# Patient Record
Sex: Male | Born: 1960 | Race: White | Hispanic: No | Marital: Single | Smoking: Current every day smoker
Health system: Southern US, Community
[De-identification: ages and names within clinical notes are randomized; demographics above are authoritative.]

## PROBLEM LIST (undated history)

## (undated) DIAGNOSIS — F32A Depression, unspecified: Secondary | ICD-10-CM

## (undated) DIAGNOSIS — H9190 Unspecified hearing loss, unspecified ear: Secondary | ICD-10-CM

## (undated) DIAGNOSIS — F419 Anxiety disorder, unspecified: Secondary | ICD-10-CM

## (undated) DIAGNOSIS — F329 Major depressive disorder, single episode, unspecified: Secondary | ICD-10-CM

## (undated) DIAGNOSIS — K219 Gastro-esophageal reflux disease without esophagitis: Secondary | ICD-10-CM

## (undated) DIAGNOSIS — K649 Unspecified hemorrhoids: Secondary | ICD-10-CM

---

## 2000-06-19 ENCOUNTER — Encounter: Payer: Self-pay | Admitting: Emergency Medicine

## 2000-06-19 ENCOUNTER — Emergency Department (HOSPITAL_COMMUNITY): Admission: EM | Admit: 2000-06-19 | Discharge: 2000-06-19 | Payer: Self-pay | Admitting: Emergency Medicine

## 2000-06-20 ENCOUNTER — Encounter: Payer: Self-pay | Admitting: Emergency Medicine

## 2003-07-27 HISTORY — PX: OTHER SURGICAL HISTORY: SHX169

## 2011-07-27 HISTORY — PX: OTHER SURGICAL HISTORY: SHX169

## 2015-11-10 ENCOUNTER — Other Ambulatory Visit: Payer: Self-pay | Admitting: Surgery

## 2015-11-10 NOTE — H&P (Signed)
Tommy Page 11/10/2015 1:29 PM Location: [facility] Patient #: [id number] DOB: 1960-11-20 Single / Language: [place] / Race: White Male  History of Present Illness Ardeth Sportsman MD; 11/10/2015 2:00 PM) The patient is a 55 year old male who presents with hemorrhoids. Note for "Hemorrhoids": Patient sent by Dr. Willis Modena with [facility] gastroenterology for consultation concerning chronically prolapsed/prolapsing hemorrhoids.  Active male. History of irregular bowels with occasional constipation and diarrhea. Moisture and drainage on his underwear. Occasional blood as well. Concern by exam and colonoscopy for hemorrhoids no other major abnormalities. Surgical consultation requested to see if more definitive surgical treatment of hemorrhoids of benefit. He notes he's had hemorrhoid issues for many years. Work often makes him traveling a lot. Heart to stay compliant on a low fat high fiber diet. The greasy or richer foods Bother Him. Worsening Heartburn and Reflux. Worsening Loose Stools. He Is Transitioned His Fiber Supplement since He Saw Gastroenterology and His Bowels Are Moving Better but He Still Struggles. Especially with Leakage. Ostomy Pain Recently That Ruled Out Any Major Other Pathologies. Patient's Never Had Any Anorectal Intervention Such As Banding Surgery Treatment. Pads Often Help. Agents like Preparation H or a Prescription Anusol Has Not Really Helped Much.  No personal nor family history of GI/colon cancer, inflammatory bowel disease, irritable bowel syndrome, allergy such as Celiac Sprue, dietary/dairy problems, colitis, ulcers nor gastritis. No recent sick contacts/gastroenteritis. No travel outside the country. No changes in diet. No dysphagia to solids or liquids. No significant heartburn or reflux. No hematochezia, hematemesis, coffee ground emesis. No evidence of prior gastric/peptic ulceration.   Other Problems Fay Records, CMA;  11/10/2015 1:29 PM) Anxiety Disorder Back Pain Gastroesophageal Reflux Disease Hypercholesterolemia  Diagnostic Studies History Fay Records, CMA; 11/10/2015 1:29 PM) Colonoscopy within last year  Allergies Fay Records, CMA; 11/10/2015 1:30 PM) No Known Drug Allergies 11/10/2015  Medication History Fay Records, CMA; 11/10/2015 1:30 PM) Simvastatin (  Tablet, Oral) Active. Omeprazole (  Capsule DR, Oral) Active. Medications Reconciled  Social History Fay Records, [place]; 11/10/2015 1:29 PM) Alcohol use Moderate alcohol use. Caffeine use Carbonated beverages, Coffee. Illicit drug use Remotely quit drug use. Tobacco use Former smoker.  Family History Fay Records, [place]; 11/10/2015 1:29 PM) Alcohol Abuse Father. Arthritis Mother. Colon Cancer Father. Colon Polyps Brother, Mother. Heart Disease Father. Heart disease in male family member before age 71 Hypertension Mother. Melanoma Brother. Prostate Cancer Father.     Review of Systems Fay Records CMA; 11/10/2015 1:29 PM) General Not Present- Appetite Loss, Chills, Fatigue, Fever, Night Sweats, Weight Gain and Weight Loss. Skin Present- Change in Wart/Mole. Not Present- Dryness, Hives, Jaundice, New Lesions, Non-Healing Wounds, Rash and Ulcer. HEENT Not Present- Earache, Hearing Loss, Hoarseness, Nose Bleed, Oral Ulcers, Ringing in the Ears, Seasonal Allergies, Sinus Pain, Sore Throat, Visual Disturbances, Wears glasses/contact lenses and Yellow Eyes. Respiratory Present- Snoring. Not Present- Bloody sputum, Chronic Cough, Difficulty Breathing and Wheezing. Breast Not Present- Breast Mass, Breast Pain, Nipple Discharge and Skin Changes. Cardiovascular Not Present- Chest Pain, Difficulty Breathing Lying Down, Leg Cramps, Palpitations, Rapid Heart Rate, Shortness of Breath and Swelling of Extremities. Gastrointestinal Present- Bloody Stool and Hemorrhoids. Not Present- Abdominal Pain, Bloating, Change in Bowel  Habits, Chronic diarrhea, Constipation, Difficulty Swallowing, Excessive gas, Gets full quickly at meals, Indigestion, Nausea, Rectal Pain and Vomiting. Male Genitourinary Not Present- Blood in Urine, Change in Urinary Stream, Frequency, Impotence, Nocturia, Painful Urination, Urgency and Urine Leakage. Musculoskeletal Not Present- Back Pain, Joint Pain, Joint Stiffness, Muscle Pain, Muscle  Weakness and Swelling of Extremities. Neurological Not Present- Decreased Memory, Fainting, Headaches, Numbness, Seizures, Tingling, Tremor, Trouble walking and Weakness. Psychiatric Present- Anxiety. Not Present- Bipolar, Change in Sleep Pattern, Depression, Fearful and Frequent crying. Endocrine Not Present- Cold Intolerance, Excessive Hunger, Hair Changes, Heat Intolerance, Hot flashes and New Diabetes. Hematology Not Present- Easy Bruising, Excessive bleeding, Gland problems, HIV and Persistent Infections.  Vitals Fay Records CMA; 11/10/2015 1:31 PM) 11/10/2015 1:30 PM Weight: 174.8 lb Height: 70in Body Surface Area: 1.97 m Body Mass Index: 25.08 kg/m  Temp.: 97.67F  Pulse: 83 (Regular)  BP: 130/72 (Sitting, Left Arm, Standard)      Physical Exam Ardeth Sportsman MD; 11/10/2015 2:01 PM)  General Mental Status-Alert. General Appearance-Not in acute distress, Not Sickly. Orientation-Oriented X3. Hydration-Well hydrated. Voice-Normal.  Integumentary Global Assessment Upon inspection and palpation of skin surfaces of the - Axillae: non-tender, no inflammation or ulceration, no drainage. and Distribution of scalp and body hair is normal. General Characteristics Temperature - normal warmth is noted.  Head and Neck Head-normocephalic, atraumatic with no lesions or palpable masses. Face Global Assessment - atraumatic, no absence of expression. Neck Global Assessment - no abnormal movements, no bruit auscultated on the right, no bruit auscultated on the left, no decreased  range of motion, non-tender. Trachea-midline. Thyroid Gland Characteristics - non-tender.  Eye Eyeball - Left-Extraocular movements intact, No Nystagmus. Eyeball - Right-Extraocular movements intact, No Nystagmus. Cornea - Left-No Hazy. Cornea - Right-No Hazy. Sclera/Conjunctiva - Left-No scleral icterus, No Discharge. Sclera/Conjunctiva - Right-No scleral icterus, No Discharge. Pupil - Left-Direct reaction to light normal. Pupil - Right-Direct reaction to light normal.  ENMT Ears Pinna - Left - no drainage observed, no generalized tenderness observed. Right - no drainage observed, no generalized tenderness observed. Nose and Sinuses External Inspection of the Nose - no destructive lesion observed. Inspection of the nares - Left - quiet respiration. Right - quiet respiration. Mouth and Throat Lips - Upper Lip - no fissures observed, no pallor noted. Lower Lip - no fissures observed, no pallor noted. Nasopharynx - no discharge present. Oral Cavity/Oropharynx - Tongue - no dryness observed. Oral Mucosa - no cyanosis observed. Hypopharynx - no evidence of airway distress observed. Note: Hard of hearing  Chest and Lung Exam Inspection Movements - Normal and Symmetrical. Accessory muscles - No use of accessory muscles in breathing. Palpation Palpation of the chest reveals - Non-tender. Auscultation Breath sounds - Normal and Clear.  Cardiovascular Auscultation Rhythm - Regular. Murmurs & Other Heart Sounds - Auscultation of the heart reveals - No Murmurs and No Systolic Clicks.  Abdomen Inspection Inspection of the abdomen reveals - No Visible peristalsis and No Abnormal pulsations. Umbilicus - No Bleeding, No Urine drainage. Palpation/Percussion Palpation and Percussion of the abdomen reveal - Soft, Non Tender, No Rebound tenderness, No Rigidity (guarding) and No Cutaneous hyperesthesia. Note: Abdomen soft and flat. No diastases. No umbilical hernia.  Nontender. Nondistended.  Male Genitourinary Sexual Maturity Tanner 5 - Adult hair pattern and Adult penile size and shape. Note: Normal external genitalia. Epididymi, testes, and spermatic cords normal without any masses. No inguinal hernias.  Rectal Note: External hemorrhoids: Left lateral greater than right posterior partially prolapsed piles.  Hemorrhoidal piles: Enlarged right anterior grade 3 hemorrhoid easily prolapses. Circumferential irritation of the anal canal. No rectal masses. No condyloma.    Exam done with assistance of male Medical Assistant in the room. Perianal skin clean with good hygiene. No pruritis ani. No pilonidal disease. No fissure. No abscess/fistula. Normal sphincter  tone. Tolerates digital and anoscopic rectal exam. No rectal masses. Prostate smooth and not enlarged. No discrete masses.  Peripheral Vascular Upper Extremity Inspection - Left - No Cyanotic nailbeds, Not Ischemic. Right - No Cyanotic nailbeds, Not Ischemic.  Neurologic Neurologic evaluation reveals -normal attention span and ability to concentrate, able to name objects and repeat phrases. Appropriate fund of knowledge , normal sensation and normal coordination. Mental Status Affect - not angry, not paranoid. Cranial Nerves-Normal Bilaterally. Gait-Normal.  Neuropsychiatric Mental status exam performed with findings of-able to articulate well with normal speech/language, rate, volume and coherence, thought content normal with ability to perform basic computations and apply abstract reasoning and no evidence of hallucinations, delusions, obsessions or homicidal/suicidal ideation.  Musculoskeletal Global Assessment Spine, Ribs and Pelvis - no instability, subluxation or laxity. Right Upper Extremity - no instability, subluxation or laxity.  Lymphatic Head & Neck  General Head & Neck Lymphatics: Bilateral - Description - No Localized lymphadenopathy. Axillary  General Axillary  Region: Bilateral - Description - No Localized lymphadenopathy. Femoral & Inguinal  Generalized Femoral & Inguinal Lymphatics: Left - Description - No Localized lymphadenopathy. Right - Description - No Localized lymphadenopathy.   Results Ardeth Sportsman(Dilan Fullenwider C. Satori Krabill MD; 11/10/2015 1:59 PM) Procedures  Name Value Date Hemorrhoids Procedure Anal exam: External Hemorrhoid Bleeding Internal exam: Internal Hemorroids ( non-bleeding) prolapse Other: External hemorrhoids: Left lateral greater than right posterior partially prolapsed piles. Hemorrhoidal piles: Enlarged right anterior grade 3 hemorrhoid easily prolapses. Circumferential irritation of the anal canal. No rectal masses. No condyloma. Exam done with assistance of male Medical Assistant in the room. Perianal skin clean with good hygiene. No pruritis ani. No pilonidal disease. No fissure. No abscess/fistula. Normal sphincter tone. Tolerates digital and anoscopic rectal exam. No rectal masses. Prostate smooth and not enlarged. No discrete masses.  Performed: 11/10/2015 1:49 PM    Assessment & Plan Ardeth Sportsman(Carnie Bruemmer C. 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  I think this is too for long to treat with just injections or banding. Requires hemorrhoidal ligation and possible hemorrhoidectomy. Outpatient surgery. He is interested in proceeding.  I strongly recommend he continue on his fiber bowel regimen. He notes when he can stay compliant on that, it helps a lot. He is trying to get away from heavier greasier foods. [place] I can get his anatomy better with surgery, less likely to have any hemorrhoid problems in the future. He is motivated.  Current Plans ANOSCOPY, DIAGNOSTIC ([id number]) Pt Education  - [facility] Hemorrhoids (Reggie Welge): discussed with patient and provided information. Pt Education - Pamphlet Given - The Hemorrhoid Book: discussed with patient and provided information. You are being scheduled for surgery - Our schedulers will call you.  You should hear from our office's scheduling department within 5 working days about the location, date, and time of surgery. We try to make accommodations for patient's preferences in scheduling surgery, but sometimes the OR schedule or the surgeon's schedule prevents us from making those accommodations.  If you have not heard from our office [phone]) in 5 working days, call the office and ask for your surgeon's nurse.  If you have other questions about your diagnosis, plan, or surgery, call the office and ask for your surgeon's nurse.  The anatomy & physiology of the anorectal region was discussed. The pathophysiology of hemorrhoids and differential diagnosis was discussed.  Natural history risks without surgery was discussed. I stressed the importance of a bowel regimen to have daily soft bowel movements to minimize progression of disease. Interventions such as sclerotherapy & banding were discussed.  The patient's symptoms are not adequately controlled by medicines and other non-operative treatments. I feel the risks & problems of no surgery outweigh the operative risks; therefore, I recommended surgery to treat the hemorrhoids by ligation, pexy, and possible resection.  Risks such as bleeding, infection, urinary difficulties, need for further treatment, heart attack, death, and other risks were discussed. I noted a good likelihood this will help address the problem. Goals of post-operative recovery were discussed as well. Possibility that this will not correct all symptoms was explained. Post-operative pain, bleeding, constipation, and other problems after surgery were discussed. We will work to minimize complications. Educational handouts further  explaining the pathology, treatment options, and bowel regimen were given as well. Questions were answered. The patient expresses understanding & wishes to proceed with surgery.  Pt Education - [facility] Rectal Prep for Anorectal outpatient/office surgery: discussed with patient and provided information. Pt Education - [facility] Rectal Surgery HCI (Kael Keetch): discussed with patient and provided information.  Ardeth Sportsman, M.D., F.A.C.S. Gastrointestinal and Minimally Invasive Surgery [facility] [address], [address] [place], [place] [address] [phone] Main / Paging

## 2015-11-27 ENCOUNTER — Encounter (HOSPITAL_COMMUNITY)
Admission: RE | Admit: 2015-11-27 | Discharge: 2015-11-27 | Disposition: A | Discharge status: Home / Self Care | Payer: BLUE CROSS/BLUE SHIELD | Source: Ambulatory Visit | Attending: Surgery | Admitting: Surgery

## 2015-11-27 ENCOUNTER — Encounter (HOSPITAL_COMMUNITY): Payer: Self-pay

## 2015-11-27 DIAGNOSIS — Z01812 Encounter for preprocedural laboratory examination: Secondary | ICD-10-CM | POA: Insufficient documentation

## 2015-11-27 DIAGNOSIS — K643 Fourth degree hemorrhoids: Secondary | ICD-10-CM | POA: Diagnosis present

## 2015-11-27 HISTORY — DX: Gastro-esophageal reflux disease without esophagitis: K21.9

## 2015-11-27 HISTORY — DX: Unspecified hearing loss, unspecified ear: H91.90

## 2015-11-27 HISTORY — DX: Anxiety disorder, unspecified: F41.9

## 2015-11-27 HISTORY — DX: Major depressive disorder, single episode, unspecified: F32.9

## 2015-11-27 HISTORY — DX: Depression, unspecified: F32.A

## 2015-11-27 HISTORY — DX: Unspecified hemorrhoids: K64.9

## 2015-11-27 LAB — HEMOGLOBIN: Hemoglobin: 15.4 g/dL (ref 13.0–17.0)

## 2015-11-27 NOTE — Patient Instructions (Addendum)
Tommy ParishVincent Page  11/27/2015   Your procedure is scheduled on:   [facility]  [facility] to 3rd floor to  Short Stay Center at 1000 AM.  Call this number if you have problems the morning of surgery [phone]   Remember: ONLY 1 PERSON MAY GO WITH YOU TO SHORT STAY TO GET  READY MORNING OF YOUR SURGERY.  Do not eat food or drink liquids :After Midnight.PER DR GROSS DAY BEFORE SURGERY 12-02-15 SWITCH TO DRINKING LIQUIDS OR PUREED FOODS ONLY,  AT 100PM TAKE 2 OUNCES (4 TABLESPOONS) OF MILK OF MAGNESIA. AT 300 PM TAKE 2 OUNCES (4 TABLESPOONS MILK OF MAGNESIA).       Take these medicines the morning of surgery with A SIP OF WATER: OMEPRAZOLE             You may not have any metal on your body including hair pins and              piercings  Do not wear jewelry, make-up, lotions, powders or perfumes, deodorant             Do not wear nail polish.  Do not shave  48 hours prior to surgery.              Men may shave face and neck.   Do not bring valuables to the hospital. [name] IS NOT             RESPONSIBLE   FOR VALUABLES.  Contacts, dentures or bridgework may not be worn into surgery.  Leave suitcase in the car. After surgery it may be brought to your room.     Patients discharged the day of surgery will not be allowed to drive home.  Name and phone number of your driver:LORI [facility] CE[facility] [phone] OR DAVID Cheryle HorsfallLEVEQUE FRIEND CELL [phone]  Special Instructions: N/A              Please read over the following fact sheets you were given: _____________________________________________________________________             [facility] - Pre[facility] Before surgery, you can play an important role.  Because skin is not sterile, your skin needs to be as free of germs as possible.  You can reduce the number of germs on your skin by washing with CHG (chlorahexidine gluconate) soap before surgery.  CHG is an antiseptic cleaner  which kills germs and bonds with the skin to continue killing germs even after washing. Please DO NOT use if you have an allergy to CHG or antibacterial soaps.  If your skin becomes reddened/irritated stop using the CHG and inform your nurse when you arrive at Short Stay. Do not shave (including legs and underarms) for at least 48 hours prior to the first CHG shower.  You may shave your face/neck. Please follow these instructions carefully:  1.  Shower with CHG Soap the night before surgery and the  morning of Surgery.  2.  If you choose to wash your hair, wash your hair first as usual with your  normal  shampoo.  3.  After you shampoo, rinse your hair and body thoroughly to remove the  shampoo.                           4.  Use CHG as you  would any other liquid soap.  You can apply chg directly  to the skin and wash                       Gently with a scrungie or clean washcloth.  5.  Apply the CHG Soap to your body ONLY FROM THE NECK DOWN.   Do not use on face/ open                           Wound or open sores. Avoid contact with eyes, ears mouth and genitals (private parts).                       Wash face,  Genitals (private parts) with your normal soap.             6.  Wash thoroughly, paying special attention to the area where your surgery  will be performed.  7.  Thoroughly rinse your body with warm water from the neck down.  8.  DO NOT shower/wash with your normal soap after using and rinsing off  the CHG Soap.                9.  Pat yourself dry with a clean towel.            10.  Wear clean pajamas.            11.  Place clean sheets on your bed the night of your first shower and do not  sleep with pets. Day of Surgery : Do not apply any lotions/deodorants the morning of surgery.  Please wear clean clothes to the hospital/surgery center.  FAILURE TO FOLLOW THESE INSTRUCTIONS MAY RESULT IN THE CANCELLATION OF YOUR SURGERY PATIENT SIGNATURE_________________________________  NURSE  SIGNATURE__________________________________  ________________________________________________________________________

## 2015-11-27 NOTE — Progress Notes (Signed)
CMET, LIPID PANEL 11-20-15 [place] ON CHART

## 2015-12-03 ENCOUNTER — Ambulatory Visit (HOSPITAL_COMMUNITY): Payer: BLUE CROSS/BLUE SHIELD | Admitting: Certified Registered"

## 2015-12-03 ENCOUNTER — Encounter (HOSPITAL_COMMUNITY)
Admission: RE | Disposition: A | Discharge status: Home / Self Care | Payer: Self-pay | Source: Ambulatory Visit | Attending: Surgery

## 2015-12-03 ENCOUNTER — Encounter (HOSPITAL_COMMUNITY): Payer: Self-pay | Admitting: *Deleted

## 2015-12-03 ENCOUNTER — Ambulatory Visit (HOSPITAL_COMMUNITY)
Admission: RE | Admit: 2015-12-03 | Discharge: 2015-12-03 | Disposition: A | Discharge status: Home / Self Care | Payer: BLUE CROSS/BLUE SHIELD | Source: Ambulatory Visit | Attending: Surgery | Admitting: Surgery

## 2015-12-03 DIAGNOSIS — K643 Fourth degree hemorrhoids: Secondary | ICD-10-CM | POA: Diagnosis not present

## 2015-12-03 DIAGNOSIS — K648 Other hemorrhoids: Secondary | ICD-10-CM | POA: Insufficient documentation

## 2015-12-03 DIAGNOSIS — K642 Third degree hemorrhoids: Secondary | ICD-10-CM | POA: Insufficient documentation

## 2015-12-03 DIAGNOSIS — K219 Gastro-esophageal reflux disease without esophagitis: Secondary | ICD-10-CM | POA: Diagnosis not present

## 2015-12-03 DIAGNOSIS — E7800 Pure hypercholesterolemia, unspecified: Secondary | ICD-10-CM | POA: Insufficient documentation

## 2015-12-03 DIAGNOSIS — Z87891 Personal history of nicotine dependence: Secondary | ICD-10-CM | POA: Insufficient documentation

## 2015-12-03 DIAGNOSIS — Z79899 Other long term (current) drug therapy: Secondary | ICD-10-CM | POA: Diagnosis not present

## 2015-12-03 HISTORY — PX: TRANSANAL HEMORRHOIDAL DEARTERIALIZATION: SHX6136

## 2015-12-03 SURGERY — TRANSANAL HEMORRHOIDAL DEARTERIALIZATION
Anesthesia: General | Site: Rectum

## 2015-12-03 MED ORDER — HYDROCORTISONE 2.5 % RE CREA: 1.0000 "application " | TOPICAL_CREAM | Freq: Two times a day (BID) | RECTAL | Status: AC

## 2015-12-03 MED ORDER — KETOROLAC TROMETHAMINE 30 MG/ML IJ SOLN
INTRAMUSCULAR | Status: AC
  Filled 2015-12-03: qty 1

## 2015-12-03 MED ORDER — 0.9 % SODIUM CHLORIDE (POUR BTL) OPTIME
TOPICAL | Status: DC | PRN
  Administered 2015-12-03: 1000 mL

## 2015-12-03 MED ORDER — LIDOCAINE HCL (CARDIAC) 20 MG/ML IV SOLN
INTRAVENOUS | Status: DC | PRN
  Administered 2015-12-03: 100 mg via INTRAVENOUS

## 2015-12-03 MED ORDER — ROCURONIUM BROMIDE 100 MG/10ML IV SOLN
INTRAVENOUS | Status: DC | PRN
  Administered 2015-12-03: 50 mg via INTRAVENOUS

## 2015-12-03 MED ORDER — MIDAZOLAM HCL 2 MG/2ML IJ SOLN
INTRAMUSCULAR | Status: AC
  Filled 2015-12-03: qty 2

## 2015-12-03 MED ORDER — DEXAMETHASONE SODIUM PHOSPHATE 10 MG/ML IJ SOLN
INTRAMUSCULAR | Status: AC
  Filled 2015-12-03: qty 1

## 2015-12-03 MED ORDER — LIDOCAINE HCL (CARDIAC) 20 MG/ML IV SOLN
INTRAVENOUS | Status: AC
  Filled 2015-12-03: qty 5

## 2015-12-03 MED ORDER — BUPIVACAINE-EPINEPHRINE 0.25% -1:200000 IJ SOLN
INTRAMUSCULAR | Status: AC
  Filled 2015-12-03: qty 1

## 2015-12-03 MED ORDER — OXYCODONE HCL 5 MG/5ML PO SOLN
5.0000 mg | Freq: Once | ORAL | Status: AC | PRN
  Filled 2015-12-03: qty 5

## 2015-12-03 MED ORDER — FENTANYL CITRATE (PF) 100 MCG/2ML IJ SOLN
INTRAMUSCULAR | Status: DC | PRN
  Administered 2015-12-03 (×2): 50 ug via INTRAVENOUS
  Administered 2015-12-03: 100 ug via INTRAVENOUS

## 2015-12-03 MED ORDER — ROCURONIUM BROMIDE 50 MG/5ML IV SOLN
INTRAVENOUS | Status: AC
  Filled 2015-12-03: qty 1

## 2015-12-03 MED ORDER — PROPOFOL 10 MG/ML IV BOLUS
INTRAVENOUS | Status: DC | PRN
  Administered 2015-12-03: 200 mg via INTRAVENOUS

## 2015-12-03 MED ORDER — MIDAZOLAM HCL 5 MG/5ML IJ SOLN
INTRAMUSCULAR | Status: DC | PRN
  Administered 2015-12-03: 2 mg via INTRAVENOUS

## 2015-12-03 MED ORDER — PROPOFOL 10 MG/ML IV BOLUS
INTRAVENOUS | Status: AC
  Filled 2015-12-03: qty 20

## 2015-12-03 MED ORDER — ONDANSETRON HCL 4 MG/2ML IJ SOLN: 4.0000 mg | Freq: Once | INTRAMUSCULAR | Status: DC | PRN

## 2015-12-03 MED ORDER — DEXAMETHASONE SODIUM PHOSPHATE 10 MG/ML IJ SOLN
INTRAMUSCULAR | Status: DC | PRN
  Administered 2015-12-03: 10 mg via INTRAVENOUS

## 2015-12-03 MED ORDER — BUPIVACAINE LIPOSOME 1.3 % IJ SUSP
INTRAMUSCULAR | Status: DC | PRN
  Administered 2015-12-03: 20 mL

## 2015-12-03 MED ORDER — CHLORHEXIDINE GLUCONATE 4 % EX LIQD: 1.0000 "application " | Freq: Once | CUTANEOUS | Status: DC

## 2015-12-03 MED ORDER — DIBUCAINE 1 % RE OINT
TOPICAL_OINTMENT | RECTAL | Status: AC
  Filled 2015-12-03: qty 28

## 2015-12-03 MED ORDER — METRONIDAZOLE IN NACL 5-0.79 MG/ML-% IV SOLN
500.0000 mg | INTRAVENOUS | Status: AC
  Administered 2015-12-03: 500 mg via INTRAVENOUS

## 2015-12-03 MED ORDER — ONDANSETRON HCL 4 MG/2ML IJ SOLN
INTRAMUSCULAR | Status: DC | PRN
  Administered 2015-12-03 (×4): 2 mg via INTRAVENOUS

## 2015-12-03 MED ORDER — SUGAMMADEX SODIUM 200 MG/2ML IV SOLN
INTRAVENOUS | Status: AC
  Filled 2015-12-03: qty 2

## 2015-12-03 MED ORDER — FENTANYL CITRATE (PF) 100 MCG/2ML IJ SOLN
INTRAMUSCULAR | Status: AC
  Filled 2015-12-03: qty 2

## 2015-12-03 MED ORDER — LACTATED RINGERS IV SOLN
INTRAVENOUS | Status: DC
  Administered 2015-12-03 (×2): via INTRAVENOUS

## 2015-12-03 MED ORDER — FENTANYL CITRATE (PF) 100 MCG/2ML IJ SOLN
25.0000 ug | INTRAMUSCULAR | Status: DC | PRN
  Administered 2015-12-03 (×2): 50 ug via INTRAVENOUS

## 2015-12-03 MED ORDER — CEFAZOLIN SODIUM-DEXTROSE 2-4 GM/100ML-% IV SOLN
INTRAVENOUS | Status: AC
  Filled 2015-12-03: qty 100

## 2015-12-03 MED ORDER — CEFAZOLIN SODIUM-DEXTROSE 2-4 GM/100ML-% IV SOLN
2.0000 g | INTRAVENOUS | Status: AC
  Administered 2015-12-03: 2 g via INTRAVENOUS

## 2015-12-03 MED ORDER — SUGAMMADEX SODIUM 200 MG/2ML IV SOLN
INTRAVENOUS | Status: DC | PRN
  Administered 2015-12-03: 150 mg via INTRAVENOUS

## 2015-12-03 MED ORDER — METRONIDAZOLE IN NACL 5-0.79 MG/ML-% IV SOLN
INTRAVENOUS | Status: AC
  Filled 2015-12-03: qty 100

## 2015-12-03 MED ORDER — ONDANSETRON HCL 4 MG/2ML IJ SOLN
INTRAMUSCULAR | Status: AC
  Filled 2015-12-03: qty 4

## 2015-12-03 MED ORDER — OXYCODONE HCL 5 MG PO TABS
5.0000 mg | ORAL_TABLET | Freq: Once | ORAL | Status: AC | PRN
  Administered 2015-12-03: 5 mg via ORAL
  Filled 2015-12-03: qty 1

## 2015-12-03 MED ORDER — DIBUCAINE 1 % RE OINT
TOPICAL_OINTMENT | RECTAL | Status: DC | PRN
  Administered 2015-12-03: 1 via RECTAL

## 2015-12-03 MED ORDER — BUPIVACAINE LIPOSOME 1.3 % IJ SUSP
20.0000 mL | INTRAMUSCULAR | Status: DC
  Filled 2015-12-03: qty 20

## 2015-12-03 MED ORDER — KETOROLAC TROMETHAMINE 30 MG/ML IJ SOLN
INTRAMUSCULAR | Status: DC | PRN
  Administered 2015-12-03: 30 mg via INTRAVENOUS

## 2015-12-03 MED ORDER — OXYCODONE HCL 5 MG PO TABS: 5.0000 mg | ORAL_TABLET | ORAL | Status: DC | PRN

## 2015-12-03 SURGICAL SUPPLY — 30 items
BLADE SURG 15 STRL LF DISP TIS (BLADE) IMPLANT
BLADE SURG 15 STRL SS (BLADE) ×2
BRIEF STRETCH FOR OB PAD LRG (UNDERPADS AND DIAPERS) ×2 IMPLANT
COVER SURGICAL LIGHT HANDLE (MISCELLANEOUS) ×2 IMPLANT
DECANTER SPIKE VIAL GLASS SM (MISCELLANEOUS) ×2 IMPLANT
DRAPE LAPAROTOMY T 102X78X121 (DRAPES) ×2 IMPLANT
DRSG PAD ABDOMINAL 8X10 ST (GAUZE/BANDAGES/DRESSINGS) ×1 IMPLANT
ELECT PENCIL ROCKER SW 15FT (MISCELLANEOUS) ×1 IMPLANT
ELECT REM PT RETURN 9FT ADLT (ELECTROSURGICAL) ×2
ELECTRODE REM PT RTRN 9FT ADLT (ELECTROSURGICAL) ×1 IMPLANT
GAUZE SPONGE 4X4 12PLY STRL (GAUZE/BANDAGES/DRESSINGS) ×1 IMPLANT
GAUZE SPONGE 4X4 16PLY XRAY LF (GAUZE/BANDAGES/DRESSINGS) ×2 IMPLANT
GLOVE BIOGEL PI IND STRL 8 (GLOVE) ×1 IMPLANT
GLOVE BIOGEL PI INDICATOR 8 (GLOVE) ×1
GLOVE ECLIPSE 8.0 STRL XLNG CF (GLOVE) ×2 IMPLANT
GOWN STRL REUS W/TWL XL LVL3 (GOWN DISPOSABLE) ×5 IMPLANT
KIT BASIN OR (CUSTOM PROCEDURE TRAY) ×2 IMPLANT
KIT SLIDE ONE PROLAPS HEMORR (KITS) ×2 IMPLANT
LUBRICANT JELLY K Y 4OZ (MISCELLANEOUS) ×2 IMPLANT
NEEDLE HYPO 22GX1.5 SAFETY (NEEDLE) ×2 IMPLANT
PACK BASIC VI WITH GOWN DISP (CUSTOM PROCEDURE TRAY) ×2 IMPLANT
PAD ABD 8X10 STRL (GAUZE/BANDAGES/DRESSINGS) ×1 IMPLANT
SPONGE LAP 18X18 X RAY DECT (DISPOSABLE) ×1 IMPLANT
SUT CHROMIC 3 0 SH 27 (SUTURE) ×1 IMPLANT
SUT VIC AB 2-0 UR6 27 (SUTURE) IMPLANT
SYR 20CC LL (SYRINGE) ×2 IMPLANT
SYR BULB IRRIGATION 50ML (SYRINGE) ×1 IMPLANT
TOWEL OR 17X26 10 PK STRL BLUE (TOWEL DISPOSABLE) ×2 IMPLANT
TOWEL OR NON WOVEN STRL DISP B (DISPOSABLE) ×2 IMPLANT
YANKAUER SUCT BULB TIP 10FT TU (MISCELLANEOUS) ×2 IMPLANT

## 2015-12-03 NOTE — Transfer of Care (Signed)
Immediate Anesthesia Transfer of Care Note  Patient: Tommy Page  Procedure(s) Performed: Procedure(s): TRANSANAL HEMORRHOIDAL DEARTERIALIZATION LIGATION/PEXY  EUA HEMORRHOIDECTOMY  (N/A)  Patient Location: PACU  Anesthesia Type:General  Level of Consciousness:  sedated, patient cooperative and responds to stimulation  Airway & Oxygen Therapy:Patient Spontanous Breathing and Patient connected to face mask oxgen  Post-op Assessment:  Report given to PACU RN and Post -op Vital signs reviewed and stable  Post vital signs:  Reviewed and stable  Last Vitals:  Filed Vitals:   12/03/15 0957  BP: 116/81  Pulse: 64  Temp: 36.5 C  Resp: 18    Complications: No apparent anesthesia complications

## 2015-12-03 NOTE — Anesthesia Procedure Notes (Signed)
Procedure Name: Intubation Date/Time: 12/03/2015 1:08 PM Performed by: Army FossaPULLIAM, Marlo Arriola DANE Pre-anesthesia Checklist: Patient identified, Suction available, Emergency Drugs available, Patient being monitored and Timeout performed Patient Re-evaluated:Patient Re-evaluated prior to inductionOxygen Delivery Method: Circle system utilized Preoxygenation: Pre-oxygenation with 100% oxygen Intubation Type: IV induction Ventilation: Mask ventilation without difficulty and Oral airway inserted - appropriate to patient size Laryngoscope Size: Mac and 4 Grade View: Grade I Tube type: Oral Number of attempts: 1 Airway Equipment and Method: Patient positioned with wedge pillow and Stylet Placement Confirmation: ETT inserted through vocal cords under direct vision,  positive ETCO2,  CO2 detector and breath sounds checked- equal and bilateral Secured at: 23 cm Tube secured with: Tape Dental Injury: Teeth and Oropharynx as per pre-operative assessment

## 2015-12-03 NOTE — Op Note (Signed)
12/03/2015  2:02 PM  PATIENT:  Loletta ParishVincent Stoiber  55 y.o. male  Patient Care Team: Farris HasAaron Morrow, MD as PCP - General (Family Medicine) Karie SodaSteven Iliyana Convey, MD as Consulting Physician (General Surgery) Willis ModenaWilliam Outlaw, MD as Consulting Physician (Gastroenterology)  PRE-OPERATIVE DIAGNOSIS:  Grade 3 and 4 prolapsed hemorrhoids with drainage, pain and bleeding   POST-OPERATIVE DIAGNOSIS:  Grade 3 and 4 prolapsed hemorrhoids with drainage, pain and bleeding   PROCEDURE:  Procedure(s): TRANSANAL HEMORRHOIDAL DEARTERIALIZATION LIGATION/PEXY Transanal examination under anesthesia.  External hemorrhoidectomy 3    SURGEON:  Surgeon(s): Karie SodaSteven Love Chowning, MD  ASSISTANT: none   ANESTHESIA:   Local field block Anorectal block General  0.25% bupivacaine with epinephrine at the beginning of the case.  Liposomal bupivacaine (Experel) at the end of the case.  EBL:  Total I/O In: 1000 [I.V.:1000] Out: 75 [Blood:75]  Delay start of Pharmacological VTE agent (>24hrs) due to surgical blood loss or risk of bleeding:  no  DRAINS: none   SPECIMEN:  No Specimen and Source of Specimen:  External hemorrhoidal tags.  (Right anterior, right posterior, left lateral)  DISPOSITION OF SPECIMEN:  PATHOLOGY  COUNTS:  YES  PLAN OF CARE: Discharge to home after PACU  PATIENT DISPOSITION:  PACU - hemodynamically stable.  INDICATION: Pleasant active male struggling with worsening hemorrhoid problems.  Chronically prolapsed.  Felt not to be amenable to office intervention given the significant prolapse.  I recommended examination under anesthesia.  Hemorrhoidal ligation and pexy using THD system.  Probable external hemorrhoidectomy of remaining tissues.  The anatomy & physiology of the anorectal region was discussed.  The pathophysiology of hemorrhoids and differential diagnosis was discussed.  Natural history risks without surgery was discussed.   I stressed the importance of a bowel regimen to have daily soft  bowel movements to minimize progression of disease.  Interventions such as sclerotherapy & banding were discussed.  The patient's symptoms are not adequately controlled by medicines and other non-operative treatments.  I feel the risks & problems of no surgery outweigh the operative risks; therefore, I recommended surgery to treat the hemorrhoids by ligation, pexy, and possible resection.  Risks such as bleeding, infection, urinary difficulties, need for further treatment, heart attack, death, and other risks were discussed.   I noted a good likelihood this will help address the problem.  Goals of post-operative recovery were discussed as well.  Possibility that this will not correct all symptoms was explained.  Post-operative pain, bleeding, constipation, and other problems after surgery were discussed.  We will work to minimize complications.   Educational handouts further explaining the pathology, treatment options, and bowel regimen were given as well.  Questions were answered.  The patient expresses understanding & wishes to proceed with surgery.   OR FINDINGS: Had chronically prolapsed grade 4 hemorrhoids 3 piles.  Right anterior, right posterior, left lateral.  Internal hemorrhoids ligated and pexy.  Remaining external hemorrhoidal sections removed.  DESCRIPTION:   Informed consent was confirmed. Patient underwent general anesthesia without difficulty. Patient was placed into prone positioning.  The perianal region was prepped and draped in sterile fashion. Surgical time-out confirmed our plan.  I did digital rectal examination and then transitioned over to anoscopy to get a sense of the anatomy.  I switched over to the [facility] fiber[facility] lit Doppler anocope.   Using the Doppler on the tip of the THD anoscope, I identified the arterial hemorrhoidal vessels coming in in the classic hexagonal anatomical pattern  (right posterior/lateral/anterior, left posterior /lateral/anterior).    I  proceeded to ligate the hemorrhoidal arteries. I used a 2-0 Vicryl suture on a UR-6 needle in a figure-of-eight fashion over the signal around 6 cm proximal to the anal verge. I then ran that stitch longitudinally more distally to the white line of [place]. I then tied that stitch down to cause a hemorrhoidopexy. I did that for all 6 locations.    I redid Doppler anoscopy.  Signals had gone away.  There is no more prolapsing.  However there were still some external hemorrhoidal tissue.  I excised them in all 3 locations in a fusiform radial/longitudinal fashion.  I closed those wounds over a large Hill-Ferguson retractor with running 4-0 chromic suture, leaving a few millimeters opened distally to let it drain.  I repeated anoscopy and examination.  Hemostasis was good.  There was no stricturing.  There is no necrosis.  Patient is being extubated go to recovery room.  I had discussed postop care in detail with the patient in the preop holding area.  Instructions are written.  There is no family currently available at this time.  I will try again later.      Ardeth Sportsman, M.D., F.A.C.S. Gastrointestinal and Minimally Invasive Surgery [facility] [address] [place], [place] [address] [phone] Main / Paging

## 2015-12-03 NOTE — Progress Notes (Addendum)
Patient ambulated from 1301 to 1309 after rectal surgery. Tolerated well.  1650   Patient up to bathroom several times and unable to void. Bladder scanned for 298 ml. RN informs patient that he should be able to feel the sensation to void soon.

## 2015-12-03 NOTE — Discharge Instructions (Signed)
ANORECTAL SURGERY:  POST OPERATIVE INSTRUCTIONS  1. Take your usually prescribed home medications unless otherwise directed. 2. DIET: Follow a light bland diet the first 24 hours after arrival home, such as soup, liquids, crackers, etc.  Be sure to include lots of fluids daily.  Avoid fast food or heavy meals as your are more likely to get nauseated.  Eat a low fat the next few days after surgery.   3. PAIN CONTROL: a. Pain is best controlled by a usual combination of three different methods TOGETHER: i. Ice/Heat ii. Over the counter pain medication iii. Prescription pain medication b. Most patients will experience some swelling and discomfort in the anus/rectal area. and incisions.  Ice packs or heat (30-60 minutes up to 6 times a day) will help. Use ice for the first few days to help decrease swelling and bruising, then switch to heat such as warm towels, sitz baths, warm baths, etc to help relax tight/sore spots and speed recovery.  Some people prefer to use ice alone, heat alone, alternating between ice & heat.  Experiment to what works for you.  Swelling and bruising can take several weeks to resolve.   c. It is helpful to take an over-the-counter pain medication regularly for the first few weeks.  Choose one of the following that works best for you: i. Naproxen (Aleve, etc)  Two '220mg'$  tabs twice a day ii. Ibuprofen (Advil, etc) Three '200mg'$  tabs four times a day (every meal & bedtime) iii. Acetaminophen (Tylenol, etc) 500-'650mg'$  four times a day (every meal & bedtime) d. A  prescription for pain medication (such as oxycodone, hydrocodone, etc) should be given to you upon discharge.  Take your pain medication as prescribed.  i. If you are having problems/concerns with the prescription medicine (does not control pain, nausea, vomiting, rash, itching, etc), please call us [phone] to see if we need to switch you to a different pain medicine that will work better for you and/or control your  side effect better. ii. If you need a refill on your pain medication, please contact your pharmacy.  They will contact our office to request authorization. Prescriptions will not be filled after 5 pm or on week-ends.  Use a Sitz Bath 4-8 times a day for relief   [employer] A sitz bath is a warm water bath taken in the sitting position that covers only the hips and buttocks. It may be used for either healing or hygiene purposes. Sitz baths are also used to relieve pain, itching, or muscle spasms. The water may contain medicine. Moist heat will help you heal and relax.  HOME CARE INSTRUCTIONS  Take 3 to 4 sitz baths a day.  Fill the bathtub half full with warm water.  Sit in the water and open the drain a little.  Turn on the warm water to keep the tub half full. Keep the water running constantly.  Soak in the water for 15 to 20 minutes.  After the sitz bath, pat the affected area dry first.   4. KEEP YOUR BOWELS REGULAR a. The goal is one bowel movement a day b. Avoid getting constipated.  Between the surgery and the pain medications, it is common to experience some constipation.  Increasing fluid intake and taking a fiber supplement (such as Metamucil, Citrucel, FiberCon, MiraLax, etc) 1-2 times a day regularly will usually help prevent this problem from occurring.  A mild laxative (prune juice, Milk of Magnesia, MiraLax, etc) should be taken according to package  directions if there are no bowel movements after 48 hours. c. Watch out for diarrhea.  If you have many loose bowel movements, simplify your diet to bland foods & liquids for a few days.  Stop any stool softeners and decrease your fiber supplement.  Switching to mild anti-diarrheal medications (Kayopectate, Pepto Bismol) can help.  If this worsens or does not improve, please call us.  5. Wound Care  a. Remove your bandages the day after surgery.  Unless discharge instructions indicate otherwise, leave your bandage dry and in  place overnight.  Remove the bandage during your first bowel movement.   b. Wear an absorbent pad or soft cotton gauze in your underwear as needed to catch any drainage and help keep the area  c. Keep the area clean and dry.  Bathe / shower every day.  Keep the area clean by showering / bathing over the incision / wound.   It is okay to soak an open wound to help wash it.  Wet wipes or showers / gentle washing after bowel movements is often less traumatic than regular toilet paper. d. Dennis Bast will often notice bleeding with bowel movements.  This should slow down by the end of the first week of surgery e. Expect some drainage.  This should slow down, too, by the end of the first week of surgery.  Wear an absorbent pad or soft cotton gauze in your underwear until the drainage stops.  6. ACTIVITIES as tolerated:   a. You may resume regular (light) daily activities beginning the next day--such as daily self-care, walking, climbing stairs--gradually increasing activities as tolerated.  If you can walk 30 minutes without difficulty, it is safe to try more intense activity such as jogging, treadmill, bicycling, low-impact aerobics, swimming, etc. b. Save the most intensive and strenuous activity for last such as sit-ups, heavy lifting, contact sports, etc  Refrain from any heavy lifting or straining until you are off narcotics for pain control.   c. DO NOT PUSH THROUGH PAIN.  Let pain be your guide: If it hurts to do something, don't do it.  Pain is your body warning you to avoid that activity for another week until the pain goes down. d. You may drive when you are no longer taking prescription pain medication, you can comfortably sit for long periods of time, and you can safely maneuver your car and apply brakes. e. Dennis Bast may have sexual intercourse when it is comfortable.  7. FOLLOW UP in our office a. Please call [facility] at [phone] to set up an appointment to see your surgeon in the office for a follow-up  appointment approximately 2 weeks after your surgery. b. Make sure that you call for this appointment the day you arrive home to insure a convenient appointment time. 10. IF YOU HAVE DISABILITY OR FAMILY LEAVE FORMS, BRING THEM TO THE OFFICE FOR PROCESSING.  DO NOT GIVE THEM TO YOUR DOCTOR.        WHEN TO CALL us [phone]: 1. Poor pain control 2. Reactions / problems with new medications (rash/itching, nausea, etc)  3. Fever over 101.5 F (38.5 C) 4. Inability to urinate 5. Nausea and/or vomiting 6. Worsening swelling or bruising 7. Continued bleeding from incision. 8. Increased pain, redness, or drainage from the incision  The clinic staff is available to answer your questions during regular business hours (8:30am-5pm).  Please dont hesitate to call and ask to speak to one of our nurses for clinical concerns.   A  surgeon from [facility] is always on call at the hospitals   If you have a medical emergency, go to the nearest emergency room or call 911.    [facility] [address], [address], [place], [place]  [id number] ? MAIN: [phone] ? TOLL FREE: [phone] ? FAX [phone] [email or web address]     HEMORRHOIDS  The rectum is the last foot of your colon, and it naturally stretches to hold stool.  Hemorrhoidal piles are natural clusters of blood vessels that help the rectum and anal canal stretch to hold stool and allow bowel movements to eliminate feces.   Hemorrhoids are abnormally swollen blood vessels in the rectum.  Too much pressure in the rectum causes hemorrhoids by forcing blood to stretch and bulge the walls of the veins, sometimes even rupturing them.  Hemorrhoids can become like varicose veins you might see on a person's legs.  Most people will develop a flare of hemorrhoids in their lifetime.  When bulging hemorrhoidal veins are irritated, they can swell, burn, itch, cause pain, and bleed.  Most flares  will calm down gradually own within a few weeks.  However, once hemorrhoids are created, they are difficult to get rid of completely and tend to flare more easily than the first flare.   Fortunately, good habits and simple medical treatment usually control hemorrhoids well, and surgery is needed only in severe cases. Types of Hemorrhoids:  Internal hemorrhoids usually don't initially hurt or itch; they are deep inside the rectum and usually have no sensation. If they begin to push out (prolapse), pain and burning can occur.  However, internal hemorrhoids can bleed.  Anal bleeding should not be ignored since bleeding could come from a dangerous source like colorectal cancer, so persistent rectal bleeding should be investigated by a doctor, sometimes with a colonoscopy.  External hemorrhoids cause most of the symptoms - pain, burning, and itching. Nonirritated hemorrhoids can look like small skin tags coming out of the anus.   Thrombosed hemorrhoids can form when a hemorrhoid blood vessel bursts and causes the hemorrhoid to suddenly swell.  A purple blood clot can form in it and become an excruciatingly painful lump at the anus. Because of these unpleasant symptoms, immediate incision and drainage by a surgeon at an office visit can provide much relief of the pain.    PREVENTION Avoiding the most frequent causes listed below will prevent most cases of hemorrhoids: Constipation Hard stools Diarrhea  Constant sitting  Straining with bowel movements Sitting on the toilet for a long time  Severe coughing  episodes Pregnancy / Childbirth  Heavy Lifting  Sometimes avoiding the above triggers is difficult:  How can you avoid sitting all day if you have a seated job? Also, we try to avoid coughing and diarrhea, but sometimes its beyond your control.  Still, there are some practical hints to help: Keep the anal and genital area clean.  Moistened tissues such as flushable wet wipes are less irritating than  toilet paper.  Using irrigating showers or bottle irrigation washing gently cleans this sensitive area.   Avoid dry toilet paper when cleaning after bowel movements.  Marland Kitchen. Keep the anal and genital area dry.  Lightly pat the rectal area dry.  Avoid rubbing.  Talcum or baby powders can help GET YOUR STOOLS SOFT.   This is the most important way to prevent irritated hemorrhoids.  Hard stools are like sandpaper to the anorectal canal and will cause more problems.  The goal: ONE SOFT BOWEL MOVEMENT A DAY!  BMs from every other day to 3 times a day is a tolerable range Treat coughing, diarrhea and constipation early since irritated hemorrhoids may soon follow.  If your main job activity is seated, always stand or walk during your breaks. Make it a point to stand and walk at least 5 minutes every hour and try to shift frequently in your chair to avoid direct rectal pressure.  Always exhale as you strain or lift. Don't hold your breath.  Do not delay or try to prevent a bowel movement when the urge is present. Exercise regularly (walking or jogging 60 minutes a day) to stimulate the bowels to move. No reading or other activity while on the toilet. If bowel movements take longer than 5 minutes, you are too constipated. AVOID CONSTIPATION Drink plenty of liquids (1 1/2 to 2 quarts of water and other fluids a day unless fluid restricted for another medical condition). Liquids that contain caffeine (coffee a, tea, soft drinks) can be dehydrating and should be avoided until constipation is controlled. Consider minimizing milk, as dairy products may be constipating. Eat plenty of fiber (30g a day ideal, more if needed).  Fiber is the undigested part of plant food that passes into the colon, acting as natures broom to encourage bowel motility and movement.  Fiber can absorb and hold large amounts of water. This results in a larger, bulkier stool, which is soft and easier to pass.  Eating foods high in fiber - 12  servings - such as  Vegetables: Root (potatoes, carrots, turnips), Leafy green (lettuce, salad greens, celery, spinach), High residue (cabbage, broccoli, etc.) Fruit: Fresh, Dried (prunes, apricots, cherries), Stewed (applesauce)  Whole grain breads, pasta, whole wheat Bran cereals, muffins, etc. Consider adding supplemental bulking fiber which retains large volumes of water: Psyllium ground seeds (native plant from [place])--available as Metamucil, Konsyl, Effersyllium, Per Diem Fiber, or the less expensive generic forms.  Citrucel  (methylcellulose wood fiber) . FiberCon (Polycarbophil) Polyethylene Glycol - and artificial fiber commonly called Miralax or Glycolax.  It is helpful for people with gassy or bloated feelings with regular fiber Flax Seed - a less gassy natural fiber  Laxatives can be useful for a short period if constipation is severe Osmotics (Milk of Magnesia, Fleets Phospho-Soda, Magnesium Citrate)  Stimulants (Senokot,   Castor Oil,  Dulcolax, Ex-Lax)    Laxatives are not a good long-term solution as it can stress the bowels and cause too much mineral loss and dehydration.   Avoid taking laxatives for more than 7 days in a row.  AVOID DIARRHEA Switch to liquids and simpler foods for a few days to avoid stressing your intestines further. Avoid dairy products (especially milk & ice cream) for a short time.  The intestines often can lose the ability to digest lactose when stressed. Avoid foods that cause gassiness or bloating.  Typical foods include beans and other legumes, cabbage, broccoli, and dairy foods.  Every person has some sensitivity to other foods, so listen to your body and avoid those foods that trigger problems for you. Adding fiber (Citrucel, Metamucil, FiberCon, Flax seed, Miralax) gradually can help thicken stools by absorbing excess fluid and retrain the intestines to act more normally.  Slowly increase the dose over a few weeks.  Too much fiber too soon  can backfire and cause cramping & bloating. Probiotics (such as active yogurt, Align, etc) may help repopulate the intestines and colon with normal bacteria and calm  down a sensitive digestive tract.  Most studies show it to be of mild help, though, and such products can be costly. Medicines: Bismuth subsalicylate (ex. Kayopectate, Pepto Bismol) every 30 minutes for up to 6 doses can help control diarrhea.  Avoid if pregnant. Loperamide (Immodium) can slow down diarrhea.  Start with two tablets (4mg  total) first and then try one tablet every 6 hours.  Avoid if you are having fevers or severe pain.  If you are not better or start feeling worse, stop all medicines and call your doctor for advice Call your doctor if you are getting worse or not better.  Sometimes further testing (cultures, endoscopy, X-ray studies, bloodwork, etc) may be needed to help diagnose and treat the cause of the diarrhea.  TROUBLESHOOTING IRREGULAR BOWELS 1) Avoid extremes of bowel movements (no bad constipation/diarrhea) 2) Miralax 17gm mixed in 8oz. water or juice-daily. May use BID as needed.  3) Gas-x,Phazyme, etc. as needed for gas & bloating.  4) Soft,bland diet. No spicy,greasy,fried foods.  5) Prilosec over-the-counter as needed  6) May hold gluten/wheat products from diet to see if symptoms improve.  7)  May try probiotics (Align, Activa, etc) to help calm the bowels down 7) If symptoms become worse call back immediately.   TREATMENT OF HEMORRHOID FLARE If these preventive measures fail, you must take action right away! Hemorrhoids are one condition that can be mild in the morning and become intolerable by nightfall. Most hemorrhoidal flares take several weeks to calm down.  These suggestions can help: Warm soaks.  This helps more than any topical medication.  Use up to 8 times a day.  Usually sitz baths or sitting in a warm bathtub helps.  Sitting on moist warm towels are helpful.  Switching to ice packs/cool  compresses can be helpful  Use a Sitz Bath 4-8 times a day for relief A sitz bath is a warm water bath taken in the sitting position that covers only the hips and buttocks. It may be used for either healing or hygiene purposes. Sitz baths are also used to relieve pain, itching, or muscle spasms. The water may contain medicine. Moist heat will help you heal and relax.  HOME CARE INSTRUCTIONS  Take 3 to 4 sitz baths a day.  Fill the bathtub half full with warm water.  Sit in the water and open the drain a little.  Turn on the warm water to keep the tub half full. Keep the water running constantly.  Soak in the water for 15 to 20 minutes.  After the sitz bath, pat the affected area dry first. SEEK MEDICAL CARE IF:  You get worse instead of better. Stop the sitz baths if you get worse.  Normalize your bowels.  Extremes of diarrhea or constipation will make hemorrhoids worse.  One soft bowel movement a day is the goal.  Fiber can help get your bowels regular Wet wipes instead of toilet paper Pain control with a NSAID such as ibuprofen (Advil) or naproxen (Aleve) or acetaminophen (Tylenol) around the clock.  Narcotics are constipating and should be minimized if possible Topical creams contain steroids (bydrocortisone) or local anesthetic (xylocaine) can help make pain and itching more tolerable.   EVALUATION If hemorrhoids are still causing problems, you could benefit by an evaluation by a surgeon.  The surgeon will obtain a history and examine you.  If hemorrhoids are diagnosed, some therapies can be offered in the office, usually with an anoscope into the less sensitive area of  the rectum: -injection of hemorrhoids (sclerotherapy) can scar the blood vessels of the swollen/enlarged hemorrhoids to help shrink them down to a more normal size -rubber banding of the enlarged hemorrhoids to help shrink them down to a more normal size -drainage of the blood clot causing a thrombosed hemorrhoid,  to  relieve the severe pain   While 90% of the time such problems from hemorrhoids can be managed without preceding to surgery, sometimes the hemorrhoids require a operation to control the problem (uncontrolled bleeding, prolapse, pain, etc.).   This involves being placed under general anesthesia where the surgeon can confirm the diagnosis and remove, suture, or staple the hemorrhoid(s).  Your surgeon can help you treat the problem appropriately.    GETTING TO GOOD BOWEL HEALTH. Irregular bowel habits such as constipation and diarrhea can lead to many problems over time.  Having one soft bowel movement a day is the most important way to prevent further problems.  The anorectal canal is designed to handle stretching and feces to safely manage our ability to get rid of solid waste (feces, poop, stool) out of our body.  BUT, hard constipated stools can act like ripping concrete bricks and diarrhea can be a burning fire to this very sensitive area of our body, causing inflamed hemorrhoids, anal fissures, increasing risk is perirectal abscesses, abdominal pain/bloating, an making irritable bowel worse.      The goal: ONE SOFT BOWEL MOVEMENT A DAY!  To have soft, regular bowel movements:   Drink plenty of fluids, consider 4-6 tall glasses of water a day.    Take plenty of fiber.  Fiber is the undigested part of plant food that passes into the colon, acting s natures broom to encourage bowel motility and movement.  Fiber can absorb and hold large amounts of water. This results in a larger, bulkier stool, which is soft and easier to pass. Work gradually over several weeks up to 6 servings a day of fiber (25g a day even more if needed) in the form of: o Vegetables -- Root (potatoes, carrots, turnips), leafy green (lettuce, salad greens, celery, spinach), or cooked high residue (cabbage, broccoli, etc) o Fruit -- Fresh (unpeeled skin & pulp), Dried (prunes, apricots, cherries, etc ),  or stewed ( applesauce)   o Whole grain breads, pasta, etc (whole wheat)  o Bran cereals   Bulking Agents -- This type of water-retaining fiber generally is easily obtained each day by one of the following:  o Psyllium bran -- The psyllium plant is remarkable because its ground seeds can retain so much water. This product is available as Metamucil, Konsyl, Effersyllium, Per Diem Fiber, or the less expensive generic preparation in drug and health food stores. Although labeled a laxative, it really is not a laxative.  o Methylcellulose -- This is another fiber derived from wood which also retains water. It is available as Citrucel. o Polyethylene Glycol - and artificial fiber commonly called Miralax or Glycolax.  It is helpful for people with gassy or bloated feelings with regular fiber o Flax Seed - a less gassy fiber than psyllium  No reading or other relaxing activity while on the toilet. If bowel movements take longer than 5 minutes, you are too constipated  AVOID CONSTIPATION.  High fiber and water intake usually takes care of this.  Sometimes a laxative is needed to stimulate more frequent bowel movements, but   Laxatives are not a good long-term solution as it can wear the colon out.  They can  help jump-start bowels if constipated, but should be relied on constantly without discussing with your doctor o Osmotics (Milk of Magnesia, Fleets phosphosoda, Magnesium citrate, MiraLax, GoLytely) are safer than  o Stimulants (Senokot, Castor Oil, Dulcolax, Ex Lax)    o Avoid taking laxatives for more than 7 days in a row.   IF SEVERELY CONSTIPATED, try a Bowel Retraining Program: o Do not use laxatives.  o Eat a diet high in roughage, such as bran cereals and leafy vegetables.  o Drink six (6) ounces of prune or apricot juice each morning.  o Eat two (2) large servings of stewed fruit each day.  o Take one (1) heaping tablespoon of a psyllium-based bulking agent twice a day. Use sugar-free sweetener when possible to  avoid excessive calories.  o Eat a normal breakfast.  o Set aside 15 minutes after breakfast to sit on the toilet, but do not strain to have a bowel movement.  o If you do not have a bowel movement by the third day, use an enema and repeat the above steps.   Controlling diarrhea o Switch to liquids and simpler foods for a few days to avoid stressing your intestines further. o Avoid dairy products (especially milk & ice cream) for a short time.  The intestines often can lose the ability to digest lactose when stressed. o Avoid foods that cause gassiness or bloating.  Typical foods include beans and other legumes, cabbage, broccoli, and dairy foods.  Every person has some sensitivity to other foods, so listen to our body and avoid those foods that trigger problems for you. o Adding fiber (Citrucel, Metamucil, psyllium, Miralax) gradually can help thicken stools by absorbing excess fluid and retrain the intestines to act more normally.  Slowly increase the dose over a few weeks.  Too much fiber too soon can backfire and cause cramping & bloating. o Probiotics (such as active yogurt, Align, etc) may help repopulate the intestines and colon with normal bacteria and calm down a sensitive digestive tract.  Most studies show it to be of mild help, though, and such products can be costly. o Medicines: - Bismuth subsalicylate (ex. Kayopectate, Pepto Bismol) every 30 minutes for up to 6 doses can help control diarrhea.  Avoid if pregnant. - Loperamide (Immodium) can slow down diarrhea.  Start with two tablets (  total) first and then try one tablet every 6 hours.  Avoid if you are having fevers or severe pain.  If you are not better or start feeling worse, stop all medicines and call your doctor for advice o Call your doctor if you are getting worse or not better.  Sometimes further testing (cultures, endoscopy, X-ray studies, bloodwork, etc) may be needed to help diagnose and treat the cause of the  diarrhea.  TROUBLESHOOTING IRREGULAR BOWELS 1) Avoid extremes of bowel movements (no bad constipation/diarrhea) 2) Miralax 17gm mixed in 8oz. water or juice-daily. May use BID as needed.  3) Gas-x,Phazyme, etc. as needed for gas & bloating.  4) Soft,bland diet. No spicy,greasy,fried foods.  5) Prilosec over-the-counter as needed  6) May hold gluten/wheat products from diet to see if symptoms improve.  7)  May try probiotics (Align, Activa, etc) to help calm the bowels down 7) If symptoms become worse call back immediately.  Managing Pain  Pain after surgery or related to activity is often due to strain/injury to muscle, tendon, nerves and/or incisions.  This pain is usually short-term and will improve in a few months.   Many  people find it helpful to do the following things TOGETHER to help speed the process of healing and to get back to regular activity more quickly:  1. Avoid heavy physical activity at first a. No lifting greater than 20 pounds at first, then increase to lifting as tolerated over the next few weeks b. Do not push through the pain.  Listen to your body and avoid positions and maneuvers than reproduce the pain.  Wait a few days before trying something more intense c. Walking is okay as tolerated, but go slowly and stop when getting sore.  If you can walk 30 minutes without stopping or pain, you can try more intense activity (running, jogging, aerobics, cycling, swimming, treadmill, sex, sports, weightlifting, etc ) d. Remember: If it hurts to do it, then dont do it!  2. Take Anti-inflammatory medication a. Choose ONE of the following over-the-counter medications: i.            Acetaminophen 500mg  tabs (Tylenol) 1-2 pills with every meal and just before bedtime (avoid if you have liver problems) ii.            Naproxen 220mg  tabs (ex. Aleve) 1-2 pills twice a day (avoid if you have kidney, stomach, IBD, or bleeding problems) iii. Ibuprofen 200mg  tabs (ex. Advil, Motrin)  3-4 pills with every meal and just before bedtime (avoid if you have kidney, stomach, IBD, or bleeding problems) b. Take with food/snack around the clock for 1-2 weeks i. This helps the muscle and nerve tissues become less irritable and calm down faster  3. Use a Heating pad or Ice/Cold Pack a. 4-6 times a day b. May use warm bath/hottub  or showers  4. Try Gentle Massage and/or Stretching  a. at the area of pain many times a day b. stop if you feel pain - do not overdo it  Try these steps together to help you body heal faster and avoid making things get worse.  Doing just one of these things may not be enough.    If you are not getting better after two weeks or are noticing you are getting worse, contact our office for further advice; we may need to re-evaluate you & see what other things we can do to help.   General Anesthesia, Adult, Care After Refer to this sheet in the next few weeks. These instructions provide you with information on caring for yourself after your procedure. Your health care provider may also give you more specific instructions. Your treatment has been planned according to current medical practices, but problems sometimes occur. Call your health care provider if you have any problems or questions after your procedure. WHAT TO EXPECT AFTER THE PROCEDURE After the procedure, it is typical to experience:  Sleepiness.  Nausea and vomiting. HOME CARE INSTRUCTIONS  For the first 24 hours after general anesthesia:  Have a responsible person with you.  Do not drive a car. If you are alone, do not take public transportation.  Do not drink alcohol.  Do not take medicine that has not been prescribed by your health care provider.  Do not sign important papers or make important decisions.  You may resume a normal diet and activities as directed by your health care provider.  Change bandages (dressings) as directed.  If you have questions or problems that seem  related to general anesthesia, call the hospital and ask for the anesthetist or anesthesiologist on call. SEEK MEDICAL CARE IF:  You have nausea and vomiting that continue  the day after anesthesia.  You develop a rash. SEEK IMMEDIATE MEDICAL CARE IF:   You have difficulty breathing.  You have chest pain.  You have any allergic problems.   This information is not intended to replace advice given to you by your health care provider. Make sure you discuss any questions you have with your health care provider.   Document Released: 10/18/2000 Document Revised: 08/02/2014 Document Reviewed: 11/10/2011 Elsevier Interactive Patient Education [employer]

## 2015-12-03 NOTE — Anesthesia Postprocedure Evaluation (Signed)
Anesthesia Post Note  Patient: Tommy Page  Procedure(s) Performed: Procedure(s) (LRB): TRANSANAL HEMORRHOIDAL DEARTERIALIZATION LIGATION/PEXY  EUA HEMORRHOIDECTOMY  (N/A)  Patient location during evaluation: PACU Anesthesia Type: General Level of consciousness: awake and alert Pain management: pain level controlled Vital Signs Assessment: post-procedure vital signs reviewed and stable Respiratory status: spontaneous breathing, nonlabored ventilation, respiratory function stable and patient connected to nasal cannula oxygen Cardiovascular status: blood pressure returned to baseline and stable Postop Assessment: no signs of nausea or vomiting Anesthetic complications: no    Last Vitals:  Filed Vitals:   12/03/15 0957 12/03/15 1413  BP: 116/81 140/93  Pulse: 64 84  Temp: 36.5 C 36.3 C  Resp: 18 16    Last Pain:  Filed Vitals:   12/03/15 1422  PainSc: 0-No pain                 Reino KentJudd, Dominic Mahaney J

## 2015-12-03 NOTE — Interval H&P Note (Signed)
History and Physical Interval Note:  12/03/2015 12:24 PM  Tommy Page  has presented today for surgery, with the diagnosis of Grade 3 and 4 prolapsed hemorrhoids with drainage, pain and bleeding   The various methods of treatment have been discussed with the patient and family. After consideration of risks, benefits and other options for treatment, the patient has consented to  Procedure(s): TRANSANAL HEMORRHOIDAL DEARTERIALIZATION LIGATION/PEXY  EUA POSSIBLE HEMORRHOIDECTOMY  (N/A) as a surgical intervention .  The patient's history has been reviewed, patient examined, no change in status, stable for surgery.  I have reviewed the patient's chart and labs.  Questions were answered to the patient's satisfaction.     [name] C.

## 2015-12-03 NOTE — H&P (View-Only) (Signed)
Tommy Page 11/10/2015 1:29 PM Location: [facility] Patient #: [id number] DOB: 10/27/1960 Single / Language: English / Race: White Male  History of Present Illness (Aryn Kops C. Wing Gfeller MD; 11/10/2015 2:00 PM) The patient is a 54 year old male who presents with hemorrhoids. Note for "Hemorrhoids": Patient sent by Dr. William outlaw with [facility] for consultation concerning chronically prolapsed/prolapsing hemorrhoids.  Active male. History of irregular bowels with occasional constipation and diarrhea. Moisture and drainage on his underwear. Occasional blood as well. Concern by exam and colonoscopy for hemorrhoids no other major abnormalities. Surgical consultation requested to see if more definitive surgical treatment of hemorrhoids of benefit. He notes he's had hemorrhoid issues for many years. Work often makes him traveling a lot. Heart to stay compliant on a low fat high fiber diet. The greasy or richer foods Bother Him. Worsening Heartburn and Reflux. Worsening Loose Stools. He Is Transitioned His Fiber Supplement since He Saw Gastroenterology and His Bowels Are Moving Better but He Still Struggles. Especially with Leakage. Ostomy Pain Recently That Ruled Out Any Major Other Pathologies. Patient's Never Had Any Anorectal Intervention Such As Banding Surgery Treatment. Pads Often Help. Agents like Preparation H or a Prescription Anusol Has Not Really Helped Much.  No personal nor family history of GI/colon cancer, inflammatory bowel disease, irritable bowel syndrome, allergy such as Celiac Sprue, dietary/dairy problems, colitis, ulcers nor gastritis. No recent sick contacts/gastroenteritis. No travel outside the country. No changes in diet. No dysphagia to solids or liquids. No significant heartburn or reflux. No hematochezia, hematemesis, coffee ground emesis. No evidence of prior gastric/peptic ulceration.   Other Problems (Ashley Beck, CMA;  11/10/2015 1:29 PM) Anxiety Disorder Back Pain Gastroesophageal Reflux Disease Hypercholesterolemia  Diagnostic Studies History (Ashley Beck, CMA; 11/10/2015 1:29 PM) Colonoscopy within last year  Allergies (Ashley Beck, CMA; 11/10/2015 1:30 PM) No Known Drug Allergies 11/10/2015  Medication History (Ashley Beck, CMA; 11/10/2015 1:30 PM) Simvastatin (5MG Tablet, Oral) Active. Omeprazole (10MG Capsule DR, Oral) Active. Medications Reconciled  Social History (Ashley Beck, CMA; 11/10/2015 1:29 PM) Alcohol use Moderate alcohol use. Caffeine use Carbonated beverages, Coffee. Illicit drug use Remotely quit drug use. Tobacco use Former smoker.  Family History (Ashley Beck, CMA; 11/10/2015 1:29 PM) Alcohol Abuse Father. Arthritis Mother. Colon Cancer Father. Colon Polyps Brother, Mother. Heart Disease Father. Heart disease in male family member before age 55 Hypertension Mother. Melanoma Brother. Prostate Cancer Father.     Review of Systems (Ashley Beck CMA; 11/10/2015 1:29 PM) General Not Present- Appetite Loss, Chills, Fatigue, Fever, Night Sweats, Weight Gain and Weight Loss. Skin Present- Change in Wart/Mole. Not Present- Dryness, Hives, Jaundice, New Lesions, Non-Healing Wounds, Rash and Ulcer. HEENT Not Present- Earache, Hearing Loss, Hoarseness, Nose Bleed, Oral Ulcers, Ringing in the Ears, Seasonal Allergies, Sinus Pain, Sore Throat, Visual Disturbances, Wears glasses/contact lenses and Yellow Eyes. Respiratory Present- Snoring. Not Present- Bloody sputum, Chronic Cough, Difficulty Breathing and Wheezing. Breast Not Present- Breast Mass, Breast Pain, Nipple Discharge and Skin Changes. Cardiovascular Not Present- Chest Pain, Difficulty Breathing Lying Down, Leg Cramps, Palpitations, Rapid Heart Rate, Shortness of Breath and Swelling of Extremities. Gastrointestinal Present- Bloody Stool and Hemorrhoids. Not Present- Abdominal Pain, Bloating, Change in Bowel  Habits, Chronic diarrhea, Constipation, Difficulty Swallowing, Excessive gas, Gets full quickly at meals, Indigestion, Nausea, Rectal Pain and Vomiting. Male Genitourinary Not Present- Blood in Urine, Change in Urinary Stream, Frequency, Impotence, Nocturia, Painful Urination, Urgency and Urine Leakage. Musculoskeletal Not Present- Back Pain, Joint Pain, Joint Stiffness, Muscle Pain, Muscle   Weakness and Swelling of Extremities. Neurological Not Present- Decreased Memory, Fainting, Headaches, Numbness, Seizures, Tingling, Tremor, Trouble walking and Weakness. Psychiatric Present- Anxiety. Not Present- Bipolar, Change in Sleep Pattern, Depression, Fearful and Frequent crying. Endocrine Not Present- Cold Intolerance, Excessive Hunger, Hair Changes, Heat Intolerance, Hot flashes and New Diabetes. Hematology Not Present- Easy Bruising, Excessive bleeding, Gland problems, HIV and Persistent Infections.  Vitals (Ashley Beck CMA; 11/10/2015 1:31 PM) 11/10/2015 1:30 PM Weight: 174.8 lb Height: 70in Body Surface Area: 1.97 m Body Mass Index: 25.08 kg/m  Temp.: 97.2F  Pulse: 83 (Regular)  BP: 130/72 (Sitting, Left Arm, Standard)      Physical Exam (Kaniah Rizzolo C. Faith Patricelli MD; 11/10/2015 2:01 PM)  General Mental Status-Alert. General Appearance-Not in acute distress, Not Sickly. Orientation-Oriented X3. Hydration-Well hydrated. Voice-Normal.  Integumentary Global Assessment Upon inspection and palpation of skin surfaces of the - Axillae: non-tender, no inflammation or ulceration, no drainage. and Distribution of scalp and body hair is normal. General Characteristics Temperature - normal warmth is noted.  Head and Neck Head-normocephalic, atraumatic with no lesions or palpable masses. Face Global Assessment - atraumatic, no absence of expression. Neck Global Assessment - no abnormal movements, no bruit auscultated on the right, no bruit auscultated on the left, no decreased  range of motion, non-tender. Trachea-midline. Thyroid Gland Characteristics - non-tender.  Eye Eyeball - Left-Extraocular movements intact, No Nystagmus. Eyeball - Right-Extraocular movements intact, No Nystagmus. Cornea - Left-No Hazy. Cornea - Right-No Hazy. Sclera/Conjunctiva - Left-No scleral icterus, No Discharge. Sclera/Conjunctiva - Right-No scleral icterus, No Discharge. Pupil - Left-Direct reaction to light normal. Pupil - Right-Direct reaction to light normal.  ENMT Ears Pinna - Left - no drainage observed, no generalized tenderness observed. Right - no drainage observed, no generalized tenderness observed. Nose and Sinuses External Inspection of the Nose - no destructive lesion observed. Inspection of the nares - Left - quiet respiration. Right - quiet respiration. Mouth and Throat Lips - Upper Lip - no fissures observed, no pallor noted. Lower Lip - no fissures observed, no pallor noted. Nasopharynx - no discharge present. Oral Cavity/Oropharynx - Tongue - no dryness observed. Oral Mucosa - no cyanosis observed. Hypopharynx - no evidence of airway distress observed. Note: Hard of hearing  Chest and Lung Exam Inspection Movements - Normal and Symmetrical. Accessory muscles - No use of accessory muscles in breathing. Palpation Palpation of the chest reveals - Non-tender. Auscultation Breath sounds - Normal and Clear.  Cardiovascular Auscultation Rhythm - Regular. Murmurs & Other Heart Sounds - Auscultation of the heart reveals - No Murmurs and No Systolic Clicks.  Abdomen Inspection Inspection of the abdomen reveals - No Visible peristalsis and No Abnormal pulsations. Umbilicus - No Bleeding, No Urine drainage. Palpation/Percussion Palpation and Percussion of the abdomen reveal - Soft, Non Tender, No Rebound tenderness, No Rigidity (guarding) and No Cutaneous hyperesthesia. Note: Abdomen soft and flat. No diastases. No umbilical hernia.  Nontender. Nondistended.  Male Genitourinary Sexual Maturity Tanner 5 - Adult hair pattern and Adult penile size and shape. Note: Normal external genitalia. Epididymi, testes, and spermatic cords normal without any masses. No inguinal hernias.  Rectal Note: External hemorrhoids: Left lateral greater than right posterior partially prolapsed piles.  Hemorrhoidal piles: Enlarged right anterior grade 3 hemorrhoid easily prolapses. Circumferential irritation of the anal canal. No rectal masses. No condyloma.    Exam done with assistance of male Medical Assistant in the room. Perianal skin clean with good hygiene. No pruritis ani. No pilonidal disease. No fissure. No abscess/fistula. Normal sphincter   tone. Tolerates digital and anoscopic rectal exam. No rectal masses. Prostate smooth and not enlarged. No discrete masses.  Peripheral Vascular Upper Extremity Inspection - Left - No Cyanotic nailbeds, Not Ischemic. Right - No Cyanotic nailbeds, Not Ischemic.  Neurologic Neurologic evaluation reveals -normal attention span and ability to concentrate, able to name objects and repeat phrases. Appropriate fund of knowledge , normal sensation and normal coordination. Mental Status Affect - not angry, not paranoid. Cranial Nerves-Normal Bilaterally. Gait-Normal.  Neuropsychiatric Mental status exam performed with findings of-able to articulate well with normal speech/language, rate, volume and coherence, thought content normal with ability to perform basic computations and apply abstract reasoning and no evidence of hallucinations, delusions, obsessions or homicidal/suicidal ideation.  Musculoskeletal Global Assessment Spine, Ribs and Pelvis - no instability, subluxation or laxity. Right Upper Extremity - no instability, subluxation or laxity.  Lymphatic Head & Neck  General Head & Neck Lymphatics: Bilateral - Description - No Localized lymphadenopathy. Axillary  General Axillary  Region: Bilateral - Description - No Localized lymphadenopathy. Femoral & Inguinal  Generalized Femoral & Inguinal Lymphatics: Left - Description - No Localized lymphadenopathy. Right - Description - No Localized lymphadenopathy.   Results (Arlyn Bumpus C. Lisha Vitale MD; 11/10/2015 1:59 PM) Procedures  Name Value Date Hemorrhoids Procedure Anal exam: External Hemorrhoid Bleeding Internal exam: Internal Hemorroids ( non-bleeding) prolapse Other: External hemorrhoids: Left lateral greater than right posterior partially prolapsed piles. Hemorrhoidal piles: Enlarged right anterior grade 3 hemorrhoid easily prolapses. Circumferential irritation of the anal canal. No rectal masses. No condyloma. Exam done with assistance of male Medical Assistant in the room. Perianal skin clean with good hygiene. No pruritis ani. No pilonidal disease. No fissure. No abscess/fistula. Normal sphincter tone. Tolerates digital and anoscopic rectal exam. No rectal masses. Prostate smooth and not enlarged. No discrete masses.  Performed: 11/10/2015 1:49 PM    Assessment & Plan (Jonni Oelkers C. 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  I think this is too for long to treat with just injections or banding. Requires hemorrhoidal ligation and possible hemorrhoidectomy. Outpatient surgery. He is interested in proceeding.  I strongly recommend he continue on his fiber bowel regimen. He notes when he can stay compliant on that, it helps a lot. He is trying to get away from heavier greasier foods. [place] I can get his anatomy better with surgery, less likely to have any hemorrhoid problems in the future. He is motivated.  Current Plans ANOSCOPY, DIAGNOSTIC (46600) Pt Education  - [facility] Hemorrhoids (Teale Goodgame): discussed with patient and provided information. Pt Education - Pamphlet Given - The Hemorrhoid Book: discussed with patient and provided information. You are being scheduled for surgery - Our schedulers will call you.  You should hear from our office's scheduling department within 5 working days about the location, date, and time of surgery. We try to make accommodations for patient's preferences in scheduling surgery, but sometimes the OR schedule or the surgeon's schedule prevents us from making those accommodations.  If you have not heard from our office ([phone]) in 5 working days, call the office and ask for your surgeon's nurse.  If you have other questions about your diagnosis, plan, or surgery, call the office and ask for your surgeon's nurse.  The anatomy & physiology of the anorectal region was discussed. The pathophysiology of hemorrhoids and differential diagnosis was discussed.   Natural history risks without surgery was discussed. I stressed the importance of a bowel regimen to have daily soft bowel movements to minimize progression of disease. Interventions such as sclerotherapy & banding were discussed.  The patient's symptoms are not adequately controlled by medicines and other non-operative treatments. I feel the risks & problems of no surgery outweigh the operative risks; therefore, I recommended surgery to treat the hemorrhoids by ligation, pexy, and possible resection.  Risks such as bleeding, infection, urinary difficulties, need for further treatment, heart attack, death, and other risks were discussed. I noted a good likelihood this will help address the problem. Goals of post-operative recovery were discussed as well. Possibility that this will not correct all symptoms was explained. Post-operative pain, bleeding, constipation, and other problems after surgery were discussed. We will work to minimize complications. Educational handouts further  explaining the pathology, treatment options, and bowel regimen were given as well. Questions were answered. The patient expresses understanding & wishes to proceed with surgery.  Pt Education - [facility] Rectal Prep for Anorectal outpatient/office surgery: discussed with patient and provided information. Pt Education - [facility] Rectal Surgery HCI (Kelvon Giannini): discussed with patient and provided information.  Fredi Hurtado C. Eri Mcevers, M.D., F.A.C.S. Gastrointestinal and Minimally Invasive Surgery [facility] [address], [address] [place], [place] [address] [phone] Main / Paging   

## 2015-12-03 NOTE — Anesthesia Preprocedure Evaluation (Signed)
Anesthesia Evaluation  Patient identified by MRN, date of birth, ID band Patient awake    Reviewed: Allergy & Precautions, H&P , NPO status , Patient's Chart, lab work & pertinent test results  History of Anesthesia Complications Negative for: history of anesthetic complications  Airway Mallampati: II  TM Distance: >3 FB Neck ROM: full    Dental no notable dental hx.    Pulmonary former smoker,    Pulmonary exam normal breath sounds clear to auscultation       Cardiovascular negative cardio ROS Normal cardiovascular exam Rhythm:regular Rate:Normal     Neuro/Psych Anxiety Depression negative neurological ROS     GI/Hepatic Neg liver ROS, GERD  Medicated,  Endo/Other  negative endocrine ROS  Renal/GU negative Renal ROS     Musculoskeletal   Abdominal   Peds  Hematology negative hematology ROS (+)   Anesthesia Other Findings   Reproductive/Obstetrics negative OB ROS                             Anesthesia Physical Anesthesia Plan  ASA: II  Anesthesia Plan: General   Post-op Pain Management:    Induction: Intravenous  Airway Management Planned: Oral ETT  Additional Equipment:   Intra-op Plan:   Post-operative Plan: Extubation in OR  Informed Consent: I have reviewed the patients History and Physical, chart, labs and discussed the procedure including the risks, benefits and alternatives for the proposed anesthesia with the patient or authorized representative who has indicated his/her understanding and acceptance.   Dental Advisory Given  Plan Discussed with: Anesthesiologist, CRNA and Surgeon  Anesthesia Plan Comments:         Anesthesia Quick Evaluation

## 2015-12-04 ENCOUNTER — Encounter (HOSPITAL_COMMUNITY): Payer: Self-pay | Admitting: Surgery

## 2015-12-05 ENCOUNTER — Encounter (HOSPITAL_COMMUNITY): Payer: Self-pay

## 2015-12-05 ENCOUNTER — Emergency Department (HOSPITAL_COMMUNITY)
Admission: EM | Admit: 2015-12-05 | Discharge: 2015-12-05 | Disposition: A | Discharge status: Home / Self Care | Payer: BLUE CROSS/BLUE SHIELD | Attending: Emergency Medicine | Admitting: Emergency Medicine

## 2015-12-05 DIAGNOSIS — K219 Gastro-esophageal reflux disease without esophagitis: Secondary | ICD-10-CM | POA: Diagnosis not present

## 2015-12-05 DIAGNOSIS — R339 Retention of urine, unspecified: Secondary | ICD-10-CM | POA: Insufficient documentation

## 2015-12-05 DIAGNOSIS — R109 Unspecified abdominal pain: Secondary | ICD-10-CM | POA: Insufficient documentation

## 2015-12-05 DIAGNOSIS — Z79899 Other long term (current) drug therapy: Secondary | ICD-10-CM | POA: Diagnosis not present

## 2015-12-05 DIAGNOSIS — R338 Other retention of urine: Secondary | ICD-10-CM

## 2015-12-05 DIAGNOSIS — Z8659 Personal history of other mental and behavioral disorders: Secondary | ICD-10-CM | POA: Insufficient documentation

## 2015-12-05 DIAGNOSIS — Z87891 Personal history of nicotine dependence: Secondary | ICD-10-CM | POA: Diagnosis not present

## 2015-12-05 DIAGNOSIS — H9193 Unspecified hearing loss, bilateral: Secondary | ICD-10-CM | POA: Diagnosis not present

## 2015-12-05 DIAGNOSIS — R39198 Other difficulties with micturition: Secondary | ICD-10-CM | POA: Diagnosis not present

## 2015-12-05 LAB — URINALYSIS, ROUTINE W REFLEX MICROSCOPIC
BILIRUBIN URINE: NEGATIVE
Glucose, UA: NEGATIVE mg/dL
Hgb urine dipstick: NEGATIVE
KETONES UR: NEGATIVE mg/dL
LEUKOCYTES UA: NEGATIVE
NITRITE: NEGATIVE
PH: 7.5 (ref 5.0–8.0)
Protein, ur: NEGATIVE mg/dL
SPECIFIC GRAVITY, URINE: 1.009 (ref 1.005–1.030)

## 2015-12-05 MED ORDER — LIDOCAINE HCL 2 % EX GEL
1.0000 "application " | Freq: Once | CUTANEOUS | Status: AC
  Administered 2015-12-05: 1 via URETHRAL
  Filled 2015-12-05: qty 11

## 2015-12-05 NOTE — ED Notes (Signed)
Pt difficult to scan due to he doesn't want to lay down because it hurts because of his hemmorroid surgery

## 2015-12-05 NOTE — ED Notes (Signed)
Pt had hemorroid surgery yesterday and hasn't urinated since 5 am yesterday

## 2015-12-05 NOTE — Discharge Instructions (Signed)
Acute Urinary Retention, Male °Acute urinary retention is the temporary inability to urinate. °This is a common problem in older men. As men age their prostates become larger and block the flow of urine from the bladder. This is usually a problem that has come on gradually.  °HOME CARE INSTRUCTIONS °If you are sent home with a Foley catheter and a drainage system, you will need to discuss the best course of action with your health care provider. While the catheter is in, maintain a good intake of fluids. Keep the drainage bag emptied and lower than your catheter. This is so that contaminated urine will not flow back into your bladder, which could lead to a urinary tract infection. °There are two main types of drainage bags. One is a large bag that usually is used at night. It has a good capacity that will allow you to sleep through the night without having to empty it. The second type is called a leg bag. It has a smaller capacity, so it needs to be emptied more frequently. However, the main advantage is that it can be attached by a leg strap and can go underneath your clothing, allowing you the freedom to move about or leave your home. °Only take over-the-counter or prescription medicines for pain, discomfort, or fever as directed by your health care provider.  °SEEK MEDICAL CARE IF: °· You develop a low-grade fever. °· You experience spasms or leakage of urine with the spasms. °SEEK IMMEDIATE MEDICAL CARE IF:  °· You develop chills or fever. °· Your catheter stops draining urine. °· Your catheter falls out. °· You start to develop increased bleeding that does not respond to rest and increased fluid intake. °MAKE SURE YOU: °· Understand these instructions. °· Will watch your condition. °· Will get help right away if you are not doing well or get worse. °  °This information is not intended to replace advice given to you by your health care provider. Make sure you discuss any questions you have with your health care  provider. °  °Document Released: 10/18/2000 Document Revised: 11/26/2014 Document Reviewed: 12/21/2012 °Elsevier Interactive Patient Education ©2016 [employer] ° °

## 2015-12-05 NOTE — ED Provider Notes (Signed)
CSN: [id number]     Arrival date & time 12/05/15  0029 History  By signing my name below, I, Tommy Page, attest that this documentation has been prepared under the direction and in the presence of Dione Boozeavid Leronda Lewers, MD. Electronically Signed: Bethel BornBritney Page, ED Scribe. 12/05/2015. 1:54 AM   Chief Complaint  Patient presents with  . Urinary Retention    The history is provided by the patient. No language interpreter was used.   Tommy Page is a 55 y.o. male who presents to the Emergency Department complaining of urinary retention with onset 20 hours ago. Pt states that he had hemorrhoid surgery 2 days ago and was attempting to stay hydrated while taking oxycodone when he drank 3 gallons of water. Since then he has been unable to urinate despite having the urge.  Associated symptoms include lower abdominal pressure.   Past Medical History  Diagnosis Date  . Anxiety   . Depression   . GERD (gastroesophageal reflux disease)   . HOH (hard of hearing)     BOTH EARS  . Hemorrhoids    Past Surgical History  Procedure Laterality Date  . Basal cell carcinoma removed  2013    LEFT TEMPLE  . Nasal septum repair  2005  . Lasix eye surgery  2005  . Transanal hemorrhoidal dearterialization N/A 12/03/2015    Procedure: TRANSANAL HEMORRHOIDAL DEARTERIALIZATION LIGATION/PEXY  EUA HEMORRHOIDECTOMY ;  Surgeon: Karie SodaSteven Gross, MD;  Location: [facility];  Service: General;  Laterality: N/A;   History reviewed. No pertinent family history. Social History  Substance Use Topics  . Smoking status: Former Smoker    Quit date: 06/26/2015  . Smokeless tobacco: Never Used  . Alcohol Use: Yes     Comment: OCCASIONAL    Review of Systems  Gastrointestinal: Positive for abdominal pain.  Genitourinary: Positive for difficulty urinating.  All other systems reviewed and are negative.  Allergies  Zoloft  Home Medications   Prior to Admission medications   Medication Sig Start Date End Date Taking?  Authorizing Provider  hydrocortisone (ANUSOL-HC) 2.5 % rectal cream Place 1 application rectally 2 (two) times daily. Apply around anus for irritated & painful hemorrhoids 12/03/15  Yes Karie SodaSteven Gross, MD  naproxen sodium (ALEVE) 220 MG tablet Take 440 mg by mouth every 8 (eight) hours as needed (For pain.).   Yes Historical Provider, MD  nicotine (NICODERM CQ - DOSED IN MG/24 HOURS) 21 mg/24hr patch Place 21 mg onto the skin daily.   Yes Historical Provider, MD  nicotine polacrilex (NICORETTE) 2 MG gum Take 2 mg by mouth as needed for smoking cessation.   Yes Historical Provider, MD  omeprazole (PRILOSEC OTC) 20 MG tablet Take 20 mg by mouth daily.   Yes Historical Provider, MD  oxyCODONE (OXY IR/ROXICODONE) 5 MG immediate release tablet Take 1-2 tablets (5-10 mg total) by mouth every 4 (four) hours as needed for moderate pain, severe pain or breakthrough pain. 12/03/15  Yes Karie SodaSteven Gross, MD  simvastatin (ZOCOR) 20 MG tablet Take 20 mg by mouth at bedtime.    Yes Historical Provider, MD  tamsulosin (FLOMAX) 0.4 MG CAPS capsule Take 0.4 mg by mouth daily.   Yes Historical Provider, MD  Wheat Dextrin (BENEFIBER) POWD Take 10 mLs by mouth 2 (two) times daily.   Yes Historical Provider, MD   BP 169/116 mmHg  Pulse 79  Temp(Src) 97.7 F (36.5 C) (Oral)  Resp 20  SpO2 97% Physical Exam  Constitutional: He is oriented to person, place, and time.  He appears well-developed and well-nourished.  HENT:  Head: Normocephalic and atraumatic.  Eyes: EOM are normal. Pupils are equal, round, and reactive to light.  Neck: Normal range of motion. Neck supple. No JVD present.  Cardiovascular: Normal rate, regular rhythm, normal heart sounds and intact distal pulses.   No murmur heard. Pulmonary/Chest: Effort normal and breath sounds normal. He has no wheezes. He has no rales. He exhibits no tenderness.  Abdominal: Soft. Bowel sounds are normal. He exhibits no distension and no mass. There is no tenderness.   Bladder distended almost to the umbilicus   Musculoskeletal: Normal range of motion. He exhibits no edema.  Lymphadenopathy:    He has no cervical adenopathy.  Neurological: He is alert and oriented to person, place, and time. No cranial nerve deficit. He exhibits normal muscle tone. Coordination normal.  Skin: Skin is warm and dry. No rash noted.  Psychiatric: He has a normal mood and affect. His behavior is normal. Judgment and thought content normal.  Nursing note and vitals reviewed.   ED Course  Procedures (including critical care time) DIAGNOSTIC STUDIES: Oxygen Saturation is 97% on RA,  normal by my interpretation.    COORDINATION OF CARE: 1:29 AM Discussed treatment plan which includes UA and foley catheter placement with pt at bedside and pt agreed to plan.  Labs Review Results for orders placed or performed during the hospital encounter of 12/05/15  Urinalysis, Routine w reflex microscopic (not at [facility])  Result Value Ref Range   Color, Urine YELLOW YELLOW   APPearance CLEAR CLEAR   Specific Gravity, Urine 1.009 1.005 - 1.030   pH 7.5 5.0 - 8.0   Glucose, UA NEGATIVE NEGATIVE mg/dL   Hgb urine dipstick NEGATIVE NEGATIVE   Bilirubin Urine NEGATIVE NEGATIVE   Ketones, ur NEGATIVE NEGATIVE mg/dL   Protein, ur NEGATIVE NEGATIVE mg/dL   Nitrite NEGATIVE NEGATIVE   Leukocytes, UA NEGATIVE NEGATIVE   I have personally reviewed and evaluated these lab results as part of my medical decision-making.   MDM   Final diagnoses:  Acute urinary retention    Acute urinary retention. Old records are reviewed confirming recent hemorrhoidectomy surgery. Patient mitts to drinking large 5 AM of fluids and I believe his bladder just got over distended. Foley catheter is placed, and has drained 2300 mL. Urinalysis shows no evidence of infection. Catheter is removed and he is discharged with instructions to try to urinate frequently to keep the bladder from getting overly distended.  Return if urinary retention recurs.    Dione Booze, MD 12/05/15 [phone]

## 2015-12-06 LAB — URINE CULTURE: Culture: NO GROWTH

## 2015-12-10 ENCOUNTER — Encounter (HOSPITAL_COMMUNITY): Payer: Self-pay | Admitting: Emergency Medicine

## 2015-12-10 ENCOUNTER — Emergency Department (HOSPITAL_COMMUNITY)
Admission: EM | Admit: 2015-12-10 | Discharge: 2015-12-10 | Disposition: A | Discharge status: Home / Self Care | Payer: BLUE CROSS/BLUE SHIELD | Attending: Emergency Medicine | Admitting: Emergency Medicine

## 2015-12-10 ENCOUNTER — Emergency Department (HOSPITAL_COMMUNITY): Payer: BLUE CROSS/BLUE SHIELD

## 2015-12-10 DIAGNOSIS — Y999 Unspecified external cause status: Secondary | ICD-10-CM | POA: Insufficient documentation

## 2015-12-10 DIAGNOSIS — F329 Major depressive disorder, single episode, unspecified: Secondary | ICD-10-CM | POA: Insufficient documentation

## 2015-12-10 DIAGNOSIS — S300XXA Contusion of lower back and pelvis, initial encounter: Secondary | ICD-10-CM | POA: Diagnosis not present

## 2015-12-10 DIAGNOSIS — R55 Syncope and collapse: Secondary | ICD-10-CM | POA: Diagnosis present

## 2015-12-10 DIAGNOSIS — Y929 Unspecified place or not applicable: Secondary | ICD-10-CM | POA: Diagnosis not present

## 2015-12-10 DIAGNOSIS — Z791 Long term (current) use of non-steroidal anti-inflammatories (NSAID): Secondary | ICD-10-CM | POA: Insufficient documentation

## 2015-12-10 DIAGNOSIS — W19XXXA Unspecified fall, initial encounter: Secondary | ICD-10-CM | POA: Insufficient documentation

## 2015-12-10 DIAGNOSIS — Y939 Activity, unspecified: Secondary | ICD-10-CM | POA: Diagnosis not present

## 2015-12-10 DIAGNOSIS — S0990XA Unspecified injury of head, initial encounter: Secondary | ICD-10-CM | POA: Insufficient documentation

## 2015-12-10 DIAGNOSIS — Z79891 Long term (current) use of opiate analgesic: Secondary | ICD-10-CM | POA: Diagnosis not present

## 2015-12-10 DIAGNOSIS — Z79899 Other long term (current) drug therapy: Secondary | ICD-10-CM | POA: Diagnosis not present

## 2015-12-10 DIAGNOSIS — D649 Anemia, unspecified: Secondary | ICD-10-CM | POA: Diagnosis not present

## 2015-12-10 DIAGNOSIS — S0083XA Contusion of other part of head, initial encounter: Secondary | ICD-10-CM | POA: Diagnosis not present

## 2015-12-10 DIAGNOSIS — S199XXA Unspecified injury of neck, initial encounter: Secondary | ICD-10-CM | POA: Diagnosis not present

## 2015-12-10 LAB — BASIC METABOLIC PANEL
Anion gap: 10 (ref 5–15)
BUN: 12 mg/dL (ref 6–20)
CALCIUM: 8.5 mg/dL — AB (ref 8.9–10.3)
CHLORIDE: 95 mmol/L — AB (ref 101–111)
CO2: 23 mmol/L (ref 22–32)
Creatinine, Ser: 0.85 mg/dL (ref 0.61–1.24)
GFR calc non Af Amer: >60 mL/min (ref >60)
Glucose, Bld: 119 mg/dL — ABNORMAL HIGH (ref 65–99)
Potassium: 3.8 mmol/L (ref 3.5–5.1)
Sodium: 128 mmol/L — ABNORMAL LOW (ref 135–145)

## 2015-12-10 LAB — URINALYSIS, ROUTINE W REFLEX MICROSCOPIC
Bilirubin Urine: NEGATIVE
GLUCOSE, UA: NEGATIVE mg/dL
HGB URINE DIPSTICK: NEGATIVE
KETONES UR: 15 mg/dL — AB
LEUKOCYTES UA: NEGATIVE
Nitrite: NEGATIVE
PROTEIN: NEGATIVE mg/dL
Specific Gravity, Urine: 1.009 (ref 1.005–1.030)
pH: 6 (ref 5.0–8.0)

## 2015-12-10 LAB — CBC
HCT: 33.1 % — ABNORMAL LOW (ref 39.0–52.0)
Hemoglobin: 11.9 g/dL — ABNORMAL LOW (ref 13.0–17.0)
MCH: 29.2 pg (ref 26.0–34.0)
MCHC: 36 g/dL (ref 30.0–36.0)
MCV: 81.3 fL (ref 78.0–100.0)
PLATELETS: 205 10*3/uL (ref 150–400)
RBC: 4.07 MIL/uL — AB (ref 4.22–5.81)
RDW: 12.5 % (ref 11.5–15.5)
WBC: 9.8 10*3/uL (ref 4.0–10.5)

## 2015-12-10 LAB — TYPE AND SCREEN
ABO/RH(D): O POS
Antibody Screen: NEGATIVE

## 2015-12-10 LAB — ABO/RH: ABO/RH(D): O POS

## 2015-12-10 LAB — CBG MONITORING, ED: GLUCOSE-CAPILLARY: 117 mg/dL — AB (ref 65–99)

## 2015-12-10 MED ORDER — SODIUM CHLORIDE 0.9 % IV BOLUS (SEPSIS)
2000.0000 mL | Freq: Once | INTRAVENOUS | Status: AC
  Administered 2015-12-10: 2000 mL via INTRAVENOUS

## 2015-12-10 MED ORDER — SODIUM CHLORIDE 0.9 % IV BOLUS (SEPSIS): 2000.0000 mL | Freq: Once | INTRAVENOUS | Status: DC

## 2015-12-10 NOTE — ED Notes (Signed)
Pt states that he had hemrrhoid surgery 1 week ago and has since had 2 syncopal episodes in the past 24 hours. States he has had rectal bleeding since. Hematomas on back of head and L eyebrow area. Alert and oriented at this time. C/O neck pain at this time.

## 2015-12-10 NOTE — ED Provider Notes (Signed)
CSN: [id number]     Arrival date & time 12/10/15  1816 History   First MD Initiated Contact with Patient 12/10/15 1955     Chief Complaint  Patient presents with  . Loss of Consciousness     (Consider location/radiation/quality/duration/timing/severity/associated sxs/prior Treatment) HPI   Tommy Page is a 55 y.o. male who presents for evaluation of weakness, dizziness, and several falls. This is associated with passing bloody, diarrhea, and recent hemorrhoid surgery. He is using stool softeners frequently. He denies fever, cough, paresthesia or focal weakness. He injured his lower back, and his left face and fall. He complains of neck pain. He does not take anticoagulants. He was started on Flomax, 2 days ago after an episode of urinary retention requiring catheterization. He states that he is urinating well at this time. There are no other known modifying factors.   Past Medical History  Diagnosis Date  . Anxiety   . Depression   . GERD (gastroesophageal reflux disease)   . HOH (hard of hearing)     BOTH EARS  . Hemorrhoids    Past Surgical History  Procedure Laterality Date  . Basal cell carcinoma removed  2013    LEFT TEMPLE  . Nasal septum repair  2005  . Lasix eye surgery  2005  . Transanal hemorrhoidal dearterialization N/A 12/03/2015    Procedure: TRANSANAL HEMORRHOIDAL DEARTERIALIZATION LIGATION/PEXY  EUA HEMORRHOIDECTOMY ;  Surgeon: Karie Soda, MD;  Location: [facility];  Service: General;  Laterality: N/A;   History reviewed. No pertinent family history. Social History  Substance Use Topics  . Smoking status: Former Smoker    Quit date: 06/26/2015  . Smokeless tobacco: Never Used  . Alcohol Use: Yes     Comment: OCCASIONAL    Review of Systems  All other systems reviewed and are negative.     Allergies  Zoloft  Home Medications   Prior to Admission medications   Medication Sig Start Date End Date Taking? Authorizing Provider  docusate sodium (COLACE)  100 MG capsule Take 100 mg by mouth daily as needed for mild constipation.   Yes Historical Provider, MD  hydrocortisone (ANUSOL-HC) 2.5 % rectal cream Place 1 application rectally 2 (two) times daily. Apply around anus for irritated & painful hemorrhoids 12/03/15  Yes Karie Soda, MD  naproxen sodium (ALEVE) 220 MG tablet Take 440 mg by mouth every 8 (eight) hours as needed (For pain.).   Yes Historical Provider, MD  nicotine (NICODERM CQ - DOSED IN MG/24 HOURS) 21 mg/24hr patch Place 21 mg onto the skin daily.   Yes Historical Provider, MD  nicotine polacrilex (NICORETTE) 2 MG gum Take 2 mg by mouth as needed for smoking cessation.   Yes Historical Provider, MD  omeprazole (PRILOSEC OTC) 20 MG tablet Take 20 mg by mouth daily.   Yes Historical Provider, MD  oxyCODONE (OXY IR/ROXICODONE) 5 MG immediate release tablet Take 1-2 tablets (5-10 mg total) by mouth every 4 (four) hours as needed for moderate pain, severe pain or breakthrough pain. 12/03/15  Yes Karie Soda, MD  simvastatin (ZOCOR) 20 MG tablet Take 20 mg by mouth at bedtime.    Yes Historical Provider, MD  Wheat Dextrin (BENEFIBER) POWD Take 10 mLs by mouth 2 (two) times daily.   Yes Historical Provider, MD   BP 122/76 mmHg  Pulse 81  Temp(Src) 97.9 F (36.6 C) (Oral)  Resp 16  SpO2 100% Physical Exam  Constitutional: He is oriented to person, place, and time. He appears well-developed and  well-nourished.  HENT:  Head: Normocephalic.  Right Ear: External ear normal.  Left Ear: External ear normal.  Small contusion with abrasions of left lateral eyebrow region. No associated crepitation.  Eyes: Conjunctivae and EOM are normal. Pupils are equal, round, and reactive to light.  Neck: Normal range of motion and phonation normal. Neck supple.  Cardiovascular: Normal rate, regular rhythm and normal heart sounds.   Pulmonary/Chest: Effort normal and breath sounds normal. No respiratory distress. He exhibits no bony tenderness.   Abdominal: Soft. There is no tenderness.  Genitourinary:  Anus. Examination- sutured wound present left buttock, no significant anal deformity, and no bleeding per anus. Digital examination deferred  Musculoskeletal: Normal range of motion.  Contusion mid lower back with small abrasion.  Neurological: He is alert and oriented to person, place, and time. No cranial nerve deficit or sensory deficit. He exhibits normal muscle tone. Coordination normal.  Skin: Skin is warm, dry and intact.  Psychiatric: He has a normal mood and affect. His behavior is normal. Judgment and thought content normal.  Nursing note and vitals reviewed.   ED Course  Procedures (including critical care time)  Medications  sodium chloride 0.9 % bolus 2,000 mL (2,000 mLs Intravenous New Bag/Given 12/10/15 2020)    Patient Vitals for the past 24 hrs:  BP Temp Temp src Pulse Resp SpO2  12/10/15 2249 122/76 mmHg - - 81 16 100 %  12/10/15 2048 119/84 mmHg - - 74 17 100 %  12/10/15 2018 107/73 mmHg - - 78 - 100 %  12/10/15 1827 115/82 mmHg 97.9 F (36.6 C) Oral 84 14 98 %    21:10- findings discussed with general surgery, Dr. Michaell Cowing, who recommends outpatient follow-up in the office.  9:29 PM Reevaluation with update and discussion. After initial assessment and treatment, an updated evaluation reveals he is feeling better at this time. Findings discussed with patient and mother, all questions answered. [place]   11:00 p.m.- orthostatic blood pressure and pulse are improved. Patient is comfortable. Findings discussed with patient, all questions answered  Labs Review Labs Reviewed  BASIC METABOLIC PANEL - Abnormal; Notable for the following:    Sodium 128 (*)    Chloride 95 (*)    Glucose, Bld 119 (*)    Calcium 8.5 (*)    All other components within normal limits  CBC - Abnormal; Notable for the following:    RBC 4.07 (*)    Hemoglobin 11.9 (*)    HCT 33.1 (*)    All other components within normal  limits  URINALYSIS, ROUTINE W REFLEX MICROSCOPIC (NOT AT [facility]) - Abnormal; Notable for the following:    Ketones, ur 15 (*)    All other components within normal limits  CBG MONITORING, ED - Abnormal; Notable for the following:    Glucose-Capillary 117 (*)    All other components within normal limits  TYPE AND SCREEN  ABO/RH    Imaging Review Ct Head Wo Contrast  12/10/2015  CLINICAL DATA:  Two syncopal episodes in the past 24 hours, hematoma is at back of head and LEFT supraorbital, alert and oriented, neck pain on LEFT radiating to LEFT shoulder ; hemorrhoid surgery 1 week ago with rectal bleeding since EXAM: CT HEAD WITHOUT CONTRAST CT CERVICAL SPINE WITHOUT CONTRAST TECHNIQUE: Multidetector CT imaging of the head and cervical spine was performed following the standard protocol without intravenous contrast. Multiplanar CT image reconstructions of the cervical spine were also generated. COMPARISON:  None FINDINGS: CT HEAD FINDINGS Normal  ventricular morphology. No midline shift or mass effect. Normal appearance of brain parenchyma. No intracranial hemorrhage, mass lesion, or evidence acute infarction. No extra-axial fluid collections. Dependent mucus within sphenoid sinus. Tiny mucosal retention cyst RIGHT maxillary sinus. Remaining paranasal sinuses and mastoid air cells clear. Osseous structures appear intact. CT CERVICAL SPINE FINDINGS Prevertebral soft tissues normal thickness. Osseous mineralization grossly normal. Vertebral body and disc space heights maintained. No acute fracture, subluxation, or bone destruction. Visualized skullbase intact. Lung apices clear. IMPRESSION: No acute intracranial abnormalities. No acute cervical spine abnormalities. Electronically Signed   By: Ulyses SouthwardMark  Boles M.D.   On: 12/10/2015 20:50   Ct Cervical Spine Wo Contrast  12/10/2015  CLINICAL DATA:  Two syncopal episodes in the past 24 hours, hematoma is at back of head and LEFT supraorbital, alert and oriented, neck  pain on LEFT radiating to LEFT shoulder ; hemorrhoid surgery 1 week ago with rectal bleeding since EXAM: CT HEAD WITHOUT CONTRAST CT CERVICAL SPINE WITHOUT CONTRAST TECHNIQUE: Multidetector CT imaging of the head and cervical spine was performed following the standard protocol without intravenous contrast. Multiplanar CT image reconstructions of the cervical spine were also generated. COMPARISON:  None FINDINGS: CT HEAD FINDINGS Normal ventricular morphology. No midline shift or mass effect. Normal appearance of brain parenchyma. No intracranial hemorrhage, mass lesion, or evidence acute infarction. No extra-axial fluid collections. Dependent mucus within sphenoid sinus. Tiny mucosal retention cyst RIGHT maxillary sinus. Remaining paranasal sinuses and mastoid air cells clear. Osseous structures appear intact. CT CERVICAL SPINE FINDINGS Prevertebral soft tissues normal thickness. Osseous mineralization grossly normal. Vertebral body and disc space heights maintained. No acute fracture, subluxation, or bone destruction. Visualized skullbase intact. Lung apices clear. IMPRESSION: No acute intracranial abnormalities. No acute cervical spine abnormalities. Electronically Signed   By: Ulyses SouthwardMark  Boles M.D.   On: 12/10/2015 20:50   I have personally reviewed and evaluated these images and lab results as part of my medical decision-making.   EKG Interpretation   Date/Time:  Wednesday Dec 10 2015 18:27:03 EDT Ventricular Rate:  85 PR Interval:  118 QRS Duration: 77 QT Interval:  359 QTC Calculation: 427 R Axis:   66 Text Interpretation:  Sinus rhythm Borderline short PR interval No old  tracing to compare Confirmed by [facility] InstituteWENTZ  MD, Brantlee Penn [phone]([id number]) on 12/10/2015  8:57:06 PM      MDM   Final diagnoses:  Near syncope  Anemia, unspecified anemia type  Head injury, initial encounter  Neck injury, initial encounter    Near-syncope with falls, multiple, with anemia related to recent surgery, hemorrhoids. No  serious intracranial injury. Mild anemia related to recent surgery, complicated by use of Flomax for urinary retention; together contributed to his near syncope. He is improved with IV fluids stable for discharge.   Nursing Notes Reviewed/ Care Coordinated Applicable Imaging Reviewed Interpretation of Laboratory Data incorporated into ED treatment  The patient appears reasonably screened and/or stabilized for discharge and I doubt any other medical condition or other [facility] requiring further screening, evaluation, or treatment in the ED at this time prior to discharge.  Plan: Home Medications- stop Flomax; Home Treatments- rest, fluids; return here if the recommended treatment, does not improve the symptoms; Recommended follow up- general surgery as scheduled, and needed.     Mancel BaleElliott Coltrane Tugwell, MD 12/10/15 [phone]

## 2015-12-10 NOTE — ED Notes (Signed)
Bed: WA20 Expected date:  Expected time:  Means of arrival:  Comments: EMS/FALL-orthostatic

## 2015-12-10 NOTE — ED Notes (Signed)
Patient transported to CT 

## 2015-12-10 NOTE — ED Notes (Signed)
Patient was alert, oriented and stable upon discharge. RN went over AVS and patient had no further questions.  

## 2015-12-10 NOTE — Discharge Instructions (Signed)
Continue to drink plenty of water. Stop taking the Flomax. Return here if needed, for problems.    Anemia, Nonspecific Anemia is a condition in which the concentration of red blood cells or hemoglobin in the blood is below normal. Hemoglobin is a substance in red blood cells that carries oxygen to the tissues of the body. Anemia results in not enough oxygen reaching these tissues.  CAUSES  Common causes of anemia include:  1. Excessive bleeding. Bleeding may be internal or external. This includes excessive bleeding from periods (in women) or from the intestine.  2. Poor nutrition.  3. Chronic kidney, thyroid, and liver disease. 4. Bone marrow disorders that decrease red blood cell production. 5. Cancer and treatments for cancer. 6. HIV, AIDS, and their treatments. 7. Spleen problems that increase red blood cell destruction. 8. Blood disorders. 9. Excess destruction of red blood cells due to infection, medicines, and autoimmune disorders. SIGNS AND SYMPTOMS   Minor weakness.   Dizziness.   Headache.  Palpitations.   Shortness of breath, especially with exercise.   Paleness.  Cold sensitivity.  Indigestion.  Nausea.  Difficulty sleeping.  Difficulty concentrating. Symptoms may occur suddenly or they may develop slowly.  DIAGNOSIS  Additional blood tests are often needed. These help your health care provider determine the best treatment. Your health care provider will check your stool for blood and look for other causes of blood loss.  TREATMENT  Treatment varies depending on the cause of the anemia. Treatment can include:   Supplements of iron, vitamin B12, or folic acid.   Hormone medicines.   A blood transfusion. This may be needed if blood loss is severe.   Hospitalization. This may be needed if there is significant continual blood loss.   Dietary changes.  Spleen removal. HOME CARE INSTRUCTIONS Keep all follow-up appointments. It often takes  many weeks to correct anemia, and having your health care provider check on your condition and your response to treatment is very important. SEEK IMMEDIATE MEDICAL CARE IF:   You develop extreme weakness, shortness of breath, or chest pain.   You become dizzy or have trouble concentrating.  You develop heavy vaginal bleeding.   You develop a rash.   You have bloody or black, tarry stools.   You faint.   You vomit up blood.   You vomit repeatedly.   You have abdominal pain.  You have a fever or persistent symptoms for more than 2-3 days.   You have a fever and your symptoms suddenly get worse.   You are dehydrated.  MAKE SURE YOU:  Understand these instructions.  Will watch your condition.  Will get help right away if you are not doing well or get worse.   This information is not intended to replace advice given to you by your health care provider. Make sure you discuss any questions you have with your health care provider.   Document Released: 08/19/2004 Document Revised: 03/14/2013 Document Reviewed: 01/05/2013 Elsevier Interactive Patient Education 2016 [employer]  ANORECTAL SURGERY:  POST OPERATIVE INSTRUCTIONS  1. Take your usually prescribed home medications unless otherwise directed. 2. DIET: Follow a light bland diet the first 24 hours after arrival home, such as soup, liquids, crackers, etc.  Be sure to include lots of fluids daily.  Avoid fast food or heavy meals as your are more likely to get nauseated.  Eat a low fat the next few days after surgery.   3. PAIN CONTROL: a. Pain is best controlled by a  usual combination of three different methods TOGETHER: i. Ice/Heat ii. Over the counter pain medication iii. Prescription pain medication b. Most patients will experience some swelling and discomfort in the anus/rectal area. and incisions.  Ice packs or heat (30-60 minutes up to 6 times a day) will help. Use ice for the first few days to help  decrease swelling and bruising, then switch to heat such as warm towels, sitz baths, warm baths, etc to help relax tight/sore spots and speed recovery.  Some people prefer to use ice alone, heat alone, alternating between ice & heat.  Experiment to what works for you.  Swelling and bruising can take several weeks to resolve.   c. It is helpful to take an over-the-counter pain medication regularly for the first few weeks.  Choose one of the following that works best for you: i. Naproxen (Aleve, etc)  Two 220mg  tabs twice a day ii. Ibuprofen (Advil, etc) Three 200mg  tabs four times a day (every meal & bedtime) iii. Acetaminophen (Tylenol, etc) 500-650mg  four times a day (every meal & bedtime) d. A  prescription for pain medication (such as oxycodone, hydrocodone, etc) should be given to you upon discharge.  Take your pain medication as prescribed.  i. If you are having problems/concerns with the prescription medicine (does not control pain, nausea, vomiting, rash, itching, etc), please call us [phone] to see if we need to switch you to a different pain medicine that will work better for you and/or control your side effect better. ii. If you need a refill on your pain medication, please contact your pharmacy.  They will contact our office to request authorization. Prescriptions will not be filled after 5 pm or on week-ends.  Use a Sitz Bath 4-8 times a day for relief   [employer] A sitz bath is a warm water bath taken in the sitting position that covers only the hips and buttocks. It may be used for either healing or hygiene purposes. Sitz baths are also used to relieve pain, itching, or muscle spasms. The water may contain medicine. Moist heat will help you heal and relax.  HOME CARE INSTRUCTIONS  Take 3 to 4 sitz baths a day. 10. Fill the bathtub half full with warm water. 11. Sit in the water and open the drain a little. 12. Turn on the warm water to keep the tub half full. Keep the water  running constantly. 13. Soak in the water for 15 to 20 minutes. 14. After the sitz bath, pat the affected area dry first.   4. KEEP YOUR BOWELS REGULAR a. The goal is one bowel movement a day b. Avoid getting constipated.  Between the surgery and the pain medications, it is common to experience some constipation.  Increasing fluid intake and taking a fiber supplement (such as Metamucil, Citrucel, FiberCon, MiraLax, etc) 1-2 times a day regularly will usually help prevent this problem from occurring.  A mild laxative (prune juice, Milk of Magnesia, MiraLax, etc) should be taken according to package directions if there are no bowel movements after 48 hours. c. Watch out for diarrhea.  If you have many loose bowel movements, simplify your diet to bland foods & liquids for a few days.  Stop any stool softeners and decrease your fiber supplement.  Switching to mild anti-diarrheal medications (Kayopectate, Pepto Bismol) can help.  If this worsens or does not improve, please call us.  5. Wound Care  a. Remove your bandages the day after surgery.  Unless discharge  instructions indicate otherwise, leave your bandage dry and in place overnight.  Remove the bandage during your first bowel movement.   b. Wear an absorbent pad or soft cotton gauze in your underwear as needed to catch any drainage and help keep the area  c. Keep the area clean and dry.  Bathe / shower every day.  Keep the area clean by showering / bathing over the incision / wound.   It is okay to soak an open wound to help wash it.  Wet wipes or showers / gentle washing after bowel movements is often less traumatic than regular toilet paper. d. Bonita Quin will often notice bleeding with bowel movements.  This should slow down by the end of the first week of surgery e. Expect some drainage.  This should slow down, too, by the end of the first week of surgery.  Wear an absorbent pad or soft cotton gauze in your underwear until the drainage  stops.  6. ACTIVITIES as tolerated:   a. You may resume regular (light) daily activities beginning the next day--such as daily self-care, walking, climbing stairs--gradually increasing activities as tolerated.  If you can walk 30 minutes without difficulty, it is safe to try more intense activity such as jogging, treadmill, bicycling, low-impact aerobics, swimming, etc. b. Save the most intensive and strenuous activity for last such as sit-ups, heavy lifting, contact sports, etc  Refrain from any heavy lifting or straining until you are off narcotics for pain control.   c. DO NOT PUSH THROUGH PAIN.  Let pain be your guide: If it hurts to do something, don't do it.  Pain is your body warning you to avoid that activity for another week until the pain goes down. d. You may drive when you are no longer taking prescription pain medication, you can comfortably sit for long periods of time, and you can safely maneuver your car and apply brakes. e. Bonita Quin may have sexual intercourse when it is comfortable.  7. FOLLOW UP in our office a. Please call [facility] at [phone] to set up an appointment to see your surgeon in the office for a follow-up appointment approximately 2 weeks after your surgery. b. Make sure that you call for this appointment the day you arrive home to insure a convenient appointment time. 10. IF YOU HAVE DISABILITY OR FAMILY LEAVE FORMS, BRING THEM TO THE OFFICE FOR PROCESSING.  DO NOT GIVE THEM TO YOUR DOCTOR.        WHEN TO CALL us [phone]: 1. Poor pain control 2. Reactions / problems with new medications (rash/itching, nausea, etc)  3. Fever over 101.5 F (38.5 C) 4. Inability to urinate 5. Nausea and/or vomiting 6. Worsening swelling or bruising 7. Continued bleeding from incision. 8. Increased pain, redness, or drainage from the incision  The clinic staff is available to answer your questions during regular business hours (8:30am-5pm).  Please dont hesitate to call  and ask to speak to one of our nurses for clinical concerns.   A surgeon from [facility] is always on call at the hospitals   If you have a medical emergency, go to the nearest emergency room or call 911.    [facility] [address], [address], [place], [place]  [address] ? MAIN: [phone] ? TOLL FREE: [phone] ? FAX [phone] [email or web address]    HEMORRHOIDS  The rectum is the last foot of your colon, and it naturally stretches to hold stool.  Hemorrhoidal piles  are natural clusters of blood vessels that help the rectum and anal canal stretch to hold stool and allow bowel movements to eliminate feces.   Hemorrhoids are abnormally swollen blood vessels in the rectum.  Too much pressure in the rectum causes hemorrhoids by forcing blood to stretch and bulge the walls of the veins, sometimes even rupturing them.  Hemorrhoids can become like varicose veins you might see on a person's legs.  Most people will develop a flare of hemorrhoids in their lifetime.  When bulging hemorrhoidal veins are irritated, they can swell, burn, itch, cause pain, and bleed.  Most flares will calm down gradually own within a few weeks.  However, once hemorrhoids are created, they are difficult to get rid of completely and tend to flare more easily than the first flare.   Fortunately, good habits and simple medical treatment usually control hemorrhoids well, and surgery is needed only in severe cases. Types of Hemorrhoids:  Internal hemorrhoids usually don't initially hurt or itch; they are deep inside the rectum and usually have no sensation. If they begin to push out (prolapse), pain and burning can occur.  However, internal hemorrhoids can bleed.  Anal bleeding should not be ignored since bleeding could come from a dangerous source like colorectal cancer, so persistent rectal bleeding should be investigated by a doctor, sometimes with a colonoscopy.   External hemorrhoids cause most of the symptoms - pain, burning, and itching. Nonirritated hemorrhoids can look like small skin tags coming out of the anus.   Thrombosed hemorrhoids can form when a hemorrhoid blood vessel bursts and causes the hemorrhoid to suddenly swell.  A purple blood clot can form in it and become an excruciatingly painful lump at the anus. Because of these unpleasant symptoms, immediate incision and drainage by a surgeon at an office visit can provide much relief of the pain.    PREVENTION Avoiding the most frequent causes listed below will prevent most cases of hemorrhoids: Constipation Hard stools Diarrhea  Constant sitting  Straining with bowel movements Sitting on the toilet for a long time  Severe coughing  episodes Pregnancy / Childbirth  Heavy Lifting  Sometimes avoiding the above triggers is difficult:  How can you avoid sitting all day if you have a seated job? Also, we try to avoid coughing and diarrhea, but sometimes its beyond your control.  Still, there are some practical hints to help: Keep the anal and genital area clean.  Moistened tissues such as flushable wet wipes are less irritating than toilet paper.  Using irrigating showers or bottle irrigation washing gently cleans this sensitive area.   Avoid dry toilet paper when cleaning after bowel movements.  Marland Kitchen. Keep the anal and genital area dry.  Lightly pat the rectal area dry.  Avoid rubbing.  Talcum or baby powders can help GET YOUR STOOLS SOFT.   This is the most important way to prevent irritated hemorrhoids.  Hard stools are like sandpaper to the anorectal canal and will cause more problems.  The goal: ONE SOFT BOWEL MOVEMENT A DAY!  BMs from every other day to 3 times a day is a tolerable range Treat coughing, diarrhea and constipation early since irritated hemorrhoids may soon follow.  If your main job activity is seated, always stand or walk during your breaks. Make it a point to stand and walk at  least 5 minutes every hour and try to shift frequently in your chair to avoid direct rectal pressure.  Always exhale as you strain or  lift. Don't hold your breath.  Do not delay or try to prevent a bowel movement when the urge is present. Exercise regularly (walking or jogging 60 minutes a day) to stimulate the bowels to move. No reading or other activity while on the toilet. If bowel movements take longer than 5 minutes, you are too constipated. AVOID CONSTIPATION Drink plenty of liquids (1 1/2 to 2 quarts of water and other fluids a day unless fluid restricted for another medical condition). Liquids that contain caffeine (coffee a, tea, soft drinks) can be dehydrating and should be avoided until constipation is controlled. Consider minimizing milk, as dairy products may be constipating. Eat plenty of fiber (30g a day ideal, more if needed).  Fiber is the undigested part of plant food that passes into the colon, acting as natures broom to encourage bowel motility and movement.  Fiber can absorb and hold large amounts of water. This results in a larger, bulkier stool, which is soft and easier to pass.  Eating foods high in fiber - 12 servings - such as  Vegetables: Root (potatoes, carrots, turnips), Leafy green (lettuce, salad greens, celery, spinach), High residue (cabbage, broccoli, etc.) Fruit: Fresh, Dried (prunes, apricots, cherries), Stewed (applesauce)  Whole grain breads, pasta, whole wheat Bran cereals, muffins, etc. Consider adding supplemental bulking fiber which retains large volumes of water: Psyllium ground seeds (native plant from [place])--available as Metamucil, Konsyl, Effersyllium, Per Diem Fiber, or the less expensive generic forms.  Citrucel  (methylcellulose wood fiber) . FiberCon (Polycarbophil) Polyethylene Glycol - and artificial fiber commonly called Miralax or Glycolax.  It is helpful for people with gassy or bloated feelings with regular fiber Flax Seed - a less  gassy natural fiber  Laxatives can be useful for a short period if constipation is severe Osmotics (Milk of Magnesia, Fleets Phospho-Soda, Magnesium Citrate)  Stimulants (Senokot,   Castor Oil,  Dulcolax, Ex-Lax)    Laxatives are not a good long-term solution as it can stress the bowels and cause too much mineral loss and dehydration.   Avoid taking laxatives for more than 7 days in a row.  AVOID DIARRHEA Switch to liquids and simpler foods for a few days to avoid stressing your intestines further. Avoid dairy products (especially milk & ice cream) for a short time.  The intestines often can lose the ability to digest lactose when stressed. Avoid foods that cause gassiness or bloating.  Typical foods include beans and other legumes, cabbage, broccoli, and dairy foods.  Every person has some sensitivity to other foods, so listen to your body and avoid those foods that trigger problems for you. Adding fiber (Citrucel, Metamucil, FiberCon, Flax seed, Miralax) gradually can help thicken stools by absorbing excess fluid and retrain the intestines to act more normally.  Slowly increase the dose over a few weeks.  Too much fiber too soon can backfire and cause cramping & bloating. Probiotics (such as active yogurt, Align, etc) may help repopulate the intestines and colon with normal bacteria and calm down a sensitive digestive tract.  Most studies show it to be of mild help, though, and such products can be costly. Medicines: Bismuth subsalicylate (ex. Kayopectate, Pepto Bismol) every 30 minutes for up to 6 doses can help control diarrhea.  Avoid if pregnant. Loperamide (Immodium) can slow down diarrhea.  Start with two tablets (4mg  total) first and then try one tablet every 6 hours.  Avoid if you are having fevers or severe pain.  If you are not better or start  feeling worse, stop all medicines and call your doctor for advice Call your doctor if you are getting worse or not better.  Sometimes further  testing (cultures, endoscopy, X-ray studies, bloodwork, etc) may be needed to help diagnose and treat the cause of the diarrhea.  TROUBLESHOOTING IRREGULAR BOWELS 1) Avoid extremes of bowel movements (no bad constipation/diarrhea) 2) Miralax 17gm mixed in 8oz. water or juice-daily. May use BID as needed.  3) Gas-x,Phazyme, etc. as needed for gas & bloating.  4) Soft,bland diet. No spicy,greasy,fried foods.  5) Prilosec over-the-counter as needed  6) May hold gluten/wheat products from diet to see if symptoms improve.  7)  May try probiotics (Align, Activa, etc) to help calm the bowels down 7) If symptoms become worse call back immediately.   TREATMENT OF HEMORRHOID FLARE If these preventive measures fail, you must take action right away! Hemorrhoids are one condition that can be mild in the morning and become intolerable by nightfall. Most hemorrhoidal flares take several weeks to calm down.  These suggestions can help: Warm soaks.  This helps more than any topical medication.  Use up to 8 times a day.  Usually sitz baths or sitting in a warm bathtub helps.  Sitting on moist warm towels are helpful.  Switching to ice packs/cool compresses can be helpful  Use a Sitz Bath 4-8 times a day for relief A sitz bath is a warm water bath taken in the sitting position that covers only the hips and buttocks. It may be used for either healing or hygiene purposes. Sitz baths are also used to relieve pain, itching, or muscle spasms. The water may contain medicine. Moist heat will help you heal and relax.  HOME CARE INSTRUCTIONS  Take 3 to 4 sitz baths a day. 15. Fill the bathtub half full with warm water. 16. Sit in the water and open the drain a little. 17. Turn on the warm water to keep the tub half full. Keep the water running constantly. 18. Soak in the water for 15 to 20 minutes. 19. After the sitz bath, pat the affected area dry first. SEEK MEDICAL CARE IF:  You get worse instead of better. Stop  the sitz baths if you get worse.  Normalize your bowels.  Extremes of diarrhea or constipation will make hemorrhoids worse.  One soft bowel movement a day is the goal.  Fiber can help get your bowels regular Wet wipes instead of toilet paper Pain control with a NSAID such as ibuprofen (Advil) or naproxen (Aleve) or acetaminophen (Tylenol) around the clock.  Narcotics are constipating and should be minimized if possible Topical creams contain steroids (bydrocortisone) or local anesthetic (xylocaine) can help make pain and itching more tolerable.   EVALUATION If hemorrhoids are still causing problems, you could benefit by an evaluation by a surgeon.  The surgeon will obtain a history and examine you.  If hemorrhoids are diagnosed, some therapies can be offered in the office, usually with an anoscope into the less sensitive area of the rectum: -injection of hemorrhoids (sclerotherapy) can scar the blood vessels of the swollen/enlarged hemorrhoids to help shrink them down to a more normal size -rubber banding of the enlarged hemorrhoids to help shrink them down to a more normal size -drainage of the blood clot causing a thrombosed hemorrhoid,  to relieve the severe pain   While 90% of the time such problems from hemorrhoids can be managed without preceding to surgery, sometimes the hemorrhoids require a operation to control the problem (  uncontrolled bleeding, prolapse, pain, etc.).   This involves being placed under general anesthesia where the surgeon can confirm the diagnosis and remove, suture, or staple the hemorrhoid(s).  Your surgeon can help you treat the problem appropriately.    GETTING TO GOOD BOWEL HEALTH. Irregular bowel habits such as constipation and diarrhea can lead to many problems over time.  Having one soft bowel movement a day is the most important way to prevent further problems.  The anorectal canal is designed to handle stretching and feces to safely manage our ability to get rid of  solid waste (feces, poop, stool) out of our body.  BUT, hard constipated stools can act like ripping concrete bricks and diarrhea can be a burning fire to this very sensitive area of our body, causing inflamed hemorrhoids, anal fissures, increasing risk is perirectal abscesses, abdominal pain/bloating, an making irritable bowel worse.      The goal: ONE SOFT BOWEL MOVEMENT A DAY!  To have soft, regular bowel movements:   Drink plenty of fluids, consider 4-6 tall glasses of water a day.    Take plenty of fiber.  Fiber is the undigested part of plant food that passes into the colon, acting s natures broom to encourage bowel motility and movement.  Fiber can absorb and hold large amounts of water. This results in a larger, bulkier stool, which is soft and easier to pass. Work gradually over several weeks up to 6 servings a day of fiber (25g a day even more if needed) in the form of: o Vegetables -- Root (potatoes, carrots, turnips), leafy green (lettuce, salad greens, celery, spinach), or cooked high residue (cabbage, broccoli, etc) o Fruit -- Fresh (unpeeled skin & pulp), Dried (prunes, apricots, cherries, etc ),  or stewed ( applesauce)  o Whole grain breads, pasta, etc (whole wheat)  o Bran cereals   Bulking Agents -- This type of water-retaining fiber generally is easily obtained each day by one of the following:  o Psyllium bran -- The psyllium plant is remarkable because its ground seeds can retain so much water. This product is available as Metamucil, Konsyl, Effersyllium, Per Diem Fiber, or the less expensive generic preparation in drug and health food stores. Although labeled a laxative, it really is not a laxative.  o Methylcellulose -- This is another fiber derived from wood which also retains water. It is available as Citrucel. o Polyethylene Glycol - and artificial fiber commonly called Miralax or Glycolax.  It is helpful for people with gassy or bloated feelings with regular  fiber o Flax Seed - a less gassy fiber than psyllium  No reading or other relaxing activity while on the toilet. If bowel movements take longer than 5 minutes, you are too constipated  AVOID CONSTIPATION.  High fiber and water intake usually takes care of this.  Sometimes a laxative is needed to stimulate more frequent bowel movements, but   Laxatives are not a good long-term solution as it can wear the colon out.  They can help jump-start bowels if constipated, but should be relied on constantly without discussing with your doctor o Osmotics (Milk of Magnesia, Fleets phosphosoda, Magnesium citrate, MiraLax, GoLytely) are safer than  o Stimulants (Senokot, Castor Oil, Dulcolax, Ex Lax)    o Avoid taking laxatives for more than 7 days in a row.   IF SEVERELY CONSTIPATED, try a Bowel Retraining Program: o Do not use laxatives.  o Eat a diet high in roughage, such as bran cereals and leafy vegetables.  o Drink six (6) ounces of prune or apricot juice each morning.  o Eat two (2) large servings of stewed fruit each day.  o Take one (1) heaping tablespoon of a psyllium-based bulking agent twice a day. Use sugar-free sweetener when possible to avoid excessive calories.  o Eat a normal breakfast.  o Set aside 15 minutes after breakfast to sit on the toilet, but do not strain to have a bowel movement.  o If you do not have a bowel movement by the third day, use an enema and repeat the above steps.   Controlling diarrhea o Switch to liquids and simpler foods for a few days to avoid stressing your intestines further. o Avoid dairy products (especially milk & ice cream) for a short time.  The intestines often can lose the ability to digest lactose when stressed. o Avoid foods that cause gassiness or bloating.  Typical foods include beans and other legumes, cabbage, broccoli, and dairy foods.  Every person has some sensitivity to other foods, so listen to our body and avoid those foods that trigger  problems for you. o Adding fiber (Citrucel, Metamucil, psyllium, Miralax) gradually can help thicken stools by absorbing excess fluid and retrain the intestines to act more normally.  Slowly increase the dose over a few weeks.  Too much fiber too soon can backfire and cause cramping & bloating. o Probiotics (such as active yogurt, Align, etc) may help repopulate the intestines and colon with normal bacteria and calm down a sensitive digestive tract.  Most studies show it to be of mild help, though, and such products can be costly. o Medicines: - Bismuth subsalicylate (ex. Kayopectate, Pepto Bismol) every 30 minutes for up to 6 doses can help control diarrhea.  Avoid if pregnant. - Loperamide (Immodium) can slow down diarrhea.  Start with two tablets (  total) first and then try one tablet every 6 hours.  Avoid if you are having fevers or severe pain.  If you are not better or start feeling worse, stop all medicines and call your doctor for advice o Call your doctor if you are getting worse or not better.  Sometimes further testing (cultures, endoscopy, X-ray studies, bloodwork, etc) may be needed to help diagnose and treat the cause of the diarrhea.  TROUBLESHOOTING IRREGULAR BOWELS 1) Avoid extremes of bowel movements (no bad constipation/diarrhea) 2) Miralax 17gm mixed in 8oz. water or juice-daily. May use BID as needed.  3) Gas-x,Phazyme, etc. as needed for gas & bloating.  4) Soft,bland diet. No spicy,greasy,fried foods.  5) Prilosec over-the-counter as needed  6) May hold gluten/wheat products from diet to see if symptoms improve.  7)  May try probiotics (Align, Activa, etc) to help calm the bowels down 7) If symptoms become worse call back immediately.

## 2015-12-16 ENCOUNTER — Inpatient Hospital Stay (HOSPITAL_COMMUNITY)
Admission: EM | Admit: 2015-12-16 | Discharge: 2015-12-21 | DRG: 920 | Disposition: A | Discharge status: Home / Self Care | Payer: BLUE CROSS/BLUE SHIELD | Attending: Surgery | Admitting: Surgery

## 2015-12-16 DIAGNOSIS — H9193 Unspecified hearing loss, bilateral: Secondary | ICD-10-CM | POA: Diagnosis present

## 2015-12-16 DIAGNOSIS — K625 Hemorrhage of anus and rectum: Secondary | ICD-10-CM | POA: Diagnosis not present

## 2015-12-16 DIAGNOSIS — F329 Major depressive disorder, single episode, unspecified: Secondary | ICD-10-CM | POA: Diagnosis present

## 2015-12-16 DIAGNOSIS — K219 Gastro-esophageal reflux disease without esophagitis: Secondary | ICD-10-CM | POA: Diagnosis present

## 2015-12-16 DIAGNOSIS — H9190 Unspecified hearing loss, unspecified ear: Secondary | ICD-10-CM

## 2015-12-16 DIAGNOSIS — I959 Hypotension, unspecified: Secondary | ICD-10-CM | POA: Diagnosis present

## 2015-12-16 DIAGNOSIS — D62 Acute posthemorrhagic anemia: Secondary | ICD-10-CM | POA: Diagnosis present

## 2015-12-16 DIAGNOSIS — K91840 Postprocedural hemorrhage and hematoma of a digestive system organ or structure following a digestive system procedure: Secondary | ICD-10-CM | POA: Diagnosis not present

## 2015-12-16 DIAGNOSIS — Y838 Other surgical procedures as the cause of abnormal reaction of the patient, or of later complication, without mention of misadventure at the time of the procedure: Secondary | ICD-10-CM | POA: Diagnosis present

## 2015-12-16 DIAGNOSIS — K643 Fourth degree hemorrhoids: Secondary | ICD-10-CM | POA: Diagnosis present

## 2015-12-16 DIAGNOSIS — D649 Anemia, unspecified: Secondary | ICD-10-CM

## 2015-12-16 DIAGNOSIS — Z9889 Other specified postprocedural states: Secondary | ICD-10-CM

## 2015-12-16 DIAGNOSIS — R55 Syncope and collapse: Secondary | ICD-10-CM

## 2015-12-16 DIAGNOSIS — R338 Other retention of urine: Secondary | ICD-10-CM

## 2015-12-16 DIAGNOSIS — F419 Anxiety disorder, unspecified: Secondary | ICD-10-CM | POA: Diagnosis present

## 2015-12-16 DIAGNOSIS — Z8719 Personal history of other diseases of the digestive system: Secondary | ICD-10-CM

## 2015-12-16 DIAGNOSIS — Z87891 Personal history of nicotine dependence: Secondary | ICD-10-CM

## 2015-12-16 LAB — COMPREHENSIVE METABOLIC PANEL
ALK PHOS: 42 U/L (ref 38–126)
ALT: 18 U/L (ref 17–63)
AST: 20 U/L (ref 15–41)
Albumin: 3.4 g/dL — ABNORMAL LOW (ref 3.5–5.0)
Anion gap: 9 (ref 5–15)
BUN: 11 mg/dL (ref 6–20)
CALCIUM: 8.2 mg/dL — AB (ref 8.9–10.3)
CO2: 22 mmol/L (ref 22–32)
CREATININE: 0.89 mg/dL (ref 0.61–1.24)
Chloride: 104 mmol/L (ref 101–111)
GFR calc non Af Amer: >60 mL/min (ref >60)
GLUCOSE: 115 mg/dL — AB (ref 65–99)
Potassium: 3.5 mmol/L (ref 3.5–5.1)
SODIUM: 135 mmol/L (ref 135–145)
Total Bilirubin: 0.4 mg/dL (ref 0.3–1.2)
Total Protein: 6 g/dL — ABNORMAL LOW (ref 6.5–8.1)

## 2015-12-16 LAB — I-STAT CHEM 8, ED
BUN: 9 mg/dL (ref 6–20)
CHLORIDE: 101 mmol/L (ref 101–111)
Calcium, Ion: 1.03 mmol/L — ABNORMAL LOW (ref 1.12–1.23)
Creatinine, Ser: 1 mg/dL (ref 0.61–1.24)
Glucose, Bld: 107 mg/dL — ABNORMAL HIGH (ref 65–99)
HEMATOCRIT: 25 % — AB (ref 39.0–52.0)
Hemoglobin: 8.5 g/dL — ABNORMAL LOW (ref 13.0–17.0)
POTASSIUM: 3.4 mmol/L — AB (ref 3.5–5.1)
SODIUM: 138 mmol/L (ref 135–145)
TCO2: 21 mmol/L (ref 0–100)

## 2015-12-16 LAB — CBC
HCT: 26.7 % — ABNORMAL LOW (ref 39.0–52.0)
Hemoglobin: 9.3 g/dL — ABNORMAL LOW (ref 13.0–17.0)
MCH: 29.2 pg (ref 26.0–34.0)
MCHC: 34.8 g/dL (ref 30.0–36.0)
MCV: 83.7 fL (ref 78.0–100.0)
PLATELETS: 243 10*3/uL (ref 150–400)
RBC: 3.19 MIL/uL — AB (ref 4.22–5.81)
RDW: 12.8 % (ref 11.5–15.5)
WBC: 16 10*3/uL — ABNORMAL HIGH (ref 4.0–10.5)

## 2015-12-16 MED ORDER — DIPHENHYDRAMINE HCL 50 MG/ML IJ SOLN: 25.0000 mg | Freq: Four times a day (QID) | INTRAMUSCULAR | Status: DC | PRN

## 2015-12-16 MED ORDER — DIPHENHYDRAMINE HCL 25 MG PO CAPS: 25.0000 mg | ORAL_CAPSULE | Freq: Four times a day (QID) | ORAL | Status: DC | PRN

## 2015-12-16 MED ORDER — SODIUM CHLORIDE 0.9 % IV SOLN: Freq: Once | INTRAVENOUS | Status: DC

## 2015-12-16 MED ORDER — SODIUM CHLORIDE 0.9 % IV SOLN
INTRAVENOUS | Status: DC
  Administered 2015-12-16 – 2015-12-18 (×2): via INTRAVENOUS

## 2015-12-16 MED ORDER — ONDANSETRON 4 MG PO TBDP
4.0000 mg | ORAL_TABLET | Freq: Four times a day (QID) | ORAL | Status: DC | PRN
  Filled 2015-12-16: qty 1

## 2015-12-16 MED ORDER — NICOTINE 21 MG/24HR TD PT24
21.0000 mg | MEDICATED_PATCH | Freq: Every day | TRANSDERMAL | Status: DC
  Administered 2015-12-17 – 2015-12-21 (×5): 21 mg via TRANSDERMAL
  Filled 2015-12-16 (×9): qty 1

## 2015-12-16 MED ORDER — ONDANSETRON HCL 4 MG/2ML IJ SOLN: 4.0000 mg | Freq: Four times a day (QID) | INTRAMUSCULAR | Status: DC | PRN

## 2015-12-16 MED ORDER — SIMVASTATIN 20 MG PO TABS
20.0000 mg | ORAL_TABLET | Freq: Every day | ORAL | Status: DC
  Administered 2015-12-16 – 2015-12-20 (×5): 20 mg via ORAL
  Filled 2015-12-16 (×3): qty 1
  Filled 2015-12-16: qty 2
  Filled 2015-12-16 (×3): qty 1

## 2015-12-16 MED ORDER — SODIUM CHLORIDE 0.9 % IV BOLUS (SEPSIS)
1000.0000 mL | Freq: Once | INTRAVENOUS | Status: AC
  Administered 2015-12-16: 1000 mL via INTRAVENOUS

## 2015-12-16 MED ORDER — DOCUSATE SODIUM 100 MG PO CAPS
100.0000 mg | ORAL_CAPSULE | Freq: Two times a day (BID) | ORAL | Status: DC
Start: 2015-12-16 — End: 2015-12-17
  Administered 2015-12-16: 100 mg via ORAL
  Filled 2015-12-16 (×3): qty 1

## 2015-12-16 MED ORDER — MORPHINE SULFATE (PF) 2 MG/ML IV SOLN
2.0000 mg | INTRAVENOUS | Status: DC | PRN
  Administered 2015-12-17 – 2015-12-20 (×16): 2 mg via INTRAVENOUS
  Filled 2015-12-16 (×16): qty 1

## 2015-12-16 MED ORDER — METHOCARBAMOL 500 MG PO TABS: 500.0000 mg | ORAL_TABLET | Freq: Four times a day (QID) | ORAL | Status: DC | PRN

## 2015-12-16 MED ORDER — PANTOPRAZOLE SODIUM 40 MG PO TBEC
40.0000 mg | DELAYED_RELEASE_TABLET | Freq: Every day | ORAL | Status: DC
  Administered 2015-12-18 – 2015-12-21 (×4): 40 mg via ORAL
  Filled 2015-12-16 (×5): qty 1

## 2015-12-16 NOTE — ED Notes (Signed)
When patient arrived, he had a large amount of bright red blood from rectum. There were numerous blood clots.

## 2015-12-16 NOTE — ED Notes (Signed)
While attempting to start IV fluids, pt became nausea, pale, weak, and feeling like he was going to have a syncopal episode. Hypotension of 86/65, placed in trendelenburg position, placed wet wash clothes over forehead and neck. Within 3-4 minutes, pt feels a little better.

## 2015-12-16 NOTE — ED Notes (Signed)
Writer clicked off POC occult blood by mistake

## 2015-12-16 NOTE — ED Notes (Signed)
Per EMS, patient has been hypotensive-states 250 cc NS fluid bolus

## 2015-12-16 NOTE — H&P (Signed)
Tommy Page is an 55 y.o. male.   Chief Complaint: Bleeding after hemorrhoidectomy HPI: This is a patient of Dr. Johney Maine who underwent THD/ external hemorrhoidectomy x 3 on 12/03/15.  He has had three ED visits for bleeding and syncopal episodes, but was never admitted.  His family called tonight because he is passing BRBPR and blood clots per rectum and "passed out" at home.  He was evaluated by the ED and they have determined that his vitals are stable, but since he continues to have bleeding and syncopal episodes, we will admit him for Dr. Johney Maine.  Past Medical History  Diagnosis Date  . Anxiety   . Depression   . GERD (gastroesophageal reflux disease)   . HOH (hard of hearing)     BOTH EARS  . Hemorrhoids     Past Surgical History  Procedure Laterality Date  . Basal cell carcinoma removed  2013    LEFT TEMPLE  . Nasal septum repair  2005  . Lasix eye surgery  2005  . Transanal hemorrhoidal dearterialization N/A 12/03/2015    Procedure: TRANSANAL HEMORRHOIDAL DEARTERIALIZATION LIGATION/PEXY  EUA HEMORRHOIDECTOMY ;  Surgeon: Michael Boston, MD;  Location: [facility];  Service: General;  Laterality: N/A;    No family history on file. Social History:  reports that he quit smoking about 5 months ago. He has never used smokeless tobacco. He reports that he drinks alcohol. He reports that he does not use illicit drugs.  Allergies:  Allergies  Allergen Reactions  . Zoloft [Sertraline Hcl] Nausea And Vomiting    Agitation    Prior to Admission medications   Medication Sig Start Date End Date Taking? Authorizing Provider  docusate sodium (COLACE) 100 MG capsule Take 100 mg by mouth daily as needed for mild constipation.    Historical Provider, MD  hydrocortisone (ANUSOL-HC) 2.5 % rectal cream Place 1 application rectally 2 (two) times daily. Apply around anus for irritated & painful hemorrhoids 12/03/15   Michael Boston, MD  naproxen sodium (ALEVE) 220 MG tablet Take 440 mg by mouth every 8  (eight) hours as needed (For pain.).    Historical Provider, MD  nicotine (NICODERM CQ - DOSED IN MG/24 HOURS) 21 mg/24hr patch Place 21 mg onto the skin daily.    Historical Provider, MD  nicotine polacrilex (NICORETTE) 2 MG gum Take 2 mg by mouth as needed for smoking cessation.    Historical Provider, MD  omeprazole (PRILOSEC OTC) 20 MG tablet Take 20 mg by mouth daily.    Historical Provider, MD  oxyCODONE (OXY IR/ROXICODONE) 5 MG immediate release tablet Take 1-2 tablets (5-10 mg total) by mouth every 4 (four) hours as needed for moderate pain, severe pain or breakthrough pain. 12/03/15   Michael Boston, MD  simvastatin (ZOCOR) 20 MG tablet Take 20 mg by mouth at bedtime.     Historical Provider, MD  Wheat Dextrin (BENEFIBER) POWD Take 10 mLs by mouth 2 (two) times daily.    Historical Provider, MD     Results for orders placed or performed during the hospital encounter of 12/16/15 (from the past 48 hour(s))  Comprehensive metabolic panel     Status: Abnormal   Collection Time: 12/16/15  9:02 PM  Result Value Ref Range   Sodium 135 135 - 145 mmol/L   Potassium 3.5 3.5 - 5.1 mmol/L   Chloride 104 101 - 111 mmol/L   CO2 22 22 - 32 mmol/L   Glucose, Bld 115 (H) 65 - 99 mg/dL   BUN  11 6 - 20 mg/dL   Creatinine, Ser 0.89 0.61 - 1.24 mg/dL   Calcium 8.2 (L) 8.9 - 10.3 mg/dL   Total Protein 6.0 (L) 6.5 - 8.1 g/dL   Albumin 3.4 (L) 3.5 - 5.0 g/dL   AST 20 15 - 41 U/L   ALT 18 17 - 63 U/L   Alkaline Phosphatase 42 38 - 126 U/L   Total Bilirubin 0.4 0.3 - 1.2 mg/dL   GFR calc non Af Amer >60 >60 mL/min   GFR calc Af Amer >60 >60 mL/min    Comment: (NOTE) The eGFR has been calculated using the CKD EPI equation. This calculation has not been validated in all clinical situations. eGFR's persistently <60 mL/min signify possible Chronic Kidney Disease.    Anion gap 9 5 - 15  CBC     Status: Abnormal   Collection Time: 12/16/15  9:02 PM  Result Value Ref Range   WBC 16.0 (H) 4.0 - 10.5 K/uL    RBC 3.19 (L) 4.22 - 5.81 MIL/uL   Hemoglobin 9.3 (L) 13.0 - 17.0 g/dL   HCT 26.7 (L) 39.0 - 52.0 %   MCV 83.7 78.0 - 100.0 fL   MCH 29.2 26.0 - 34.0 pg   MCHC 34.8 30.0 - 36.0 g/dL   RDW 12.8 11.5 - 15.5 %   Platelets 243 150 - 400 K/uL  Type and screen [place]     Status: None (Preliminary result)   Collection Time: 12/16/15  9:02 PM  Result Value Ref Range   ABO/RH(D) O POS    Antibody Screen PENDING    Sample Expiration 12/19/2015   I-Stat Chem 8, ED     Status: Abnormal   Collection Time: 12/16/15  9:10 PM  Result Value Ref Range   Sodium 138 135 - 145 mmol/L   Potassium 3.4 (L) 3.5 - 5.1 mmol/L   Chloride 101 101 - 111 mmol/L   BUN 9 6 - 20 mg/dL   Creatinine, Ser 1.00 0.61 - 1.24 mg/dL   Glucose, Bld 107 (H) 65 - 99 mg/dL   Calcium, Ion 1.03 (L) 1.12 - 1.23 mmol/L   TCO2 21 0 - 100 mmol/L   Hemoglobin 8.5 (L) 13.0 - 17.0 g/dL   HCT 25.0 (L) 39.0 - 52.0 %   No results found.  ROS Constitutional: Positive for fatigue. Negative for fever and chills.  Respiratory: Negative for shortness of breath.  Cardiovascular: Negative for chest pain, palpitations and leg swelling.  Gastrointestinal: Positive for blood in stool and anal bleeding. Negative for nausea, vomiting, abdominal pain, diarrhea, constipation and abdominal distention.  Genitourinary: Negative for dysuria and difficulty urinating.  Musculoskeletal: Negative for back pain, neck pain and neck stiffness.  Skin: Negative for rash and wound.  Neurological: Positive for dizziness, syncope and light-headedness. Negative for weakness and numbness.  All other systems reviewed and are negative.  Blood pressure 106/75, pulse 75, temperature 98 F (36.7 C), temperature source Oral, resp. rate 18, SpO2 100 %. Physical Exam  Per EDP note  Assessment/Plan Bleeding per rectum after THD/ external hemorrhoidectomy on 12/03/15.   Syncopal episodes/ orthostasis Acute blood loss anemia   Admit to Dr.  Johney Maine NPO for possible EUA tomorrow Type and screen Recheck labs in AM - may need transfusion Will keep at bedrest for now PRN pain meds  Khylan Sawyer K., MD 12/16/2015, 9:38 PM

## 2015-12-16 NOTE — ED Notes (Signed)
Notified Dr. Ranae PalmsYelverton of patients near syncopal episode.

## 2015-12-16 NOTE — ED Provider Notes (Signed)
CSN: [id number]     Arrival date & time 12/16/15  2031 History   First MD Initiated Contact with Patient 12/16/15 2106     Chief Complaint  Patient presents with  . GI Bleeding     (Consider location/radiation/quality/duration/timing/severity/associated sxs/prior Treatment) HPI  Patient presents with multiple episodes of passing bright blood with large clots per rectum. This is worsened since 5 PM. States he's had 5-6 episodes of same. Associated with lightheadedness especially with sitting or standing. Had one syncopal episode lasting roughly 1 minute. No trauma per family. Patient had hemorrhoidectomy done by Dr. Michaell CowingGross on 5/10. He's been seen in the emergency department for persistent bleeding, syncope and urinary retention. Denies current abdominal pain.   Past Medical History  Diagnosis Date  . Anxiety   . Depression   . GERD (gastroesophageal reflux disease)   . HOH (hard of hearing)     BOTH EARS  . Hemorrhoids    Past Surgical History  Procedure Laterality Date  . Basal cell carcinoma removed  2013    LEFT TEMPLE  . Nasal septum repair  2005  . Lasix eye surgery  2005  . Transanal hemorrhoidal dearterialization N/A 12/03/2015    Procedure: TRANSANAL HEMORRHOIDAL DEARTERIALIZATION LIGATION/PEXY  EUA HEMORRHOIDECTOMY ;  Surgeon: Karie SodaSteven Gross, MD;  Location: [facility];  Service: General;  Laterality: N/A;   No family history on file. Social History  Substance Use Topics  . Smoking status: Former Smoker    Quit date: 06/26/2015  . Smokeless tobacco: Never Used  . Alcohol Use: Yes     Comment: OCCASIONAL    Review of Systems  Constitutional: Positive for fatigue. Negative for fever and chills.  Respiratory: Negative for shortness of breath.   Cardiovascular: Negative for chest pain, palpitations and leg swelling.  Gastrointestinal: Positive for blood in stool and anal bleeding. Negative for nausea, vomiting, abdominal pain, diarrhea, constipation and abdominal distention.   Genitourinary: Negative for dysuria and difficulty urinating.  Musculoskeletal: Negative for back pain, neck pain and neck stiffness.  Skin: Negative for rash and wound.  Neurological: Positive for dizziness, syncope and light-headedness. Negative for weakness and numbness.  All other systems reviewed and are negative.     Allergies  Zoloft  Home Medications   Prior to Admission medications   Medication Sig Start Date End Date Taking? Authorizing Provider  docusate sodium (COLACE) 100 MG capsule Take 100 mg by mouth daily as needed for mild constipation.    Historical Provider, MD  hydrocortisone (ANUSOL-HC) 2.5 % rectal cream Place 1 application rectally 2 (two) times daily. Apply around anus for irritated & painful hemorrhoids 12/03/15   Karie SodaSteven Gross, MD  naproxen sodium (ALEVE) 220 MG tablet Take 440 mg by mouth every 8 (eight) hours as needed (For pain.).    Historical Provider, MD  nicotine (NICODERM CQ - DOSED IN MG/24 HOURS) 21 mg/24hr patch Place 21 mg onto the skin daily.    Historical Provider, MD  nicotine polacrilex (NICORETTE) 2 MG gum Take 2 mg by mouth as needed for smoking cessation.    Historical Provider, MD  omeprazole (PRILOSEC OTC) 20 MG tablet Take 20 mg by mouth daily.    Historical Provider, MD  oxyCODONE (OXY IR/ROXICODONE) 5 MG immediate release tablet Take 1-2 tablets (5-10 mg total) by mouth every 4 (four) hours as needed for moderate pain, severe pain or breakthrough pain. 12/03/15   Karie SodaSteven Gross, MD  simvastatin (ZOCOR) 20 MG tablet Take 20 mg by mouth at bedtime.  Historical Provider, MD  Wheat Dextrin (BENEFIBER) POWD Take 10 mLs by mouth 2 (two) times daily.    Historical Provider, MD   BP 105/74 mmHg  Pulse 66  Temp(Src) 98 F (36.7 C) (Oral)  Resp 13  SpO2 100% Physical Exam  Constitutional: He is oriented to person, place, and time. He appears well-developed and well-nourished. No distress.  HENT:  Head: Normocephalic and atraumatic.   Mouth/Throat: Oropharynx is clear and moist. No oropharyngeal exudate.  Pale mucosa  Eyes: EOM are normal. Pupils are equal, round, and reactive to light.  Neck: Normal range of motion. Neck supple.  Cardiovascular: Normal rate and regular rhythm.  Exam reveals no gallop and no friction rub.   No murmur heard. Pulmonary/Chest: Effort normal and breath sounds normal. No respiratory distress. He has no wheezes. He has no rales. He exhibits no tenderness.  Abdominal: Soft. Bowel sounds are normal. He exhibits no distension and no mass. There is no tenderness. There is no rebound and no guarding.  Genitourinary:  Patient actively passing bright red blood with several large clots. He has tenderness on rectal exam.  Musculoskeletal: Normal range of motion. He exhibits no edema or tenderness.  Neurological: He is alert and oriented to person, place, and time.  Moves all extremities without deficit. Sensation is intact.  Skin: Skin is warm and dry. No rash noted. No erythema.  Psychiatric: He has a normal mood and affect. His behavior is normal.  Nursing note and vitals reviewed.   ED Course  Procedures (including critical care time) Labs Review Labs Reviewed  COMPREHENSIVE METABOLIC PANEL - Abnormal; Notable for the following:    Glucose, Bld 115 (*)    Calcium 8.2 (*)    Total Protein 6.0 (*)    Albumin 3.4 (*)    All other components within normal limits  CBC - Abnormal; Notable for the following:    WBC 16.0 (*)    RBC 3.19 (*)    Hemoglobin 9.3 (*)    HCT 26.7 (*)    All other components within normal limits  I-STAT CHEM 8, ED - Abnormal; Notable for the following:    Potassium 3.4 (*)    Glucose, Bld 107 (*)    Calcium, Ion 1.03 (*)    Hemoglobin 8.5 (*)    HCT 25.0 (*)    All other components within normal limits  POC OCCULT BLOOD, ED  TYPE AND SCREEN    Imaging Review No results found. I have personally reviewed and evaluated these images and lab results as part of my  medical decision-making.   EKG Interpretation None      MDM   Final diagnoses:  Rectal bleeding  Syncope and collapse   Discussed with Dr. Corliss Skains. He will admit and likely take to OR tomorrow.  Type and screen performed. We'll give IV fluids.  Patient is to have brief near-syncopal episode while in the emergency department. Heart rate dropped into the 60s and systolic blood pressure into the 80s. Consistent with vasovagal syndrome. Heart rate and blood pressure improved.   Loren Racer, MD 12/16/15 2214

## 2015-12-16 NOTE — ED Notes (Signed)
Patient arrives by EMS with complaints of bright red rectal bleeding.  Patient stated to EMS he had a syncopal episode today.  Patient had a hemorrhoidectomy 2 weeks ago.

## 2015-12-16 NOTE — ED Notes (Signed)
Bed: [id number] Expected date:  Expected time:  Means of arrival:  Comments: EMS 55 yo male/GI bleed

## 2015-12-17 ENCOUNTER — Encounter (HOSPITAL_COMMUNITY): Payer: Self-pay | Admitting: Certified Registered"

## 2015-12-17 ENCOUNTER — Observation Stay (HOSPITAL_COMMUNITY): Payer: BLUE CROSS/BLUE SHIELD | Admitting: Certified Registered"

## 2015-12-17 ENCOUNTER — Encounter (HOSPITAL_COMMUNITY)
Admission: EM | Disposition: A | Discharge status: Home / Self Care | Payer: Self-pay | Source: Home / Self Care | Attending: Surgery

## 2015-12-17 DIAGNOSIS — Z9889 Other specified postprocedural states: Secondary | ICD-10-CM

## 2015-12-17 DIAGNOSIS — K91840 Postprocedural hemorrhage and hematoma of a digestive system organ or structure following a digestive system procedure: Secondary | ICD-10-CM | POA: Diagnosis present

## 2015-12-17 DIAGNOSIS — I959 Hypotension, unspecified: Secondary | ICD-10-CM | POA: Diagnosis present

## 2015-12-17 DIAGNOSIS — K625 Hemorrhage of anus and rectum: Secondary | ICD-10-CM | POA: Diagnosis present

## 2015-12-17 DIAGNOSIS — F329 Major depressive disorder, single episode, unspecified: Secondary | ICD-10-CM | POA: Diagnosis present

## 2015-12-17 DIAGNOSIS — Y838 Other surgical procedures as the cause of abnormal reaction of the patient, or of later complication, without mention of misadventure at the time of the procedure: Secondary | ICD-10-CM | POA: Diagnosis present

## 2015-12-17 DIAGNOSIS — F419 Anxiety disorder, unspecified: Secondary | ICD-10-CM | POA: Diagnosis present

## 2015-12-17 DIAGNOSIS — H9193 Unspecified hearing loss, bilateral: Secondary | ICD-10-CM | POA: Diagnosis present

## 2015-12-17 DIAGNOSIS — Z8719 Personal history of other diseases of the digestive system: Secondary | ICD-10-CM

## 2015-12-17 DIAGNOSIS — D649 Anemia, unspecified: Secondary | ICD-10-CM

## 2015-12-17 DIAGNOSIS — D62 Acute posthemorrhagic anemia: Secondary | ICD-10-CM | POA: Diagnosis present

## 2015-12-17 DIAGNOSIS — Z87891 Personal history of nicotine dependence: Secondary | ICD-10-CM | POA: Diagnosis not present

## 2015-12-17 DIAGNOSIS — K219 Gastro-esophageal reflux disease without esophagitis: Secondary | ICD-10-CM | POA: Diagnosis present

## 2015-12-17 LAB — PROTIME-INR
INR: 1.48 (ref 0.00–1.49)
Prothrombin Time: 18 seconds — ABNORMAL HIGH (ref 11.6–15.2)

## 2015-12-17 LAB — BASIC METABOLIC PANEL
Anion gap: 4 — ABNORMAL LOW (ref 5–15)
BUN: 9 mg/dL (ref 6–20)
CO2: 23 mmol/L (ref 22–32)
CREATININE: 0.83 mg/dL (ref 0.61–1.24)
Calcium: 7.7 mg/dL — ABNORMAL LOW (ref 8.9–10.3)
Chloride: 111 mmol/L (ref 101–111)
GFR calc Af Amer: >60 mL/min (ref >60)
GLUCOSE: 100 mg/dL — AB (ref 65–99)
Potassium: 3.7 mmol/L (ref 3.5–5.1)
SODIUM: 138 mmol/L (ref 135–145)

## 2015-12-17 LAB — CBC
HCT: 18.5 % — ABNORMAL LOW (ref 39.0–52.0)
Hemoglobin: 6.5 g/dL — CL (ref 13.0–17.0)
MCH: 29.1 pg (ref 26.0–34.0)
MCHC: 35.1 g/dL (ref 30.0–36.0)
MCV: 83 fL (ref 78.0–100.0)
PLATELETS: 226 10*3/uL (ref 150–400)
RBC: 2.23 MIL/uL — ABNORMAL LOW (ref 4.22–5.81)
RDW: 13.1 % (ref 11.5–15.5)
WBC: 9.3 10*3/uL (ref 4.0–10.5)

## 2015-12-17 LAB — MRSA PCR SCREENING: MRSA by PCR: NEGATIVE

## 2015-12-17 LAB — PREPARE RBC (CROSSMATCH)

## 2015-12-17 LAB — APTT: APTT: 30 s (ref 24–37)

## 2015-12-17 SURGERY — EXAM UNDER ANESTHESIA
Anesthesia: General | Site: Rectum

## 2015-12-17 MED ORDER — SUGAMMADEX SODIUM 200 MG/2ML IV SOLN
INTRAVENOUS | Status: DC | PRN
  Administered 2015-12-17: 200 mg via INTRAVENOUS

## 2015-12-17 MED ORDER — ALUM & MAG HYDROXIDE-SIMETH 200-200-20 MG/5ML PO SUSP: 30.0000 mL | Freq: Four times a day (QID) | ORAL | Status: DC | PRN

## 2015-12-17 MED ORDER — LACTATED RINGERS IV BOLUS (SEPSIS)
1000.0000 mL | Freq: Three times a day (TID) | INTRAVENOUS | Status: AC | PRN
  Administered 2015-12-19: 1000 mL via INTRAVENOUS
  Filled 2015-12-17: qty 1000

## 2015-12-17 MED ORDER — CEFAZOLIN SODIUM-DEXTROSE 2-4 GM/100ML-% IV SOLN
2.0000 g | INTRAVENOUS | Status: AC
  Administered 2015-12-17: 2 g via INTRAVENOUS
  Filled 2015-12-17: qty 100

## 2015-12-17 MED ORDER — ONDANSETRON HCL 4 MG/2ML IJ SOLN: 4.0000 mg | Freq: Once | INTRAMUSCULAR | Status: DC | PRN

## 2015-12-17 MED ORDER — FENTANYL CITRATE (PF) 100 MCG/2ML IJ SOLN
INTRAMUSCULAR | Status: AC
Start: 2015-12-17 — End: 2015-12-17
  Filled 2015-12-17: qty 2

## 2015-12-17 MED ORDER — FENTANYL CITRATE (PF) 100 MCG/2ML IJ SOLN
INTRAMUSCULAR | Status: DC | PRN
  Administered 2015-12-17: 100 ug via INTRAVENOUS

## 2015-12-17 MED ORDER — LIP MEDEX EX OINT
1.0000 | TOPICAL_OINTMENT | Freq: Two times a day (BID) | CUTANEOUS | Status: DC
Start: 2015-12-17 — End: 2015-12-21
  Administered 2015-12-17 – 2015-12-20 (×8): 1 via TOPICAL
  Filled 2015-12-17 (×2): qty 7

## 2015-12-17 MED ORDER — SODIUM CHLORIDE 0.9% FLUSH
3.0000 mL | Freq: Two times a day (BID) | INTRAVENOUS | Status: DC
  Administered 2015-12-17 – 2015-12-20 (×6): 3 mL via INTRAVENOUS

## 2015-12-17 MED ORDER — MIDAZOLAM HCL 2 MG/2ML IJ SOLN
INTRAMUSCULAR | Status: AC
  Filled 2015-12-17: qty 2

## 2015-12-17 MED ORDER — PROPOFOL 10 MG/ML IV BOLUS
INTRAVENOUS | Status: AC
Start: 2015-12-17 — End: 2015-12-17
  Filled 2015-12-17: qty 20

## 2015-12-17 MED ORDER — NICOTINE POLACRILEX 2 MG MT GUM
2.0000 mg | CHEWING_GUM | OROMUCOSAL | Status: DC | PRN
  Filled 2015-12-17: qty 1

## 2015-12-17 MED ORDER — ROCURONIUM BROMIDE 100 MG/10ML IV SOLN
INTRAVENOUS | Status: DC | PRN
  Administered 2015-12-17 (×2): 20 mg via INTRAVENOUS

## 2015-12-17 MED ORDER — SUGAMMADEX SODIUM 200 MG/2ML IV SOLN
INTRAVENOUS | Status: AC
  Filled 2015-12-17: qty 2

## 2015-12-17 MED ORDER — SODIUM CHLORIDE 0.9 % IV BOLUS (SEPSIS)
1000.0000 mL | Freq: Once | INTRAVENOUS | Status: AC
  Administered 2015-12-17: 1000 mL via INTRAVENOUS

## 2015-12-17 MED ORDER — HYDROMORPHONE HCL 2 MG/ML IJ SOLN
INTRAMUSCULAR | Status: AC
  Filled 2015-12-17: qty 1

## 2015-12-17 MED ORDER — SUCCINYLCHOLINE CHLORIDE 20 MG/ML IJ SOLN
INTRAMUSCULAR | Status: DC | PRN
  Administered 2015-12-17: 100 mg via INTRAVENOUS

## 2015-12-17 MED ORDER — SODIUM CHLORIDE 0.9 % IJ SOLN
INTRAMUSCULAR | Status: AC
  Filled 2015-12-17: qty 10

## 2015-12-17 MED ORDER — ZINC OXIDE 40 % EX OINT
TOPICAL_OINTMENT | CUTANEOUS | Status: DC | PRN
  Filled 2015-12-17: qty 114

## 2015-12-17 MED ORDER — FERROUS SULFATE 325 (65 FE) MG PO TABS
325.0000 mg | ORAL_TABLET | Freq: Two times a day (BID) | ORAL | Status: DC
  Administered 2015-12-17 – 2015-12-21 (×8): 325 mg via ORAL
  Filled 2015-12-17 (×14): qty 1

## 2015-12-17 MED ORDER — SODIUM CHLORIDE 0.9 % IV SOLN: 250.0000 mL | INTRAVENOUS | Status: DC | PRN

## 2015-12-17 MED ORDER — PHENYLEPHRINE 40 MCG/ML (10ML) SYRINGE FOR IV PUSH (FOR BLOOD PRESSURE SUPPORT)
PREFILLED_SYRINGE | INTRAVENOUS | Status: AC
  Filled 2015-12-17: qty 10

## 2015-12-17 MED ORDER — LACTATED RINGERS IV BOLUS (SEPSIS)
1000.0000 mL | Freq: Once | INTRAVENOUS | Status: AC
  Administered 2015-12-17 (×2): 1000 mL via INTRAVENOUS

## 2015-12-17 MED ORDER — BUPIVACAINE-EPINEPHRINE (PF) 0.25% -1:200000 IJ SOLN
INTRAMUSCULAR | Status: DC | PRN
  Administered 2015-12-17: 30 mL

## 2015-12-17 MED ORDER — MAGIC MOUTHWASH
15.0000 mL | Freq: Four times a day (QID) | ORAL | Status: DC | PRN
  Filled 2015-12-17: qty 15

## 2015-12-17 MED ORDER — ACETAMINOPHEN 500 MG PO TABS
1000.0000 mg | ORAL_TABLET | Freq: Three times a day (TID) | ORAL | Status: DC
  Administered 2015-12-17 – 2015-12-20 (×8): 1000 mg via ORAL
  Filled 2015-12-17 (×10): qty 2

## 2015-12-17 MED ORDER — PHENOL 1.4 % MT LIQD
2.0000 | OROMUCOSAL | Status: DC | PRN
  Filled 2015-12-17: qty 177

## 2015-12-17 MED ORDER — BENEFIBER PO POWD: 10.0000 mL | Freq: Two times a day (BID) | ORAL | Status: DC

## 2015-12-17 MED ORDER — HYDROCORTISONE 2.5 % RE CREA
1.0000 "application " | TOPICAL_CREAM | Freq: Two times a day (BID) | RECTAL | Status: DC
  Administered 2015-12-18 (×2): 1 via RECTAL
  Filled 2015-12-17 (×2): qty 28.35

## 2015-12-17 MED ORDER — PSYLLIUM 95 % PO PACK
1.0000 | PACK | Freq: Two times a day (BID) | ORAL | Status: DC
  Administered 2015-12-17 – 2015-12-20 (×6): 1 via ORAL
  Filled 2015-12-17 (×9): qty 1

## 2015-12-17 MED ORDER — MIDAZOLAM HCL 5 MG/5ML IJ SOLN
INTRAMUSCULAR | Status: DC | PRN
  Administered 2015-12-17: 2 mg via INTRAVENOUS

## 2015-12-17 MED ORDER — VITAMIN C 500 MG PO TABS
500.0000 mg | ORAL_TABLET | Freq: Two times a day (BID) | ORAL | Status: DC
Start: 2015-12-17 — End: 2015-12-21
  Administered 2015-12-17 – 2015-12-21 (×9): 500 mg via ORAL
  Filled 2015-12-17 (×10): qty 1

## 2015-12-17 MED ORDER — ETOMIDATE 2 MG/ML IV SOLN
INTRAVENOUS | Status: AC
Start: 2015-12-17 — End: 2015-12-17
  Filled 2015-12-17: qty 10

## 2015-12-17 MED ORDER — OXYCODONE HCL 5 MG PO TABS
5.0000 mg | ORAL_TABLET | ORAL | Status: DC | PRN
  Filled 2015-12-17: qty 2

## 2015-12-17 MED ORDER — LACTATED RINGERS IV SOLN
INTRAVENOUS | Status: DC | PRN
  Administered 2015-12-17 (×2): via INTRAVENOUS

## 2015-12-17 MED ORDER — SODIUM CHLORIDE 0.9 % IV SOLN
INTRAVENOUS | Status: DC | PRN
  Administered 2015-12-17 (×2): via INTRAVENOUS

## 2015-12-17 MED ORDER — CEFAZOLIN SODIUM-DEXTROSE 2-4 GM/100ML-% IV SOLN
INTRAVENOUS | Status: AC
  Filled 2015-12-17: qty 100

## 2015-12-17 MED ORDER — SODIUM CHLORIDE 0.9% FLUSH: 3.0000 mL | INTRAVENOUS | Status: DC | PRN

## 2015-12-17 MED ORDER — LORAZEPAM 2 MG/ML IJ SOLN
0.5000 mg | Freq: Three times a day (TID) | INTRAMUSCULAR | Status: DC | PRN
Start: 2015-12-17 — End: 2015-12-21

## 2015-12-17 MED ORDER — LIDOCAINE HCL (CARDIAC) 20 MG/ML IV SOLN
INTRAVENOUS | Status: DC | PRN
  Administered 2015-12-17: 50 mg via INTRAVENOUS

## 2015-12-17 MED ORDER — SODIUM CHLORIDE 0.9% FLUSH: 3.0000 mL | Freq: Two times a day (BID) | INTRAVENOUS | Status: DC

## 2015-12-17 MED ORDER — FENTANYL CITRATE (PF) 100 MCG/2ML IJ SOLN
25.0000 ug | INTRAMUSCULAR | Status: DC | PRN
Start: 2015-12-17 — End: 2015-12-17

## 2015-12-17 MED ORDER — LIDOCAINE HCL (CARDIAC) 20 MG/ML IV SOLN
INTRAVENOUS | Status: AC
  Filled 2015-12-17: qty 5

## 2015-12-17 MED ORDER — MENTHOL 3 MG MT LOZG
1.0000 | LOZENGE | OROMUCOSAL | Status: DC | PRN
Start: 2015-12-17 — End: 2015-12-21

## 2015-12-17 MED ORDER — BUPIVACAINE-EPINEPHRINE (PF) 0.25% -1:200000 IJ SOLN
INTRAMUSCULAR | Status: AC
  Filled 2015-12-17: qty 30

## 2015-12-17 MED ORDER — GUAIFENESIN-DM 100-10 MG/5ML PO SYRP: 15.0000 mL | ORAL_SOLUTION | ORAL | Status: DC | PRN

## 2015-12-17 MED ORDER — PHENYLEPHRINE HCL 10 MG/ML IJ SOLN
INTRAMUSCULAR | Status: DC | PRN
  Administered 2015-12-17: 80 ug via INTRAVENOUS

## 2015-12-17 MED ORDER — ETOMIDATE 2 MG/ML IV SOLN
INTRAVENOUS | Status: DC | PRN
  Administered 2015-12-17: 16 mg via INTRAVENOUS

## 2015-12-17 MED ORDER — OXYCODONE HCL 5 MG PO TABS: 5.0000 mg | ORAL_TABLET | Freq: Once | ORAL | Status: DC | PRN

## 2015-12-17 MED ORDER — SODIUM CHLORIDE 0.9 % IV SOLN: Freq: Once | INTRAVENOUS | Status: DC

## 2015-12-17 MED ORDER — ONDANSETRON HCL 4 MG/2ML IJ SOLN
INTRAMUSCULAR | Status: AC
  Filled 2015-12-17: qty 2

## 2015-12-17 MED ORDER — 0.9 % SODIUM CHLORIDE (POUR BTL) OPTIME
TOPICAL | Status: DC | PRN
  Administered 2015-12-17: 1000 mL

## 2015-12-17 MED ORDER — SODIUM CHLORIDE 0.9 % IV SOLN
25.0000 mg | Freq: Four times a day (QID) | INTRAVENOUS | Status: DC | PRN
  Filled 2015-12-17: qty 1

## 2015-12-17 MED ORDER — METRONIDAZOLE IN NACL 5-0.79 MG/ML-% IV SOLN
500.0000 mg | INTRAVENOUS | Status: AC
  Administered 2015-12-17: 500 mg via INTRAVENOUS

## 2015-12-17 MED ORDER — METRONIDAZOLE IN NACL 5-0.79 MG/ML-% IV SOLN
INTRAVENOUS | Status: AC
  Filled 2015-12-17: qty 100

## 2015-12-17 MED ORDER — HYDROMORPHONE HCL 1 MG/ML IJ SOLN
INTRAMUSCULAR | Status: DC | PRN
  Administered 2015-12-17 (×2): 1 mg via INTRAVENOUS

## 2015-12-17 MED ORDER — OXYCODONE HCL 5 MG/5ML PO SOLN: 5.0000 mg | Freq: Once | ORAL | Status: DC | PRN

## 2015-12-17 SURGICAL SUPPLY — 33 items
BLADE SURG 15 STRL LF DISP TIS (BLADE) ×1 IMPLANT
BLADE SURG 15 STRL SS (BLADE) ×2
COVER SURGICAL LIGHT HANDLE (MISCELLANEOUS) ×2 IMPLANT
DECANTER SPIKE VIAL GLASS SM (MISCELLANEOUS) ×2 IMPLANT
DRAPE LAPAROTOMY T 102X78X121 (DRAPES) ×2 IMPLANT
DRSG PAD ABDOMINAL 8X10 ST (GAUZE/BANDAGES/DRESSINGS) IMPLANT
ELECT PENCIL ROCKER SW 15FT (MISCELLANEOUS) ×2 IMPLANT
ELECT REM PT RETURN 9FT ADLT (ELECTROSURGICAL) ×2
ELECTRODE REM PT RTRN 9FT ADLT (ELECTROSURGICAL) ×1 IMPLANT
GAUZE SPONGE 4X4 12PLY STRL (GAUZE/BANDAGES/DRESSINGS) ×1 IMPLANT
GAUZE SPONGE 4X4 16PLY XRAY LF (GAUZE/BANDAGES/DRESSINGS) ×2 IMPLANT
GLOVE ECLIPSE 8.0 STRL XLNG CF (GLOVE) ×2 IMPLANT
GLOVE INDICATOR 8.0 STRL GRN (GLOVE) ×2 IMPLANT
GOWN STRL REUS W/TWL XL LVL3 (GOWN DISPOSABLE) ×4 IMPLANT
IV CATH 14GX2 1/4 (CATHETERS) IMPLANT
KIT BASIN OR (CUSTOM PROCEDURE TRAY) ×2 IMPLANT
LUBRICANT JELLY K Y 4OZ (MISCELLANEOUS) ×2 IMPLANT
NEEDLE HYPO 22GX1.5 SAFETY (NEEDLE) ×2 IMPLANT
PACK BASIC VI WITH GOWN DISP (CUSTOM PROCEDURE TRAY) ×2 IMPLANT
SPONGE SURGIFOAM ABS GEL 100 (HEMOSTASIS) ×1 IMPLANT
SUT CHROMIC 2 0 SH (SUTURE) IMPLANT
SUT CHROMIC 3 0 SH 27 (SUTURE) IMPLANT
SUT MNCRL AB 4-0 PS2 18 (SUTURE) IMPLANT
SUT PROLENE 2 0 SH DA (SUTURE) IMPLANT
SUT VIC AB 2-0 UR6 27 (SUTURE) ×3 IMPLANT
SUT VIC AB 3-0 SH 18 (SUTURE) IMPLANT
SUT VIC AB 3-0 SH 27 (SUTURE)
SUT VIC AB 3-0 SH 27XBRD (SUTURE) IMPLANT
SYR 20CC LL (SYRINGE) ×2 IMPLANT
TAPE CLOTH SURG 4X10 WHT LF (GAUZE/BANDAGES/DRESSINGS) ×1 IMPLANT
TOWEL OR 17X26 10 PK STRL BLUE (TOWEL DISPOSABLE) ×2 IMPLANT
TOWEL OR NON WOVEN STRL DISP B (DISPOSABLE) ×2 IMPLANT
YANKAUER SUCT BULB TIP 10FT TU (MISCELLANEOUS) ×2 IMPLANT

## 2015-12-17 NOTE — Progress Notes (Signed)
Called to patient's room, where patient was found passed out in bed, pale, clammy.  Patient put in trendelenburg, and immediately became alert.  Despite this patient was dizzy, and nauseous.  Rapid response was called, [facility].  Patient immediately transferred to ICU, with blood emergently released.

## 2015-12-17 NOTE — Progress Notes (Signed)
CRITICAL VALUE ALERT  Critical value received:  Hgb 6.5  Date of notification:  12/17/15  Time of notification:  0619  Critical value read back: yes  Nurse who received alert:  Oris DroneMargaret Merline Perkin  MD notified (1st page):  Tsuei  Time of first page:  0620  Responding MD:  Corliss Skainssuei  Time MD responded:  [phone]

## 2015-12-17 NOTE — Transfer of Care (Signed)
Immediate Anesthesia Transfer of Care Note  Patient: Tommy ParishVincent Buechele  Procedure(s) Performed: Procedure(s): EXAM UNDER ANESTHESIA, flexible sigmoidoscopy hemmorhoid ligation (N/A)  Patient Location: PACU  Anesthesia Type:General  Level of Consciousness: awake, alert  and oriented  Airway & Oxygen Therapy: Patient Spontanous Breathing and Patient connected to face mask oxygen  Post-op Assessment: Report given to RN and Post -op Vital signs reviewed and stable  Post vital signs: Reviewed and stable  Last Vitals:  Filed Vitals:   12/17/15 0950 12/17/15 1005  BP: 79/36 66/42  Pulse: 93   Temp: 36.7 C 36.6 C  Resp: 14     Last Pain:  Filed Vitals:   12/17/15 1049  PainSc: 0-No pain         Complications: No apparent anesthesia complications

## 2015-12-17 NOTE — Progress Notes (Signed)
[address] [place], [place] [address] Phone: [phone] FAX: [phone]   Tommy Page [id number] 10/21/1960  CARE TEAM:  PCP: London Pepper, MD  Outpatient Care Team: Patient Care Team: London Pepper, MD as PCP - General (Family Medicine) Michael Boston, MD as Consulting Physician (General Surgery) Arta Silence, MD as Consulting Physician (Gastroenterology)  Inpatient Treatment Team: Treatment Team: Attending Provider: Michael Boston, MD; Technician: Wilder Glade, NT; Rounding Team: Md Ccs, MD  Problem List:   Principal Problem:   Symptomatic anemia Active Problems:   Prolapsed internal hemorrhoids, grade 4 s/p THD ligation & hemorrhoidectomy 12/03/2015    Day of Surgery  12/16/2015 - 12/17/2015  Procedure(s): EXAM UNDER ANESTHESIA, flexible sigmoidoscopy   Assessment  Another episode of rectal bleeding with drop in hemoglobin.  He will likely stable.  No evidence of bleeding for eight hours.  Plan:  -Agree with transfusing two units of blood.  Oral iron.  Try to observe for now.  Most intermittent episodes of rectal bleeding, or transient.  Most likely some disruption of the Vicryl sutures.  Usually stops on its own.  However, if he has another episode of persistent bleeding, further drop in hemoglobin, evidence of orthostasis or shock; then, proceed with examination under anesthesia with possible endoscopy to control and rule out bleeding and hemorrhoids or more proximal sites.  The fact that he had a normal colonoscopy without even diverticulosis on colonoscopy last month that argues against a more proximal problem.  Patient has more convinced to concerned that that is the problem.  He has not had hematemesis and denies any heartburn, reflux.  While he has had a few nonsteroidals.  He is not taking them chronically so I am skeptical of and foregut etiology.  No melena.  But may check that as well.  If  everything else is negative.  I does not want another surgery, he is anxious about trying to figure out what is going on.  Because he feels better right now he is okay with observing today and see how things go. -VTE prophylaxis- SCDs, etc -mobilize as tolerated to help recovery  [name], M.D., F.A.C.S. Gastrointestinal and Minimally Invasive Surgery [facility] [address] [place], [place] [address] [phone] Main / Paging   12/17/2015  Subjective:  No bleeding since midnight.  Denies any anorectal pain.  Does have some fecal urgency.  Feels like he passes a little blood when he needs to have flatus.  He had been moving his bowels softer on the Benefiber as we had discussed a few days ago on the phone.  Feels like sitz bath help but then worries he bleeds more after them.  Using the zinc oxide Desitin diaper rash cream has helped a lot.Marland Kitchen  He actually had a few really good days until last night when the significant bleeding scare.  He is very frustrated.  No problems with urination.  Claims he has been eating rather well.  Appetite not normal but getting better.   Trying to drink fluids.  No nausea or vomiting.  No sick contacts.  No heartburn or reflux.  No hematemesis.  Take a pain medication two days ago.  It was Aleve only.  Had no major issues when seen in the office two days ago.  Objective:  Vital signs:  Filed Vitals:   12/16/15 2235 12/17/15 0528 12/17/15 0950 12/17/15 1005  BP:  87/53 79/36 66/42   Pulse:  79 93   Temp:  98.2 F (36.8 C) 98.1 F (36.7 C) 97.8 F (36.6 C)  TempSrc:  Oral Oral Oral  Resp:  15 14   Height: 5' 10"  (1.778 m)     Weight: 78.9 kg (173 lb 15.1 oz)     SpO2:  99% 100%     Last BM Date: 12/16/15 (blood clots)  Intake/Output   Yesterday:  05/23 0701 - 05/24 0700 In: 250 [I.V.:250] Out: -  This shift:  Total I/O In: 280 [I.V.:250; Blood:30] Out: -   Bowel function:  Flatus:  YES  BM:  YES  Drain: (No drain)   Physical Exam:  General: Pt awake/alert/oriented x4 in No acute distress.  He does look pale. Eyes: PERRL, normal EOM.  Sclera clear.  No icterus Neuro: CN II-XII intact w/o focal sensory/motor deficits. Lymph: No head/neck/groin lymphadenopathy Psych:  No delerium/psychosis/paranoia HENT: Normocephalic, Mucus membranes moist.  No thrush Neck: Supple, No tracheal deviation Chest: No chest wall pain w good excursion CV:  Pulses intact.  Regular rhythm MS: Normal AROM mjr joints.  No obvious deformity Abdomen: Soft.  Nondistended.  Nontender.  No evidence of peritonitis.  No incarcerated hernias. Genital: Notable hernias.  No lymphadenopathy.  Normal external male genitalia.  Rectal: Powder in the region.  Gently washed off.  Some mild irritation rash consistent with some.  His.  No external hemorrhoid wounds.  No prolapse.  No fissure.  No abscess.  No fistula.  Sphincter tone seems normal.  I held off on any digital exam at the bedside. Ext:  SCDs BLE.  No mjr edema.  No cyanosis Skin: No petechiae / purpura  Results:   Labs: Results for orders placed or performed during the hospital encounter of 12/16/15 (from the past 48 hour(s))  Comprehensive metabolic panel     Status: Abnormal   Collection Time: 12/16/15  9:02 PM  Result Value Ref Range   Sodium 135 135 - 145 mmol/L   Potassium 3.5 3.5 - 5.1 mmol/L   Chloride 104 101 - 111 mmol/L   CO2 22 22 - 32 mmol/L   Glucose, Bld 115 (H) 65 - 99 mg/dL   BUN 11 6 - 20 mg/dL   Creatinine, Ser 0.89 0.61 - 1.24 mg/dL   Calcium 8.2 (L) 8.9 - 10.3 mg/dL   Total Protein 6.0 (L) 6.5 - 8.1 g/dL   Albumin 3.4 (L) 3.5 - 5.0 g/dL   AST 20 15 - 41 U/L   ALT 18 17 - 63 U/L   Alkaline Phosphatase 42 38 - 126 U/L   Total Bilirubin 0.4 0.3 - 1.2 mg/dL   GFR calc non Af Amer >60 >60 mL/min   GFR calc Af Amer >60 >60 mL/min    Comment: (NOTE) The eGFR has been calculated using the CKD EPI equation. This  calculation has not been validated in all clinical situations. eGFR's persistently <60 mL/min signify possible Chronic Kidney Disease.    Anion gap 9 5 - 15  CBC     Status: Abnormal   Collection Time: 12/16/15  9:02 PM  Result Value Ref Range   WBC 16.0 (H) 4.0 - 10.5 K/uL   RBC 3.19 (L) 4.22 - 5.81 MIL/uL   Hemoglobin 9.3 (L) 13.0 - 17.0 g/dL   HCT 26.7 (L) 39.0 - 52.0 %   MCV 83.7 78.0 - 100.0 fL   MCH 29.2 26.0 - 34.0 pg   MCHC 34.8 30.0 - 36.0 g/dL   RDW 12.8 11.5 -  15.5 %   Platelets 243 150 - 400 K/uL  Type and screen [place]     Status: None (Preliminary result)   Collection Time: 12/16/15  9:02 PM  Result Value Ref Range   ABO/RH(D) O POS    Antibody Screen NEG    Sample Expiration 12/19/2015    Unit Number [id number]    Blood Component Type RED CELLS,LR    Unit division 00    Status of Unit ISSUED    Transfusion Status OK TO TRANSFUSE    Crossmatch Result Compatible    Unit Number [id number]    Blood Component Type RED CELLS,LR    Unit division 00    Status of Unit ISSUED    Transfusion Status OK TO TRANSFUSE    Crossmatch Result Compatible    Unit Number [id number]    Blood Component Type RED CELLS,LR    Unit division 00    Status of Unit ALLOCATED    Transfusion Status OK TO TRANSFUSE    Crossmatch Result Compatible    Unit Number [id number]    Blood Component Type RBC LR PHER2    Unit division 00    Status of Unit ALLOCATED    Transfusion Status OK TO TRANSFUSE    Crossmatch Result Compatible   I-Stat Chem 8, ED     Status: Abnormal   Collection Time: 12/16/15  9:10 PM  Result Value Ref Range   Sodium 138 135 - 145 mmol/L   Potassium 3.4 (L) 3.5 - 5.1 mmol/L   Chloride 101 101 - 111 mmol/L   BUN 9 6 - 20 mg/dL   Creatinine, Ser 1.00 0.61 - 1.24 mg/dL   Glucose, Bld 107 (H) 65 - 99 mg/dL   Calcium, Ion 1.03 (L) 1.12 - 1.23 mmol/L   TCO2 21 0 - 100 mmol/L   Hemoglobin 8.5 (L) 13.0 - 17.0 g/dL   HCT 25.0 (L) 39.0  - 52.0 %  CBC     Status: Abnormal   Collection Time: 12/17/15  5:42 AM  Result Value Ref Range   WBC 9.3 4.0 - 10.5 K/uL   RBC 2.23 (L) 4.22 - 5.81 MIL/uL   Hemoglobin 6.5 (LL) 13.0 - 17.0 g/dL    Comment: RESULT REPEATED AND VERIFIED DELTA CHECK NOTED CRITICAL RESULT CALLED TO, READ BACK BY AND VERIFIED WITH: Aleda Grana RN @ [phone] ON 12/17/15 BY C DAVIS    HCT 18.5 (L) 39.0 - 52.0 %   MCV 83.0 78.0 - 100.0 fL   MCH 29.1 26.0 - 34.0 pg   MCHC 35.1 30.0 - 36.0 g/dL   RDW 13.1 11.5 - 15.5 %   Platelets 226 150 - 400 K/uL  Basic metabolic panel     Status: Abnormal   Collection Time: 12/17/15  5:42 AM  Result Value Ref Range   Sodium 138 135 - 145 mmol/L   Potassium 3.7 3.5 - 5.1 mmol/L   Chloride 111 101 - 111 mmol/L   CO2 23 22 - 32 mmol/L   Glucose, Bld 100 (H) 65 - 99 mg/dL   BUN 9 6 - 20 mg/dL   Creatinine, Ser 0.83 0.61 - 1.24 mg/dL   Calcium 7.7 (L) 8.9 - 10.3 mg/dL   GFR calc non Af Amer >60 >60 mL/min   GFR calc Af Amer >60 >60 mL/min    Comment: (NOTE) The eGFR has been calculated using the CKD EPI equation. This calculation has not been validated in all clinical situations. eGFR's persistently <  60 mL/min signify possible Chronic Kidney Disease.    Anion gap 4 (L) 5 - 15  Prepare RBC     Status: None   Collection Time: 12/17/15  7:00 AM  Result Value Ref Range   Order Confirmation ORDER PROCESSED BY BLOOD BANK     Imaging / Studies: No results found.  Medications / Allergies: per chart  Antibiotics: Anti-infectives    Start     Dose/Rate Route Frequency Ordered Stop   12/17/15 1030  ceFAZolin (ANCEF) IVPB 2g/100 mL premix    Comments:  Pharmacy may adjust dosing strength, interval, or rate of medication as needed for optimal therapy for the patient  Send with patient on call to the OR.  Anesthesia to complete antibiotic administration <68mn prior to [facility]   2 g 200 mL/hr over 30 Minutes Intravenous On call to O.R. 12/17/15 1025  12/18/15 0559   12/17/15 1030  metroNIDAZOLE (FLAGYL) IVPB 500 mg    Comments:  Pharmacy may adjust dosing strength, interval, or rate of medication as needed for optimal therapy for the patient Send with patient on call to the OR.  Anesthesia to complete antibiotic administration <659m prior to [facility]  500 mg 100 mL/hr over 60 Minutes Intravenous On call to O.R. 12/17/15 1025 12/18/15 0559        Note: Portions of this report may have been transcribed using voice recognition software. Every effort was made to ensure accuracy; however, inadvertent computerized transcription errors may be present.   Any transcriptional errors that result from this process are unintentional.     StAdin HectorM.D., F.A.C.S. Gastrointestinal and Minimally Invasive Surgery [facility] [address] [place] [phone] / Paging   12/17/2015

## 2015-12-17 NOTE — Progress Notes (Signed)
Rapid Response Event Note  Overview: 0920 Called to 1512 to assist with patient experiencing hypotension and rectal bleeding  Initial Focused Assessment: When arrived at bedside, patient was extremely pale, awake and alert, and diaphoretic.  Patient in trendelenberg position due to persistent hypotension.  Patient on bedpan, has been passing BRBPR according to patient's RN.  Patient had two syncopal episodes this morning also.  Bright red blood with clots observed in bedpan when removed from under patient.  Interventions: Patient kept in trendelenberg position, SBP 80-90s.  HR 90s sinus on telemetry.  1L LR bolus #2 infusing per MD order.  Patient seen by Jorje GuildMegan Baird, PA with surgery.  Received order to move patient to stepdown bed.  Event Summary: Patient with symptomatic active lower GI bleed assessed and transferred to stepdown bed, where he was then emergently transferred to the OR for exploration.   Bonnielee Haffussell, Mohini Heathcock Allen

## 2015-12-17 NOTE — Progress Notes (Signed)
[facility] Progress Note     Subjective: Pt reportedly passed out and rapid response was called.  He starting passing a lot of bloody clots per rectum.  He was found to be hypotensive and pale.  Blood is pending emergency release as cross match was not finalized yet.  I discussed with the patient the plans to go to the OR as well as with his brother and mother.  He reportedly has been having 2 weeks of bloody clots.  Objective: Vital signs in last 24 hours: Temp:  [97.6 F (36.4 C)-98.2 F (36.8 C)] 98.2 F (36.8 C) (05/24 0528) Pulse Rate:  [64-79] 79 (05/24 0528) Resp:  [13-20] 15 (05/24 0528) BP: (86-106)/(53-75) 87/53 mmHg (05/24 0528) SpO2:  [96 %-100 %] 99 % (05/24 0528) Weight:  [173 lb 15.1 oz (78.9 kg)] 173 lb 15.1 oz (78.9 kg) (05/23 2235) Last BM Date: 12/16/15 (blood clots)  Intake/Output from previous day: 05/23 0701 - 05/24 0700 In: 250 [I.V.:250] Out: -  Intake/Output this shift:    PE: Gen:  Alert, NAD, pleasant Card:  Tachycardic, normal rhythm, no M/G/R heard Pulm:  CTA, no W/R/R, good effort, but SOB Abd: Soft, NT/ND, +BS, no HSM, incisions C/D/I, drain with minimal sanguinous drainage, no abdominal scars noted Ext/Skin:  Moves all extremities, skin is pale, MM dry  Lab Results:   Recent Labs  12/16/15 2102 12/16/15 2110 12/17/15 0542  WBC 16.0*  --  9.3  HGB 9.3* 8.5* 6.5*  HCT 26.7* 25.0* 18.5*  PLT 243  --  226   BMET  Recent Labs  12/16/15 2102 12/16/15 2110 12/17/15 0542  NA 135 138 138  K 3.5 3.4* 3.7  CL 104 101 111  CO2 22  --  23  GLUCOSE 115* 107* 100*  BUN 11 9 9   CREATININE 0.89 1.00 0.83  CALCIUM 8.2*  --  7.7*   PT/INR No results for input(s): LABPROT, INR in the last 72 hours. CMP     Component Value Date/Time   NA 138 12/17/2015 0542   K 3.7 12/17/2015 0542   CL 111 12/17/2015 0542   CO2 23 12/17/2015 0542   GLUCOSE 100* 12/17/2015 0542   BUN 9 12/17/2015 0542   CREATININE 0.83 12/17/2015 0542   CALCIUM 7.7* 12/17/2015 0542   PROT 6.0* 12/16/2015 2102   ALBUMIN 3.4* 12/16/2015 2102   AST 20 12/16/2015 2102   ALT 18 12/16/2015 2102   ALKPHOS 42 12/16/2015 2102   BILITOT 0.4 12/16/2015 2102   GFRNONAA >60 12/17/2015 0542   GFRAA >60 12/17/2015 0542   Lipase  No results found for: LIPASE     Studies/Results: No results found.  Anti-infectives: Anti-infectives    None       Assessment/Plan  Bleeding per rectum after THD/External hemorrhoidectomy on 12/03/15 - Dr. Michaell CowingGross Syncopal episodes/orthostasis Acute blood loss anemia - Hgb 6.5 -Emergent release of blood until we can get cross/match -IVF wide open, bed rest, NPO -Posted for urgent exam under anesthesia and flex sig to try and stop the bleeding -Transfer to SDU for closer monitoring before ICU   Nonie HoyerMegan N Jerremy Maione 12/17/2015, 9:44 AM Pager: [phone]  (7am - 4:30pm M-F; 7am - 11:30am Sa/Su)

## 2015-12-17 NOTE — Anesthesia Procedure Notes (Signed)
Procedure Name: Intubation Date/Time: 12/17/2015 10:47 AM Performed by: Enriqueta ShutterWILLIFORD, Tommy Page D Pre-anesthesia Checklist: Patient identified, Emergency Drugs available, Suction available and Patient being monitored Patient Re-evaluated:Patient Re-evaluated prior to inductionOxygen Delivery Method: Circle System Utilized Preoxygenation: Pre-oxygenation with 100% oxygen Intubation Type: IV induction, Rapid sequence and Cricoid Pressure applied Ventilation: Mask ventilation without difficulty Laryngoscope Size: Mac and 4 Grade View: Grade I Tube type: Oral Tube size: 7.5 mm Number of attempts: 1 Airway Equipment and Method: Stylet and Oral airway Placement Confirmation: ETT inserted through vocal cords under direct vision,  positive ETCO2 and breath sounds checked- equal and bilateral Secured at: 22 cm Tube secured with: Tape Dental Injury: Teeth and Oropharynx as per pre-operative assessment

## 2015-12-17 NOTE — Anesthesia Postprocedure Evaluation (Signed)
Anesthesia Post Note  Patient: Loletta ParishVincent Sherry  Procedure(s) Performed: Procedure(s) (LRB): EXAM UNDER ANESTHESIA, flexible sigmoidoscopy hemmorhoid ligation (N/A)  Patient location during evaluation: PACU Anesthesia Type: General Level of consciousness: awake and alert Pain management: pain level controlled Vital Signs Assessment: post-procedure vital signs reviewed and stable Respiratory status: spontaneous breathing, nonlabored ventilation, respiratory function stable and patient connected to nasal cannula oxygen Cardiovascular status: blood pressure returned to baseline and stable Postop Assessment: no signs of nausea or vomiting Anesthetic complications: no Comments: Looks great.. Stable hemodynamics.. Bilateral perfusion is good to both hands, sensation and motor intact, no pain    Last Vitals:  Filed Vitals:   12/17/15 1215 12/17/15 1219  BP: 112/74   Pulse: 95 88  Temp:    Resp: 13 12    Last Pain:  Filed Vitals:   12/17/15 1224  PainSc: 0-No pain                 Reino KentJudd, Sigourney Portillo J

## 2015-12-17 NOTE — Op Note (Addendum)
12/16/2015 - 12/17/2015  12:24 PM  PATIENT:  Tommy Page  55 y.o. male  Patient Care Team: Farris Has, MD as PCP - General (Family Medicine) Karie Soda, MD as Consulting Physician (General Surgery) Willis Modena, MD as Consulting Physician (Gastroenterology)  PRE-OPERATIVE DIAGNOSIS:  Rectal Bleeding s/p hemorrhoid surgery  POST-OPERATIVE DIAGNOSIS:  Rectal bleeding from internal hemorrhoidectomy wound  PROCEDURE:   EXAM UNDER ANESTHESIA Flexible sigmoidoscopy Hemorrhoid ligation   SURGEON:  Surgeon(s): Karie Soda, MD  ANESTHESIA:   Local field block Anorectal block General  0.25% bupivacaine with epinephrine at the end of the case.  EBL:  Total I/O In: 4235 [I.V.:3200; Blood:1035] Out: 250 [Blood:250]  Delay start of Pharmacological VTE agent (>24hrs) due to surgical blood loss or risk of bleeding:  NO  DRAINS: NONE  SPECIMEN:  No Specimen  DISPOSITION OF SPECIMEN:  N/A  COUNTS:  YES  PLAN OF CARE: Discharge home after PACU  PATIENT DISPOSITION:  PACU - hemodynamically stable.  INDICATION: Pleasant patient with struggles with hemorrhoids.  Not able to be managed in the office despite an improved bowel regimen.  I recommended examination under anesthesia and surgical treatment.  He underwent THD hemorrhoidal ligation and pexy for internal hemorrhoids with external hemorrhoidectomy.  He struggled with urinary retention initially been improved.  Had intermittent rectal bleeding.  Past out on Flomax and had episode of bleeding.  Improved and resolved.  Irregular bowels.  Was beginning to get better with more soft regular bowel movements on a Benefiber bowel regimen.  Did not have much pain at all.  Last night, had an episode of significant rectal bleeding with large volume of clots.  Was concerned.  Called the ambulance.  He went to the emergency room.  Not in shock and hemoglobin initially okay.  However, given concerning persistent symptoms was admitted for  observation.  Decrease hemoglobin on follow-up after hydration.  Blood ordered to transfuse.  Not in shock and calm.  I recommended observation, transfusuion & bowel regimen as no bleeding since in the ED, ctal bleeding episodes are usually self-resolving & do not benefit from surgery unless persistent or hemodynamically worsens.  Colorectal partner, Dr Maisie Fus agreed.    Unfortunately, pt suddenly had an episode of severe rectal bleeding with hypotension last hour.  I recommended emergent examination under anesthesia with probable hemorrhoid ligation.  Endoscopy to rule out more proximal source despite having a normal colonoscopy last month.  :  The anatomy & physiology of the anorectal region was discussed.  The pathophysiology of hemorrhoids and differential diagnosis was discussed.  Natural history risks without surgery was discussed.   I stressed the importance of a bowel regimen to have daily soft bowel movements to minimize progression of disease.  Interventions such as sclerotherapy & banding were discussed.  The patient's symptoms are not adequately controlled by medicines and other non-operative treatments.  I feel the risks & problems of no surgery outweigh the operative risks; therefore, I recommended surgery to treat the hemorrhoids by ligation, pexy, and possible resection.  Risks such as bleeding, infection, need for further treatment, heart attack, death, and other risks were discussed.   I noted a good likelihood this will help address the problem.  Goals of post-operative recovery were discussed as well.  Possibility that this will not correct all symptoms was explained.  Post-operative pain, bleeding, constipation, urinary difficulties, and other problems after surgery were discussed.  We will work to minimize complications.   Educational handouts further explaining the pathology, treatment options,  and bowel regimen were given as well.  Questions were answered.  The patient expresses  understanding & wishes to proceed with surgery.   OR FINDINGS: 15 mm separation proximal end of hemorrhoidal ligation on right anterior pile.  Source of brisk bleeding.  Controlled with interrupted 2-0 Vicryl sutures.  Hemorrhoid ligation at other locations left lateral, right posterior aspects with mild appropriate healing inflammation.  No ulceration or bleeding.  No thrombosed hemorrhoid.  No fissure.  No fistula.  No rectal prolapse.  No proctitis.    Flexible sigmoidoscopy to 50 cm otherwise normal and clean.  Nasogastric tube clean without any evidence of gastric blood.  DESCRIPTION:   Informed consent was confirmed. Patient underwent general anesthesia without difficulty. Patient was placed into high lithotomy positioning with moderate Trendelenburg.  He artery receive one unit of blood prior to surgery.  He received two more units by the end of the case. The perianal region was prepped and draped in sterile fashion. Surgical time-out confirmed our plan.  I did digital rectal examination and then transitioned over to anoscopy to get a sense of the anatomy.  I evacuated.  Moderate volume of bright red rectal clots.  Findings noted above.   Can note some brisk bleeding from the right anterior aspect about 2 cm proximal to the sphincters.  I could palpate a C separation of the running 2-0 Vicryl suture as source of bleeding.  I did interrupted figure-of-eight sutures with 2-0 Vicryl suture to help close that wound.  Due for ligation.  Also did a few more 2 cm proximally in the rectum to help better control.  The incoming hemorrhoidal arterial branch.  Bleeding stopped.  He had a little oozing on the left posterior lateral aspect, singly to proximally.  I did a figure-of-eight stitch there.  Rest of the hemorrhoidectomy ligation and were okay.  He had very minimal external hemorrhoid wounds.  At this point.  No stricturing.  Some inflamed anal crypts consistent with only two weeks status post  hemorrhoid surgery, but otherwise okay.  I used flexible sigmoidoscopy drains anally and was able to get 60 cm up.  Rectosigmoid colon was clean and pristine.  No diverticulosis.  No diverticulitis.  No tumor or mass.  No residual clot.  All.  Somewhat tortuous near the proximal sigmoid colon, but otherwise okay.  I deflated carbon dioxide and slowly withdrew.  Again, saw no bleeding.  Came down to the distal rectum.  Ligations intact at the hemorrhoidal region.  No active bleeding.  Intraluminal carbon dioxide evacuated.  There is no prolapse. External anatomy looked normal.  I repeated anoscopy and examination.  Hemostasis was good.  I did place a large Gelfoam in the distal rectal vault to encourage hemostasis.  The more bupivacaine with epinephrine unit for field and anorectal block, especially on the right and anterior aspects.  The patient is calm in the recovery room stable.  No longer tachycardic.  Normotensive.  However, we will watch in the stepdown unit.   I discussed my plan with the patient as well as his mother and brother before surgery.  Came out after surgery & discussed intraoperative findings and recommendations with the patient's mother and son.  Went over things extensively.  Questions were answered.  They expressed understanding and appreciation.  Discussed with referring gastroenterologist, Dr. Odelia GageWill Outlaw as well.      Ardeth SportsmanSteven C. Tayleigh Wetherell, M.D., F.A.C.S. Gastrointestinal and Minimally Invasive Surgery [facility] [phone]  [place], [place] [address] [phone] Main / Paging

## 2015-12-17 NOTE — Progress Notes (Signed)
Patient had large volume bloody bowel movement, with large blood clots.  Patient became dizzy while standing up to do orthostatic vitals.  Will notify MD.

## 2015-12-17 NOTE — Care Management Note (Signed)
Case Management Note  Patient Details  Name: Tommy Page MRN: [id number] Date of Birth: 1960/12/11  Subjective/Objective: transfer to SDU-rectal bleed. 55 y/o m admitted w/Symptomatic anemia. From home.                   Action/Plan:d/c plan home.   Expected Discharge Date:   (unknown)               Expected Discharge Plan:  Home/Self Care  In-House Referral:     Discharge planning Services  CM Consult  Post Acute Care Choice:    Choice offered to:     DME Arranged:    DME Agency:     HH Arranged:    HH Agency:     Status of Service:  In process, will continue to follow  Medicare Important Message Given:    Date Medicare IM Given:    Medicare IM give by:    Date Additional Medicare IM Given:    Additional Medicare Important Message give by:     If discussed at Long Length of Stay Meetings, dates discussed:    Additional Comments:  Tommy Page, Tommy Marcos, RN 12/17/2015, 10:19 AM

## 2015-12-17 NOTE — Anesthesia Preprocedure Evaluation (Signed)
Anesthesia Evaluation  Patient identified by MRN, date of birth, ID band Patient confused    Reviewed: H&P Preop documentation limited or incomplete due to emergent nature of procedure.  History of Anesthesia Complications Negative for: history of anesthetic complications  Airway Mallampati: II  TM Distance: >3 FB Neck ROM: full    Dental no notable dental hx.    Pulmonary former smoker,    Pulmonary exam normal breath sounds clear to auscultation       Cardiovascular negative cardio ROS   Rhythm:regular Rate:Tachycardia     Neuro/Psych PSYCHIATRIC DISORDERS Anxiety Depression negative neurological ROS     GI/Hepatic Neg liver ROS, GERD  Medicated,  Endo/Other  negative endocrine ROS  Renal/GU negative Renal ROS     Musculoskeletal   Abdominal   Peds  Hematology  (+) anemia ,   Anesthesia Other Findings Acutely anemic with Hgb 6.4 Appears pale and diaphoretic  Reproductive/Obstetrics negative OB ROS                             Anesthesia Physical  Anesthesia Plan  ASA: III and emergent  Anesthesia Plan: General   Post-op Pain Management:    Induction: Intravenous and Rapid sequence  Airway Management Planned: Oral ETT  Additional Equipment:   Intra-op Plan:   Post-operative Plan: Extubation in OR  Informed Consent: I have reviewed the patients History and Physical, chart, labs and discussed the procedure including the risks, benefits and alternatives for the proposed anesthesia with the patient or authorized representative who has indicated his/her understanding and acceptance.   Dental Advisory Given  Plan Discussed with: Anesthesiologist, CRNA and Surgeon  Anesthesia Plan Comments:         Anesthesia Quick Evaluation

## 2015-12-18 DIAGNOSIS — F419 Anxiety disorder, unspecified: Secondary | ICD-10-CM | POA: Diagnosis present

## 2015-12-18 DIAGNOSIS — H9190 Unspecified hearing loss, unspecified ear: Secondary | ICD-10-CM

## 2015-12-18 DIAGNOSIS — R338 Other retention of urine: Secondary | ICD-10-CM

## 2015-12-18 DIAGNOSIS — R55 Syncope and collapse: Secondary | ICD-10-CM

## 2015-12-18 DIAGNOSIS — K219 Gastro-esophageal reflux disease without esophagitis: Secondary | ICD-10-CM | POA: Diagnosis present

## 2015-12-18 LAB — CBC
HCT: 24.9 % — ABNORMAL LOW (ref 39.0–52.0)
Hemoglobin: 8.7 g/dL — ABNORMAL LOW (ref 13.0–17.0)
MCH: 30 pg (ref 26.0–34.0)
MCHC: 34.9 g/dL (ref 30.0–36.0)
MCV: 85.9 fL (ref 78.0–100.0)
PLATELETS: 184 10*3/uL (ref 150–400)
RBC: 2.9 MIL/uL — ABNORMAL LOW (ref 4.22–5.81)
RDW: 14.1 % (ref 11.5–15.5)
WBC: 22.9 10*3/uL — ABNORMAL HIGH (ref 4.0–10.5)

## 2015-12-18 LAB — APTT: APTT: 33 s (ref 24–37)

## 2015-12-18 LAB — PROTIME-INR
INR: 1.26 (ref 0.00–1.49)
PROTHROMBIN TIME: 16 s — AB (ref 11.6–15.2)

## 2015-12-18 LAB — FIBRINOGEN: Fibrinogen: 285 mg/dL (ref 204–475)

## 2015-12-18 NOTE — Progress Notes (Signed)
Pt complaining of being unable to urinate. RN helped pt stand up at bedside to use urinal. Pt unable to void and stated he felt a "little dizzy" when standing. RN helped pt back to bed and pt agreed to have a foley catheter placed. Foley catheter inserted per order, 500+ cc of urine noted in foley bag. Will continue to monitor.

## 2015-12-18 NOTE — Progress Notes (Signed)
RN received report from The Procter & GambleMike RN, Pt arrived unit from ICU, alert and oriented, able to communicate needs. Will continue with current plan of care.

## 2015-12-18 NOTE — Progress Notes (Signed)
[facility]  [address] [place], [place] [address] Phone: [phone] FAX: [phone]   Tommy Page [id number] 1961/03/25  CARE TEAM:  PCP: London Pepper, MD  Outpatient Care Team: Patient Care Team: London Pepper, MD as PCP - General (Family Medicine) Michael Boston, MD as Consulting Physician (General Surgery) Arta Silence, MD as Consulting Physician (Gastroenterology)  Inpatient Treatment Team: Treatment Team: Attending Provider: Michael Boston, MD; Technician: Wilder Glade, NT; Rounding Team: Md Edison Pace, MD; Technician: Johnnette Litter, NT; Registered Nurse: Casimer Bilis, RN  Problem List:   Principal Problem:   Rectal bleeding s/p rectal ulcer ligation 12/17/2015 Active Problems:   Prolapsed internal hemorrhoids, grade 4 s/p THD ligation & hemorrhoidectomy 12/03/2015    Symptomatic anemia   S/P hemorrhoidectomy   1 Day Post-Op  12/17/2015  POST-OPERATIVE DIAGNOSIS: Rectal bleeding from internal hemorrhoidectomy wound  PROCEDURE:  EXAM UNDER ANESTHESIA Flexible sigmoidoscopy Hemorrhoid ligation  SURGEON: Surgeon(s): Michael Boston, MD   Assessment  Stabilizing  Plan:  -transfer to floor -bowel regimen -hold anticoagulation x 24hours -Foley x 3 days -adv diet -VTE prophylaxis- SCDs, etc -mobilize as tolerated to help recovery  I updated the patient's status to the patient and nurse.  Recommendations were made.  Questions were answered.  They expressed understanding & appreciation.   [name], M.D., F.A.C.S. Gastrointestinal and Minimally Invasive Surgery [facility] [address], [place], [place] [address] [phone] Main / Paging   12/18/2015  Subjective:  Problems with urination - Foley placed Pain controlled - morphine & ice help Tol liquids ICU RNs nearby  Objective:  Vital signs:  Filed Vitals:   12/18/15 0300 12/18/15 0400 12/18/15 0500  12/18/15 0600  BP:      Pulse: 65 65 70 66  Temp:  98 F (36.7 C)    TempSrc:  Oral    Resp: 12 11 17 12   Height:      Weight:      SpO2: 100% 100% 100% 100%    Last BM Date: 12/17/15  Intake/Output   Yesterday:  05/24 0701 - 05/25 0700 In: 7782.9 [P.O.:300; I.V.:6447.9; Blood:1035] Out: 2700 [Urine:2450; Blood:250] This shift:     Bowel function:  Flatus: YES  BM:  Small  Drain: (No drain)   Physical Exam:  General: Pt awake/alert/oriented x4 in No acute distress Eyes: PERRL, normal EOM.  Sclera clear.  No icterus Neuro: CN II-XII intact w/o focal sensory/motor deficits. Lymph: No head/neck/groin lymphadenopathy Psych:  No delerium/psychosis/paranoia HENT: Normocephalic, Mucus membranes moist.  No thrush Neck: Supple, No tracheal deviation Chest: No chest wall pain w good excursion CV:  Pulses intact.  Regular rhythm MS: Normal AROM mjr joints.  No obvious deformity Abdomen: Soft.  Nondistended.  Nontender.  No evidence of peritonitis.  No incarcerated hernias. Ext:  SCDs BLE.  No mjr edema.  No cyanosis Skin: No petechiae / purpura  Results:   Labs: Results for orders placed or performed during the hospital encounter of 12/16/15 (from the past 48 hour(s))  Comprehensive metabolic panel     Status: Abnormal   Collection Time: 12/16/15  9:02 PM  Result Value Ref Range   Sodium 135 135 - 145 mmol/L   Potassium 3.5 3.5 - 5.1 mmol/L   Chloride 104 101 - 111 mmol/L   CO2 22 22 - 32 mmol/L   Glucose, Bld 115 (H) 65 - 99 mg/dL   BUN 11 6 - 20 mg/dL  Creatinine, Ser 0.89 0.61 - 1.24 mg/dL   Calcium 8.2 (L) 8.9 - 10.3 mg/dL   Total Protein 6.0 (L) 6.5 - 8.1 g/dL   Albumin 3.4 (L) 3.5 - 5.0 g/dL   AST 20 15 - 41 U/L   ALT 18 17 - 63 U/L   Alkaline Phosphatase 42 38 - 126 U/L   Total Bilirubin 0.4 0.3 - 1.2 mg/dL   GFR calc non Af Amer >60 >60 mL/min   GFR calc Af Amer >60 >60 mL/min    Comment: (NOTE) The eGFR has been calculated using the CKD EPI  equation. This calculation has not been validated in all clinical situations. eGFR's persistently <60 mL/min signify possible Chronic Kidney Disease.    Anion gap 9 5 - 15  CBC     Status: Abnormal   Collection Time: 12/16/15  9:02 PM  Result Value Ref Range   WBC 16.0 (H) 4.0 - 10.5 K/uL   RBC 3.19 (L) 4.22 - 5.81 MIL/uL   Hemoglobin 9.3 (L) 13.0 - 17.0 g/dL   HCT 26.7 (L) 39.0 - 52.0 %   MCV 83.7 78.0 - 100.0 fL   MCH 29.2 26.0 - 34.0 pg   MCHC 34.8 30.0 - 36.0 g/dL   RDW 12.8 11.5 - 15.5 %   Platelets 243 150 - 400 K/uL  Type and screen [place]     Status: None (Preliminary result)   Collection Time: 12/16/15  9:02 PM  Result Value Ref Range   ABO/RH(D) O POS    Antibody Screen NEG    Sample Expiration 12/19/2015    Unit Number [id number]    Blood Component Type RED CELLS,LR    Unit division 00    Status of Unit ISSUED,FINAL    Transfusion Status OK TO TRANSFUSE    Crossmatch Result Compatible    Unit Number [id number]    Blood Component Type RED CELLS,LR    Unit division 00    Status of Unit ISSUED,FINAL    Transfusion Status OK TO TRANSFUSE    Crossmatch Result Compatible    Unit Number [id number]    Blood Component Type RED CELLS,LR    Unit division 00    Status of Unit REL FROM [facility]    Transfusion Status OK TO TRANSFUSE    Crossmatch Result Compatible    Unit Number [id number]    Blood Component Type RBC LR PHER2    Unit division 00    Status of Unit ISSUED,FINAL    Transfusion Status OK TO TRANSFUSE    Crossmatch Result Compatible    Unit Number [id number]    Blood Component Type RED CELLS,LR    Unit division 00    Status of Unit ALLOCATED    Transfusion Status OK TO TRANSFUSE    Crossmatch Result Compatible    Unit Number [id number]    Blood Component Type RED CELLS,LR    Unit division 00    Status of Unit ALLOCATED    Transfusion Status OK TO TRANSFUSE    Crossmatch Result Compatible   I-Stat Chem 8, ED      Status: Abnormal   Collection Time: 12/16/15  9:10 PM  Result Value Ref Range   Sodium 138 135 - 145 mmol/L   Potassium 3.4 (L) 3.5 - 5.1 mmol/L   Chloride 101 101 - 111 mmol/L   BUN 9 6 - 20 mg/dL   Creatinine, Ser 1.00 0.61 - 1.24 mg/dL   Glucose, Bld 107 (H) 65 -  99 mg/dL   Calcium, Ion 1.03 (L) 1.12 - 1.23 mmol/L   TCO2 21 0 - 100 mmol/L   Hemoglobin 8.5 (L) 13.0 - 17.0 g/dL   HCT 25.0 (L) 39.0 - 52.0 %  CBC     Status: Abnormal   Collection Time: 12/17/15  5:42 AM  Result Value Ref Range   WBC 9.3 4.0 - 10.5 K/uL   RBC 2.23 (L) 4.22 - 5.81 MIL/uL   Hemoglobin 6.5 (LL) 13.0 - 17.0 g/dL    Comment: RESULT REPEATED AND VERIFIED DELTA CHECK NOTED CRITICAL RESULT CALLED TO, READ BACK BY AND VERIFIED WITH: Aleda Grana RN @ [phone] ON 12/17/15 BY C DAVIS    HCT 18.5 (L) 39.0 - 52.0 %   MCV 83.0 78.0 - 100.0 fL   MCH 29.1 26.0 - 34.0 pg   MCHC 35.1 30.0 - 36.0 g/dL   RDW 13.1 11.5 - 15.5 %   Platelets 226 150 - 400 K/uL  Basic metabolic panel     Status: Abnormal   Collection Time: 12/17/15  5:42 AM  Result Value Ref Range   Sodium 138 135 - 145 mmol/L   Potassium 3.7 3.5 - 5.1 mmol/L   Chloride 111 101 - 111 mmol/L   CO2 23 22 - 32 mmol/L   Glucose, Bld 100 (H) 65 - 99 mg/dL   BUN 9 6 - 20 mg/dL   Creatinine, Ser 0.83 0.61 - 1.24 mg/dL   Calcium 7.7 (L) 8.9 - 10.3 mg/dL   GFR calc non Af Amer >60 >60 mL/min   GFR calc Af Amer >60 >60 mL/min    Comment: (NOTE) The eGFR has been calculated using the CKD EPI equation. This calculation has not been validated in all clinical situations. eGFR's persistently <60 mL/min signify possible Chronic Kidney Disease.    Anion gap 4 (L) 5 - 15  Prepare RBC     Status: None   Collection Time: 12/17/15  7:00 AM  Result Value Ref Range   Order Confirmation ORDER PROCESSED BY BLOOD BANK   CBC     Status: Abnormal   Collection Time: 12/17/15 11:29 AM  Result Value Ref Range   WBC 22.9 (H) 4.0 - 10.5 K/uL   RBC 2.90 (L) 4.22 - 5.81  MIL/uL   Hemoglobin 8.7 (L) 13.0 - 17.0 g/dL    Comment: POST TRANSFUSION SPECIMEN   HCT 24.9 (L) 39.0 - 52.0 %   MCV 85.9 78.0 - 100.0 fL   MCH 30.0 26.0 - 34.0 pg   MCHC 34.9 30.0 - 36.0 g/dL   RDW 14.1 11.5 - 15.5 %   Platelets 184 150 - 400 K/uL  APTT     Status: None   Collection Time: 12/17/15 11:29 AM  Result Value Ref Range   aPTT 30 24 - 37 seconds  Protime-INR     Status: Abnormal   Collection Time: 12/17/15 11:29 AM  Result Value Ref Range   Prothrombin Time 18.0 (H) 11.6 - 15.2 seconds   INR 1.48 0.00 - 1.49  MRSA PCR Screening     Status: None   Collection Time: 12/17/15  5:32 PM  Result Value Ref Range   MRSA by PCR NEGATIVE NEGATIVE    Comment:        The GeneXpert MRSA Assay (FDA approved for NASAL specimens only), is one component of a comprehensive MRSA colonization surveillance program. It is not intended to diagnose MRSA infection nor to guide or monitor treatment for MRSA infections.  APTT     Status: None   Collection Time: 12/18/15  3:30 AM  Result Value Ref Range   aPTT 33 24 - 37 seconds  Protime-INR     Status: Abnormal   Collection Time: 12/18/15  3:30 AM  Result Value Ref Range   Prothrombin Time 16.0 (H) 11.6 - 15.2 seconds   INR 1.26 0.00 - 1.49  Fibrinogen     Status: None   Collection Time: 12/18/15  3:30 AM  Result Value Ref Range   Fibrinogen 285 204 - 475 mg/dL    Imaging / Studies: No results found.  Medications / Allergies: per chart  Antibiotics: Anti-infectives    Start     Dose/Rate Route Frequency Ordered Stop   12/17/15 1030  ceFAZolin (ANCEF) IVPB 2g/100 mL premix    Comments:  Pharmacy may adjust dosing strength, interval, or rate of medication as needed for optimal therapy for the patient  Send with patient on call to the OR.  Anesthesia to complete antibiotic administration <39mn [facility]   2 g 200 mL/hr over 30 Minutes Intravenous On call to O.R. 12/17/15 1025 12/17/15 1049    12/17/15 1030  metroNIDAZOLE (FLAGYL) IVPB 500 mg    Comments:  Pharmacy may adjust dosing strength, interval, or rate of medication as needed for optimal therapy for the patient Send with patient on call to the OR.  Anesthesia to complete antibiotic administration <664m [facility]  500 mg 100 mL/hr over 60 Minutes Intravenous On call to O.R. 12/17/15 1025 12/17/15 1104        Note: Portions of this report may have been transcribed using voice recognition software. Every effort was made to ensure accuracy; however, inadvertent computerized transcription errors may be present.   Any transcriptional errors that result from this process are unintentional.     StAdin HectorM.D., F.A.C.S. Gastrointestinal and Minimally Invasive Surgery [facility] [address] [phone] / Paging   12/18/2015

## 2015-12-19 LAB — PREPARE RBC (CROSSMATCH)

## 2015-12-19 LAB — CBC
HEMATOCRIT: 21.4 % — AB (ref 39.0–52.0)
HEMOGLOBIN: 7.4 g/dL — AB (ref 13.0–17.0)
MCH: 28.9 pg (ref 26.0–34.0)
MCHC: 34.6 g/dL (ref 30.0–36.0)
MCV: 83.6 fL (ref 78.0–100.0)
Platelets: 176 10*3/uL (ref 150–400)
RBC: 2.56 MIL/uL — ABNORMAL LOW (ref 4.22–5.81)
RDW: 15.1 % (ref 11.5–15.5)
WBC: 9.7 10*3/uL (ref 4.0–10.5)

## 2015-12-19 LAB — HEMOGLOBIN: HEMOGLOBIN: 6.4 g/dL — AB (ref 13.0–17.0)

## 2015-12-19 MED ORDER — HYDROCORTISONE ACE-PRAMOXINE 2.5-1 % RE CREA
1.0000 "application " | TOPICAL_CREAM | Freq: Four times a day (QID) | RECTAL | Status: DC | PRN
  Filled 2015-12-19: qty 30

## 2015-12-19 MED ORDER — SODIUM CHLORIDE 0.9 % IV SOLN
Freq: Once | INTRAVENOUS | Status: AC
  Administered 2015-12-19: 10:00:00 via INTRAVENOUS

## 2015-12-19 MED ORDER — ZINC OXIDE 40 % EX OINT
TOPICAL_OINTMENT | Freq: Two times a day (BID) | CUTANEOUS | Status: DC
  Administered 2015-12-19 – 2015-12-20 (×2): via TOPICAL
  Filled 2015-12-19: qty 114

## 2015-12-19 MED ORDER — ENSURE ENLIVE PO LIQD
237.0000 mL | Freq: Two times a day (BID) | ORAL | Status: DC
  Administered 2015-12-19 – 2015-12-21 (×4): 237 mL via ORAL

## 2015-12-19 NOTE — Progress Notes (Signed)
MD notified of CBC results.

## 2015-12-19 NOTE — Progress Notes (Signed)
Stopped in to evaluate Tommy Page while he was receiving 1 unit of RBCs. He denies dizziness/fatigue, pain controlled, ambulating well. Still expresses anxiety regarding not having a BM yet.   pRBC's hung at ~1:50pm, 30 min ago.  BP at 1342 was 99/53  [name], [facility] Campus[facility] Pager: [phone] Mon-Fri 7:00 am-4:30 pm Sat-Sun 7:00 am-11:30 am

## 2015-12-19 NOTE — Progress Notes (Signed)
CRITICAL VALUE ALERT  Critical value received:  HGB 6.4  Date of notification:  12/19/15  Time of notification:  0934  Critical value read back:Yes.    Nurse who received alert:  Christena DeemHannah Jamontae Thwaites, RN  MD notified (1st page):  Dr. Karie SodaSteven Gross  Time of first page:  [phone]  MD notified (2nd page):  Time of second page:  Responding MD:  Dr. Karie SodaSteven Gross  Time MD responded:  [phone]

## 2015-12-19 NOTE — Progress Notes (Signed)
2 Days Post-Op  Subjective: He is anxious and foley is still in. No bleeding, rectum looks fine.  Foley still in, labs still peniding  Objective: Vital signs in last 24 hours: Temp:  [98.4 F (36.9 C)-98.6 F (37 C)] 98.6 F (37 C) (05/26 0541) Pulse Rate:  [70-89] 83 (05/26 0541) Resp:  [10-17] 16 (05/26 0541) BP: (90-126)/(51-78) 90/51 mmHg (05/26 0541) SpO2:  [97 %-100 %] 98 % (05/26 0541) Last BM Date: 12/17/15 4450 urine Afebrile, VSS Transfused INR 1.26 PTT33 Fibrin is normal, no CBC at this time Intake/Output from previous day: 05/25 0701 - 05/26 0700 In: 800 [P.O.:600; I.V.:200] Out: 4450 [Urine:4450] Intake/Output this shift:    General appearance: alert, cooperative and no distress Resp: clear to auscultation bilaterally GI: soft, non-tender; bowel sounds normal; no masses,  no organomegaly and rectum without any bleeding this AM.  Lab Results:   Recent Labs  12/17/15 0542 12/17/15 1129  WBC 9.3 22.9*  HGB 6.5* 8.7*  HCT 18.5* 24.9*  PLT 226 184    BMET  Recent Labs  12/16/15 2102 12/16/15 2110 12/17/15 0542  NA 135 138 138  K 3.5 3.4* 3.7  CL 104 101 111  CO2 22  --  23  GLUCOSE 115* 107* 100*  BUN 11 9 9   CREATININE 0.89 1.00 0.83  CALCIUM 8.2*  --  7.7*   PT/INR  Recent Labs  12/17/15 1129 12/18/15 0330  LABPROT 18.0* 16.0*  INR 1.48 1.26     Recent Labs Lab 12/16/15 2102  AST 20  ALT 18  ALKPHOS 42  BILITOT 0.4  PROT 6.0*  ALBUMIN 3.4*     Lipase  No results found for: LIPASE   Studies/Results: No results found. Prior to Admission medications   Medication Sig Start Date End Date Taking? Authorizing Provider  docusate sodium (COLACE) 100 MG capsule Take 100 mg by mouth daily as needed for mild constipation.   Yes Historical Provider, MD  hydrocortisone (ANUSOL-HC) 2.5 % rectal cream Place 1 application rectally 2 (two) times daily. Apply around anus for irritated & painful hemorrhoids 12/03/15  Yes Karie SodaSteven Gross, MD   nicotine (NICODERM CQ - DOSED IN MG/24 HOURS) 21 mg/24hr patch Place 21 mg onto the skin daily.   Yes Historical Provider, MD  nicotine polacrilex (NICORETTE) 2 MG gum Take 2 mg by mouth as needed for smoking cessation.   Yes Historical Provider, MD  omeprazole (PRILOSEC OTC) 20 MG tablet Take 20 mg by mouth daily.   Yes Historical Provider, MD  oxyCODONE (OXY IR/ROXICODONE) 5 MG immediate release tablet Take 1-2 tablets (5-10 mg total) by mouth every 4 (four) hours as needed for moderate pain, severe pain or breakthrough pain. 12/03/15  Yes Karie SodaSteven Gross, MD  simvastatin (ZOCOR) 20 MG tablet Take 20 mg by mouth at bedtime.    Yes Historical Provider, MD  Wheat Dextrin (BENEFIBER) POWD Take 10 mLs by mouth 2 (two) times daily.   Yes Historical Provider, MD    Medications: . acetaminophen  1,000 mg Oral TID  . ferrous sulfate  325 mg Oral BID WC  . hydrocortisone  1 application Rectal BID  . lip balm  1 application Topical BID  . nicotine  21 mg Transdermal Daily  . pantoprazole  40 mg Oral Daily  . psyllium  1 packet Oral BID  . simvastatin  20 mg Oral QHS  . sodium chloride flush  3 mL Intravenous Q12H  . vitamin C  500 mg Oral BID  Assessment/Plan Rectal Bleeding s/p THD/ external hemorrhoidectomy x 3 on 12/03/15 S/p EXAM UNDER ANESTHESIA, Flexible sigmoidoscopy, Hemorrhoid ligation 12/17/15, Dr. Michaell Cowing GERD Hx of anxiety and depression Tobacco use FEN:  Soft diet ID:  none DVT:  SCD   Plan:   Mobilize and see how he does.  He say Dr. Michaell Cowing has seen him and foley out later in the day.   Recheck CBC in AM.  LOS: 2 days    Shafer Swamy 12/19/2015 [phone]

## 2015-12-19 NOTE — Progress Notes (Signed)
Bolus LR 1 liter at 0600 for b/p 90/51  asymptomatic

## 2015-12-20 LAB — CBC
HEMATOCRIT: 22.4 % — AB (ref 39.0–52.0)
HEMOGLOBIN: 7.7 g/dL — AB (ref 13.0–17.0)
MCH: 29.7 pg (ref 26.0–34.0)
MCHC: 34.4 g/dL (ref 30.0–36.0)
MCV: 86.5 fL (ref 78.0–100.0)
Platelets: 170 10*3/uL (ref 150–400)
RBC: 2.59 MIL/uL — AB (ref 4.22–5.81)
RDW: 15.9 % — ABNORMAL HIGH (ref 11.5–15.5)
WBC: 8.6 10*3/uL (ref 4.0–10.5)

## 2015-12-20 MED ORDER — MORPHINE SULFATE (PF) 2 MG/ML IV SOLN: 2.0000 mg | INTRAVENOUS | Status: DC | PRN

## 2015-12-20 MED ORDER — ZINC OXIDE 40 % EX OINT
TOPICAL_OINTMENT | Freq: Every day | CUTANEOUS | Status: DC
  Filled 2015-12-20: qty 114

## 2015-12-20 MED ORDER — TRAMADOL HCL 50 MG PO TABS: 50.0000 mg | ORAL_TABLET | Freq: Four times a day (QID) | ORAL | Status: DC | PRN

## 2015-12-20 MED ORDER — TRAMADOL HCL 50 MG PO TABS
50.0000 mg | ORAL_TABLET | Freq: Four times a day (QID) | ORAL | Status: DC | PRN
  Administered 2015-12-20 – 2015-12-21 (×2): 50 mg via ORAL
  Filled 2015-12-20 (×2): qty 1

## 2015-12-20 MED ORDER — BENEFIBER PO POWD
10.0000 mL | Freq: Three times a day (TID) | ORAL | Status: DC
  Administered 2015-12-20: 10 mL via ORAL

## 2015-12-20 MED ORDER — BENEFIBER PO POWD: 10.0000 mL | Freq: Three times a day (TID) | ORAL | Status: DC

## 2015-12-20 MED ORDER — ACETAMINOPHEN 500 MG PO TABS
1000.0000 mg | ORAL_TABLET | Freq: Three times a day (TID) | ORAL | Status: DC
  Administered 2015-12-20 – 2015-12-21 (×4): 1000 mg via ORAL
  Filled 2015-12-20 (×5): qty 2

## 2015-12-20 NOTE — Progress Notes (Signed)
[facility]  [address]  [place], [place] [id number] Phone: [phone] FAX: [phone]   Tommy ParishVincent Page [phone] 17-Mar-1961  CARE TEAM:  PCP: Farris HasMORROW, AARON, MD  Outpatient Care Team: Patient Care Team: Farris HasAaron Morrow, MD as PCP - General (Family Medicine) Karie SodaSteven Myrtle Haller, MD as Consulting Physician (General Surgery) Willis ModenaWilliam Outlaw, MD as Consulting Physician (Gastroenterology)  Inpatient Treatment Team: Treatment Team: Attending Provider: Karie SodaSteven Andrw Mcguirt, MD; Rounding Team: Bishop LimboMd Ccs, MD; Registered Nurse: Mardene CelesteEsther O Edgal, RN; Registered Nurse: Levora AngelHannah Yount V, RN; Technician: Harriette OharaYolanda M Totten, NT; Technician: Ronni Rumblemeia C Bullock, NT; Registered Nurse: Rondel JumboNakiyha S Dumas, RN; Technician: Sherron MondayShannon R Kelly, NT; Registered Nurse: Patrina LeveringVera B Williams, RN  Problem List:   Principal Problem:   Rectal bleeding s/p rectal ulcer ligation 12/17/2015 Active Problems:   Prolapsed internal hemorrhoids, grade 4 s/p THD ligation & hemorrhoidectomy 12/03/2015    Symptomatic anemia   Vasovagal responses   Acute urinary retention   Anxiety   GERD (gastroesophageal reflux disease)   HOH (hard of hearing)   3 Days Post-Op  12/17/2015  POST-OPERATIVE DIAGNOSIS: Rectal bleeding from internal hemorrhoidectomy wound  PROCEDURE:  EXAM UNDER ANESTHESIA Flexible sigmoidoscopy Hemorrhoid ligation  SURGEON: Surgeon(s): Karie SodaSteven Anquinette Pierro, MD   Assessment  Stabilizing  Plan:  -try to wean off IV pain meds.  Try tramadol (oxyIR too sedating/constipating?) -bowel regimen -hold anticoagulation -try to d/c foley.  If fails, replace & f/u urology next week.  Cannot tolerate flomax/alpha blocker with vasovagal/anemia -adv diet -VTE prophylaxis- SCDs, etc -mobilize as tolerated to help recovery  I updated the patient's status to the patient and nurse.  Recommendations were made.  Questions were answered.  They expressed understanding & appreciation.   [name],  M.D., F.A.C.S. Gastrointestinal and Minimally Invasive [facility] [address] [place], [place] [address] [phone] Main / Paging   12/20/2015  Subjective:  Feels much better after 2nd unit Not dizzy - walking more easily Using IV narcotics only - hesistant to try PO only but feels more ready now Wants to try & get Foley out today  Objective:  Vital signs:  Filed Vitals:   12/19/15 1630 12/19/15 1808 12/19/15 2138 12/20/15 0622  BP: 94/56 98/61 100/62 104/65  Pulse:  73 81 62  Temp: 99.1 F (37.3 C) 98.2 F (36.8 C) 99.6 F (37.6 C) 98.6 F (37 C)  TempSrc: Oral Oral Oral Oral  Resp:  18 18 18   Height:      Weight:      SpO2:  98% 99% 98%    Last BM Date: 12/19/15 (two small smears )  Intake/Output   Yesterday:  05/26 0701 - 05/27 0700 In: 575 [P.O.:240; Blood:335] Out: 4225 [Urine:4225] This shift:  Total I/O In: 240 [P.O.:240] Out: -   Bowel function:  Flatus: YES  BM:  Small  Drain: (No drain)   Physical Exam:  General: Pt awake/alert/oriented x4 in No acute distress Eyes: PERRL, normal EOM.  Sclera clear.  No icterus Neuro: CN II-XII intact w/o focal sensory/motor deficits. Lymph: No head/neck/groin lymphadenopathy Psych:  No delerium/psychosis/paranoia HENT: Normocephalic, Mucus membranes moist.  No thrush Neck: Supple, No tracheal deviation Chest: No chest wall pain w good excursion CV:  Pulses intact.  Regular rhythm MS: Normal AROM mjr joints.  No obvious deformity Abdomen: Soft.  Nondistended.  Nontender.  No evidence of peritonitis.  No incarcerated hernias. Ext:  SCDs BLE.  No mjr edema.  No cyanosis Skin:  No petechiae / purpura  Results:   Labs: Results for orders placed or performed during the hospital encounter of 12/16/15 (from the past 48 hour(s))  Hemoglobin     Status: Abnormal   Collection Time: 12/19/15  8:54 AM  Result Value Ref Range   Hemoglobin 6.4 (LL) 13.0 - 17.0  g/dL    Comment: CRITICAL RESULT CALLED TO, READ BACK BY AND VERIFIED WITH:  YOUNT,K. RN  ON 5.26.17 BY MCCOY,N.    Prepare RBC     Status: None   Collection Time: 12/19/15 10:00 AM  Result Value Ref Range   Order Confirmation ORDER PROCESSED BY BLOOD BANK   CBC     Status: Abnormal   Collection Time: 12/19/15  6:08 PM  Result Value Ref Range   WBC 9.7 4.0 - 10.5 K/uL   RBC 2.56 (L) 4.22 - 5.81 MIL/uL   Hemoglobin 7.4 (L) 13.0 - 17.0 g/dL   HCT 16.1 (L) 09.6 - 04.5 %   MCV 83.6 78.0 - 100.0 fL   MCH 28.9 26.0 - 34.0 pg   MCHC 34.6 30.0 - 36.0 g/dL   RDW 40.9 81.1 - 91.4 %   Platelets 176 150 - 400 K/uL  CBC     Status: Abnormal   Collection Time: 12/20/15  5:38 AM  Result Value Ref Range   WBC 8.6 4.0 - 10.5 K/uL   RBC 2.59 (L) 4.22 - 5.81 MIL/uL   Hemoglobin 7.7 (L) 13.0 - 17.0 g/dL   HCT 78.2 (L) 95.6 - 21.3 %   MCV 86.5 78.0 - 100.0 fL   MCH 29.7 26.0 - 34.0 pg   MCHC 34.4 30.0 - 36.0 g/dL   RDW 08.6 (H) 57.8 - 46.9 %   Platelets 170 150 - 400 K/uL    Imaging / Studies: No results found.  Medications / Allergies: per chart  Antibiotics: Anti-infectives    Start     Dose/Rate Route Frequency Ordered Stop   12/17/15 1030  ceFAZolin (ANCEF) IVPB 2g/100 mL premix    Comments:  Pharmacy may adjust dosing strength, interval, or rate of medication as needed for optimal therapy for the patient  Send with patient on call to the OR.  Anesthesia to complete antibiotic administration <46min prior to incision per [facility].   2 g 200 mL/hr over 30 Minutes Intravenous On call to O.R. 12/17/15 1025 12/17/15 1049   12/17/15 1030  metroNIDAZOLE (FLAGYL) IVPB 500 mg    Comments:  Pharmacy may adjust dosing strength, interval, or rate of medication as needed for optimal therapy for the patient Send with patient on call to the OR.  Anesthesia to complete antibiotic administration <54min prior to incision per [facility].   500 mg 100 mL/hr over 60 Minutes Intravenous On  call to O.R. 12/17/15 1025 12/17/15 1104        Note: Portions of this report may have been transcribed using voice recognition software. Every effort was made to ensure accuracy; however, inadvertent computerized transcription errors may be present.   Any transcriptional errors that result from this process are unintentional.     Ardeth Sportsman, M.D., F.A.C.S. Gastrointestinal and Minimally Invasive Surgery [facility] [address] [place], [place] [address] [phone] Main / Paging   12/20/2015

## 2015-12-21 NOTE — Progress Notes (Signed)
Pt discharged to home. DC instructions given. Prescription x 1 given. No concerns voiced. Left unit in wheelchair pushed by nurse tech accompanied by mother. Left in good and stable condition. VWilliams, rn.

## 2015-12-21 NOTE — Progress Notes (Signed)
This shift pt started on PO tramadol for pain. Ambulated the entire length of hall with nurse tech with no difficulty. Will continue to monitor pt.

## 2015-12-21 NOTE — Discharge Summary (Signed)
Physician Discharge Summary  Patient ID: Tommy Page MRN: [id number] DOB/AGE: 1961-01-02 55 y.o.  Admit date: 12/16/2015 Discharge date: 12/21/2015  Patient Care Team: Farris Has, MD as PCP - General (Family Medicine) Karie Soda, MD as Consulting Physician (General Surgery) Willis Modena, MD as Consulting Physician (Gastroenterology)  Admission Diagnoses: Principal Problem:   Rectal bleeding s/p rectal ulcer ligation 12/17/2015 Active Problems:   Prolapsed internal hemorrhoids, grade 4 s/p THD ligation & hemorrhoidectomy 12/03/2015    Symptomatic anemia   Vasovagal responses   Acute urinary retention   Anxiety   GERD (gastroesophageal reflux disease)   HOH (hard of hearing)   Discharge Diagnoses:  Principal Problem:   Rectal bleeding s/p rectal ulcer ligation 12/17/2015 Active Problems:   Prolapsed internal hemorrhoids, grade 4 s/p THD ligation & hemorrhoidectomy 12/03/2015    Symptomatic anemia   Vasovagal responses   Acute urinary retention   Anxiety   GERD (gastroesophageal reflux disease)   HOH (hard of hearing)   POST-OPERATIVE DIAGNOSIS:   rectal bleeding  SURGERY:  12/17/2015  Procedure(s): EXAM UNDER ANESTHESIA, flexible sigmoidoscopy hemmorhoid ligation  SURGEON:    Surgeon(s): Karie Soda, MD  Consults: None  Hospital Course:    Patient status post hemorrhoidal ligation two weeks ago.  Struggled with many issues.  Initially urinary retention.  That resolved.  One or two episodes of moderate rectal bleeding that resolved.  Was actually starting to feel better.  No bleeding.  No pain.  Then had an intense episode of rectal bleeding.  Went to the emergency room.  Found to be anemic and orthostatic.  Admitted.  No bleeding for the first 12 hours.  Anemic but feeling fine.  Then had sudden major rectal bleed bleed.  Hypotensive.  Taken to the operative room emerrgently.  Hemorrhoid arterial bleeder at right anterior hemorrhoid ligation site.   Postoperatively, the patient gradually mobilized and advanced to a solid diet.  Pain and other symptoms were treated aggressively.    By the time of discharge, the patient was walking well the hallways, eating food, having flatus.  Pain was well-controlled on an oral medications.  Based on meeting discharge criteria and continuing to recover, I felt it was safe for the patient to be discharged from the hospital to further recover with close followup. Postoperative recommendations were discussed in detail.  Discussed with his mother at the bedside as well.  He feels comfortable going home today.  No longer orthostatic.  Anemic but hemoglobin stable.  They are written as well.   Significant Diagnostic Studies:  Results for orders placed or performed during the hospital encounter of 12/16/15 (from the past 72 hour(s))  Hemoglobin     Status: Abnormal   Collection Time: 12/19/15  8:54 AM  Result Value Ref Range   Hemoglobin 6.4 (LL) 13.0 - 17.0 g/dL    Comment: CRITICAL RESULT CALLED TO, READ BACK BY AND VERIFIED WITH:  YOUNT,K. RN @0935  ON 5.26.17 BY MCCOY,N.    Prepare RBC     Status: None   Collection Time: 12/19/15 10:00 AM  Result Value Ref Range   Order Confirmation ORDER PROCESSED BY BLOOD BANK   CBC     Status: Abnormal   Collection Time: 12/19/15  6:08 PM  Result Value Ref Range   WBC 9.7 4.0 - 10.5 K/uL   RBC 2.56 (L) 4.22 - 5.81 MIL/uL   Hemoglobin 7.4 (L) 13.0 - 17.0 g/dL   HCT 40.9 (L) 81.1 - 91.4 %   MCV 83.6 78.0 -  100.0 fL   MCH 28.9 26.0 - 34.0 pg   MCHC 34.6 30.0 - 36.0 g/dL   RDW 08.6 57.8 - 46.9 %   Platelets 176 150 - 400 K/uL  CBC     Status: Abnormal   Collection Time: 12/20/15  5:38 AM  Result Value Ref Range   WBC 8.6 4.0 - 10.5 K/uL   RBC 2.59 (L) 4.22 - 5.81 MIL/uL   Hemoglobin 7.7 (L) 13.0 - 17.0 g/dL   HCT 62.9 (L) 52.8 - 41.3 %   MCV 86.5 78.0 - 100.0 fL   MCH 29.7 26.0 - 34.0 pg   MCHC 34.4 30.0 - 36.0 g/dL   RDW 24.4 (H) 01.0 - 27.2 %   Platelets  170 150 - 400 K/uL    No results found.  Discharge Exam: Blood pressure 102/65, pulse 65, temperature 98.3 F (36.8 C), temperature source Oral, resp. rate 17, height 5\' 10"  (1.778 m), weight 78.9 kg (173 lb 15.1 oz), SpO2 100 %.  General: Pt awake/alert/oriented x4 in no major acute distress Eyes: PERRL, normal EOM. Sclera nonicteric Neuro: CN II-XII intact w/o focal sensory/motor deficits. Lymph: No head/neck/groin lymphadenopathy Psych:  No delerium/psychosis/paranoia HENT: Normocephalic, Mucus membranes moist.  No thrush Neck: Supple, No tracheal deviation Chest: No pain.  Good respiratory excursion. CV:  Pulses intact.  Regular rhythm MS: Normal AROM mjr joints.  No obvious deformity Abdomen: Soft, Nondistended.  Nontender.  No incarcerated hernias. Ext:  SCDs BLE.  No significant edema.  No cyanosis Skin: No petechiae / purpura  Discharged Condition: fair but improved   Past Medical History  Diagnosis Date  . Anxiety   . Depression   . GERD (gastroesophageal reflux disease)   . HOH (hard of hearing)     BOTH EARS  . Hemorrhoids     Past Surgical History  Procedure Laterality Date  . Basal cell carcinoma removed  2013    LEFT TEMPLE  . Nasal septum repair  2005  . Lasix eye surgery  2005  . Transanal hemorrhoidal dearterialization N/A 12/03/2015    Procedure: TRANSANAL HEMORRHOIDAL DEARTERIALIZATION LIGATION/PEXY  EUA HEMORRHOIDECTOMY ;  Surgeon: Karie Soda, MD;  Location: [facility];  Service: General;  Laterality: N/A;    Social History   Social History  . Marital Status: Single    Spouse Name: N/A  . Number of Children: N/A  . Years of Education: N/A   Occupational History  . Not on file.   Social History Main Topics  . Smoking status: Former Smoker    Quit date: 06/26/2015  . Smokeless tobacco: Never Used  . Alcohol Use: Yes     Comment: OCCASIONAL  . Drug Use: No  . Sexual Activity: Not on file   Other Topics Concern  . Not on file   Social  History Narrative    History reviewed. No pertinent family history.  Current Facility-Administered Medications  Medication Dose Route Frequency Provider Last Rate Last Dose  . acetaminophen (TYLENOL) tablet 1,000 mg  1,000 mg Oral TID WC & HS Karie Soda, MD   1,000 mg at 12/21/15 0810  . alum & mag hydroxide-simeth (MAALOX/MYLANTA) 200-200-20 MG/5ML suspension 30 mL  30 mL Oral Q6H PRN Karie Soda, MD      . BENEFIBER POWD 10 mL  10 mL Oral TID Karie Soda, MD   10 mL at 12/20/15 2249  . chlorproMAZINE (THORAZINE) 25 mg in sodium chloride 0.9 % 25 mL IVPB  25 mg Intravenous Q6H PRN Viviann Spare  Christine Schiefelbein, MD      . diphenhydrAMINE (BENADRYL) capsule 25 mg  25 mg Oral Q6H PRN Manus Rudd, MD       Or  . diphenhydrAMINE (BENADRYL) injection 25 mg  25 mg Intravenous Q6H PRN Manus Rudd, MD      . feeding supplement (ENSURE ENLIVE) (ENSURE ENLIVE) liquid 237 mL  237 mL Oral BID BM Karie Soda, MD   237 mL at 12/20/15 1000  . ferrous sulfate tablet 325 mg  325 mg Oral BID WC Karie Soda, MD   325 mg at 12/20/15 1738  . guaiFENesin-dextromethorphan (ROBITUSSIN DM) 100-10 MG/5ML syrup 15 mL  15 mL Oral Q4H PRN Karie Soda, MD      . hydrocortisone-pramoxine [facility]) 2.5-1 % rectal cream 1 application  1 application Rectal Q6H PRN Karie Soda, MD      . lip balm (CARMEX) ointment 1 application  1 application Topical BID Karie Soda, MD   1 application at 12/20/15 2200  . liver oil-zinc oxide (DESITIN) 40 % ointment   Topical Daily Karie Soda, MD      . LORazepam (ATIVAN) injection 0.5-1 mg  0.5-1 mg Intravenous Q8H PRN Karie Soda, MD      . magic mouthwash  15 mL Oral QID PRN Karie Soda, MD      . menthol-cetylpyridinium (CEPACOL) lozenge 3 mg  1 lozenge Oral PRN Karie Soda, MD      . methocarbamol (ROBAXIN) tablet 500 mg  500 mg Oral Q6H PRN Manus Rudd, MD      . morphine 2 MG/ML injection 2-4 mg  2-4 mg Intravenous Q4H PRN Karie Soda, MD      . nicotine (NICODERM CQ - dosed in  mg/24 hours) patch 21 mg  21 mg Transdermal Daily Manus Rudd, MD   21 mg at 12/21/15 1610  . nicotine polacrilex (NICORETTE) gum 2 mg  2 mg Oral PRN Karie Soda, MD      . ondansetron (ZOFRAN-ODT) disintegrating tablet 4 mg  4 mg Oral Q6H PRN Manus Rudd, MD       Or  . ondansetron [facility]) injection 4 mg  4 mg Intravenous Q6H PRN Manus Rudd, MD      . pantoprazole (PROTONIX) EC tablet 40 mg  40 mg Oral Daily Manus Rudd, MD   40 mg at 12/20/15 0925  . phenol (CHLORASEPTIC) mouth spray 2 spray  2 spray Mouth/Throat PRN Karie Soda, MD      . simvastatin (ZOCOR) tablet 20 mg  20 mg Oral QHS Manus Rudd, MD   20 mg at 12/20/15 2248  . sodium chloride flush (NS) 0.9 % injection 3 mL  3 mL Intravenous Q12H Karie Soda, MD   3 mL at 12/20/15 2248  . traMADol (ULTRAM) tablet 50-100 mg  50-100 mg Oral Q6H PRN Karie Soda, MD   50 mg at 12/21/15 9604  . vitamin C (ASCORBIC ACID) tablet 500 mg  500 mg Oral BID Karie Soda, MD   500 mg at 12/20/15 2248     Allergies  Allergen Reactions  . Nsaids Other (See Comments)    bleeding  . Zoloft [Sertraline Hcl] Nausea And Vomiting    Agitation    Disposition: 01-Home or Self Care  Discharge Instructions    Call MD for:  extreme fatigue    Complete by:  As directed      Call MD for:  extreme fatigue    Complete by:  As directed      Call MD  for:  hives    Complete by:  As directed      Call MD for:  hives    Complete by:  As directed      Call MD for:  persistant nausea and vomiting    Complete by:  As directed      Call MD for:  persistant nausea and vomiting    Complete by:  As directed      Call MD for:  redness, tenderness, or signs of infection (pain, swelling, redness, odor or green/yellow discharge around incision site)    Complete by:  As directed      Call MD for:  redness, tenderness, or signs of infection (pain, swelling, redness, odor or green/yellow discharge around incision site)    Complete by:  As directed       Call MD for:  severe uncontrolled pain    Complete by:  As directed      Call MD for:  severe uncontrolled pain    Complete by:  As directed      Call MD for:    Complete by:  As directed   Temperature > 101.116F     Call MD for:    Complete by:  As directed   Temperature > 101.116F     Diet - low sodium heart healthy    Complete by:  As directed   Start with bland, low residue diet for a few days, then advance to a heart healthy (low fat, high fiber) diet.  If you feel nauseated or constipated, simplify to a liquid only diet for 48 hours until you are feeling better (no more nausea, farting/passing gas, having a bowel movement, etc...).  If you cannot tolerate even drinking liquids, or feeling worse, let your surgeon know or go to the Emergency Department for help.     Discharge instructions    Complete by:  As directed   Please see discharge instruction sheets.   Also refer to any handouts/printouts that may have been given from the [facility] surgery office (if you visited [place] there before surgery) Please call our office if you have any questions or concerns [phone]     Discharge instructions    Complete by:  As directed   Please see discharge instruction sheets.   Also refer to any handouts/printouts that may have been given from the [facility] surgery office (if you visited [place] there before surgery) Please call our office if you have any questions or concerns [phone]     Discharge wound care:    Complete by:  As directed   If you have closed incisions: Shower and bathe over these incisions with soap and water every day.  It is OK to wash over the dressings: they are waterproof. Remove all surgical dressings on postoperative day #3.  You do not need to replace dressings over the closed incisions unless you feel more comfortable with a Band-Aid covering it.   If you have an open wound: That requires packing, so please see wound care instructions.   In general, remove all dressings, wash  wound with soap and water and then replace with saline moistened gauze.  Do the dressing change at least every day.    Please call our office [phone] if you have further questions.     Discharge wound care:    Complete by:  As directed   If you have closed incisions: Shower and bathe over these incisions with soap and water every day.  It is OK to wash over the dressings: they are waterproof. Remove all surgical dressings on postoperative day #3.  You do not need to replace dressings over the closed incisions unless you feel more comfortable with a Band-Aid covering it.   If you have an open wound: That requires packing, so please see wound care instructions.   In general, remove all dressings, wash wound with soap and water and then replace with saline moistened gauze.  Do the dressing change at least every day.    Please call our office [phone] if you have further questions.     Driving Restrictions    Complete by:  As directed   No driving until off narcotics and can safely swerve away without pain during an emergency     Driving Restrictions    Complete by:  As directed   No driving until off narcotics and can safely swerve away without pain during an emergency     Increase activity slowly    Complete by:  As directed   Walk an hour a day.  Use 20-30 minute walks.  When you can walk 30 minutes without difficulty, it is fine to restart low impact/moderate activities such as biking, jogging, swimming, sexual activity, etc.  Eventually you can increase to unrestricted activity when not feeling pain.  If you feel pain: STOP!Marland Kitchen   Let pain protect you from overdoing it.  Use ice/heat & over-the-counter pain medications to help minimize soreness.  If that is not enough, then use your narcotic pain prescription as needed to remain active.  It is better to take extra pain medications and be more active than to stay bedridden to avoid all pain medications.     Increase activity slowly     Complete by:  As directed   Walk an hour a day.  Use 20-30 minute walks.  When you can walk 30 minutes without difficulty, it is fine to restart low impact/moderate activities such as biking, jogging, swimming, sexual activity, etc.  Eventually you can increase to unrestricted activity when not feeling pain.  If you feel pain: STOP!Marland Kitchen   Let pain protect you from overdoing it.  Use ice/heat & over-the-counter pain medications to help minimize soreness.  If that is not enough, then use your narcotic pain prescription as needed to remain active.  It is better to take extra pain medications and be more active than to stay bedridden to avoid all pain medications.     Lifting restrictions    Complete by:  As directed   Avoid heavy lifting initially, <20 pounds at first.   Do not push through pain.   You have no specific weight limit: If it hurts to do, DON'T DO IT.    If you feel no pain, you are not injuring anything.  Pain will protect you from injury.   Coughing and sneezing are far more stressful to your incision than any lifting.   Avoid resuming heavy lifting (>50 pounds) or other intense activity until off all narcotic pain medications.   When want to exercise more, give yourself 2 weeks to gradually get back to full intense exercise/activity.     Lifting restrictions    Complete by:  As directed   Avoid heavy lifting initially, <20 pounds at first.   Do not push through pain.   You have no specific weight limit: If it hurts to do, DON'T DO IT.    If you feel no pain, you are not injuring anything.  Pain will protect you from injury.   Coughing and sneezing are far more stressful to your incision than any lifting.   Avoid resuming heavy lifting (>50 pounds) or other intense activity until off all narcotic pain medications.   When want to exercise more, give yourself 2 weeks to gradually get back to full intense exercise/activity.     May shower / Bathe    Complete by:  As directed   SHOWER  EVERY DAY.  It is fine for dressings or wounds to be washed/rinsed.  Use gentle soap & water.  This will help the incisions and/or wounds get clean & minimize infection.     May shower / Bathe    Complete by:  As directed   SHOWER EVERY DAY.  It is fine for dressings or wounds to be washed/rinsed.  Use gentle soap & water.  This will help the incisions and/or wounds get clean & minimize infection.     May walk up steps    Complete by:  As directed      May walk up steps    Complete by:  As directed      Sexual Activity Restrictions    Complete by:  As directed   Sexual activity as tolerated.  Do not push through pain.  Pain will protect you from injury.     Sexual Activity Restrictions    Complete by:  As directed   Sexual activity as tolerated.  Do not push through pain.  Pain will protect you from injury.     Walk with assistance    Complete by:  As directed   Walk over an hour a day.  May use a walker/cane/companion to help with balance and stamina.     Walk with assistance    Complete by:  As directed   Walk over an hour a day.  May use a walker/cane/companion to help with balance and stamina.            Medication List    STOP taking these medications        docusate sodium 100 MG capsule  Commonly known as:  COLACE     oxyCODONE 5 MG immediate release tablet  Commonly known as:  Oxy IR/ROXICODONE      TAKE these medications        BENEFIBER Powd  Take 10 mLs by mouth 2 (two) times daily.     hydrocortisone 2.5 % rectal cream  Commonly known as:  ANUSOL-HC  Place 1 application rectally 2 (two) times daily. Apply around anus for irritated & painful hemorrhoids     nicotine 21 mg/24hr patch  Commonly known as:  NICODERM CQ - dosed in mg/24 hours  Place 21 mg onto the skin daily.     nicotine polacrilex 2 MG gum  Commonly known as:  NICORETTE  Take 2 mg by mouth as needed for smoking cessation.     omeprazole 20 MG tablet  Commonly known as:  PRILOSEC OTC   Take 20 mg by mouth daily.     simvastatin 20 MG tablet  Commonly known as:  ZOCOR  Take 20 mg by mouth at bedtime.     traMADol 50 MG tablet  Commonly known as:  ULTRAM  Take 1-2 tablets (50-100 mg total) by mouth every 6 (six) hours as needed for moderate pain or severe pain.          Signed: Lorenso Courier, M.D., F.A.C.S. Gastrointestinal and Minimally  Invasive Surgery [facility] [address] [place], [place] [id number] [phone] Main / Paging   12/21/2015, 8:40 AM

## 2015-12-22 LAB — TYPE AND SCREEN
ABO/RH(D): O POS
Antibody Screen: NEGATIVE
UNIT DIVISION: 0
UNIT DIVISION: 0
UNIT DIVISION: 0
Unit division: 0
Unit division: 0
Unit division: 0

## 2017-05-20 IMAGING — CT CT HEAD W/O CM
3 of 6 series · 14 of 47 positions shown, 17 images · non-contrast
Comparison: None

CLINICAL DATA: Two syncopal episodes in the past 24 hours, hematoma
is at back of head and LEFT supraorbital, alert and oriented, neck
pain on LEFT radiating to LEFT shoulder ; hemorrhoid surgery 1 week
ago with rectal bleeding since

EXAM:
CT HEAD WITHOUT CONTRAST
CT CERVICAL SPINE WITHOUT CONTRAST
TECHNIQUE: Multidetector CT imaging of the head and cervical spine was
performed following the standard protocol without intravenous
contrast. Multiplanar CT image reconstructions of the cervical spine
were also generated.

[Series 4: bone windows · axial · 0.42mm/px · z∈[+1410,+1449]mm · 2 of 51 slices shown]
[im 13/51  bone]
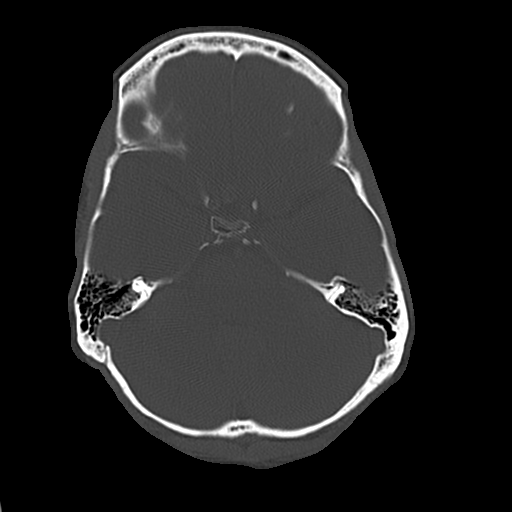
[im 26/51  bone]
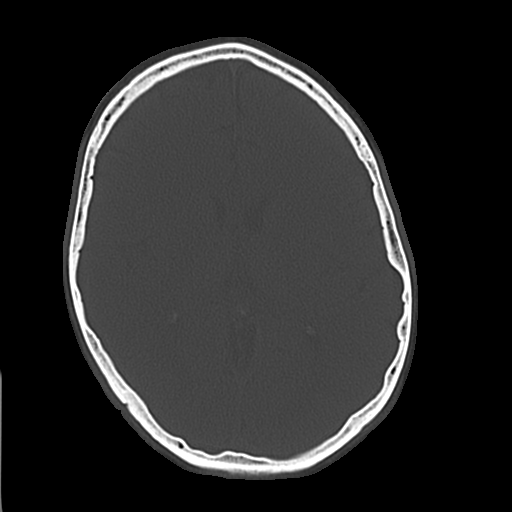

[Series 8: axial reformats · axial · 0.21mm/px · z∈[+1192,+1362]mm · 9 of 111 slices shown, 12 images]
[im 12/111  brain]
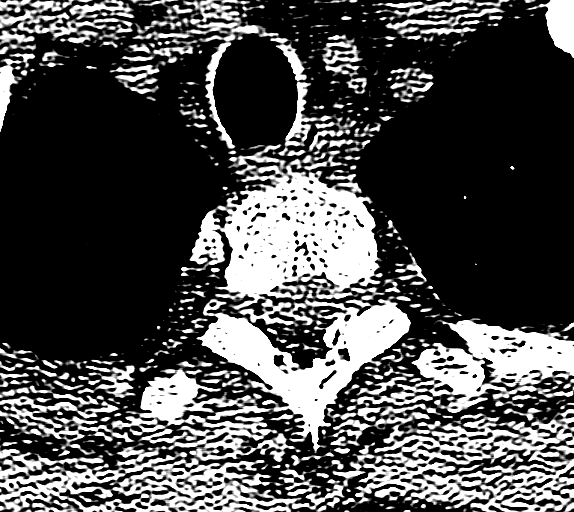
[im 12/111  bone]
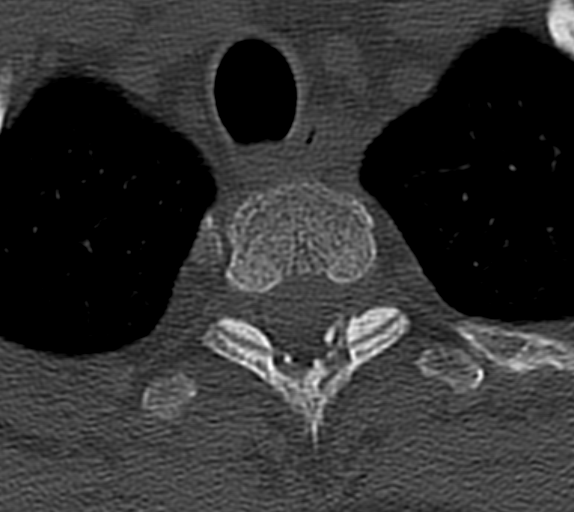
[im 23/111  brain]
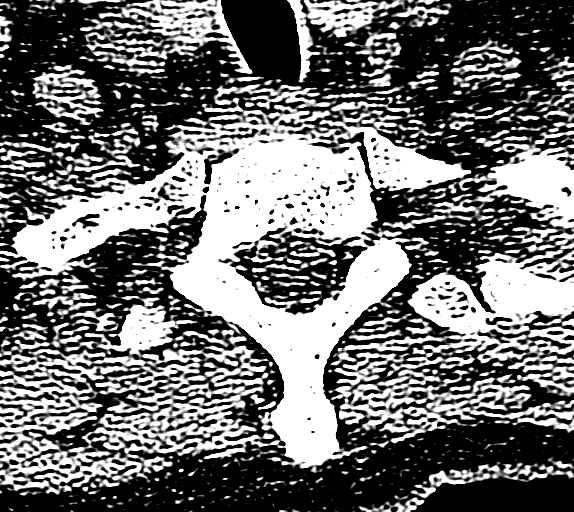
[im 34/111  brain]
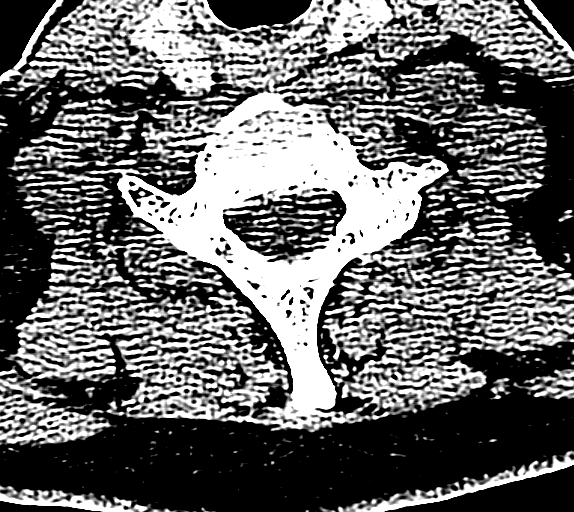
[im 45/111  brain]
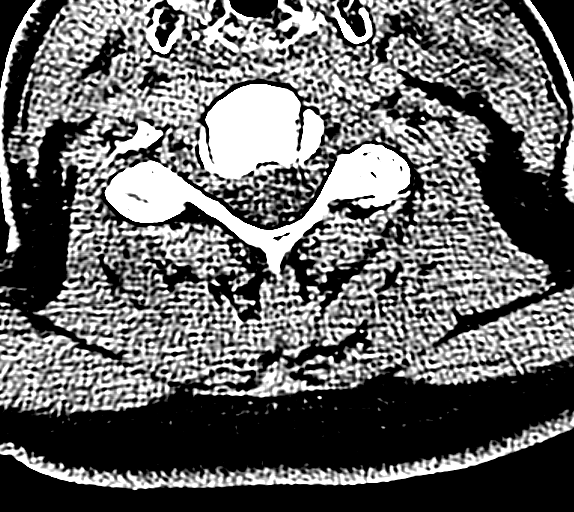
[im 56/111  brain]
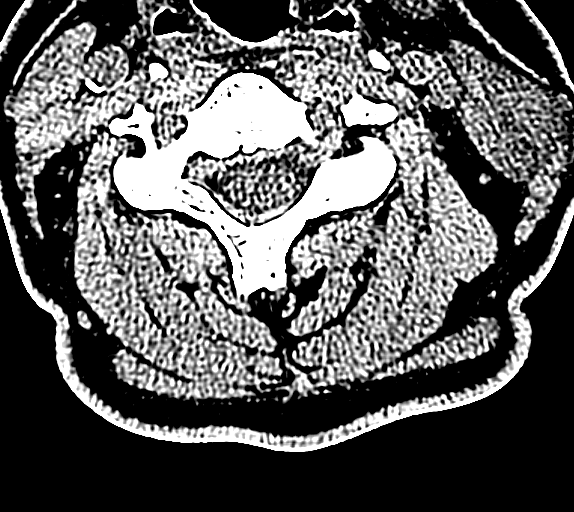
[im 56/111  bone]
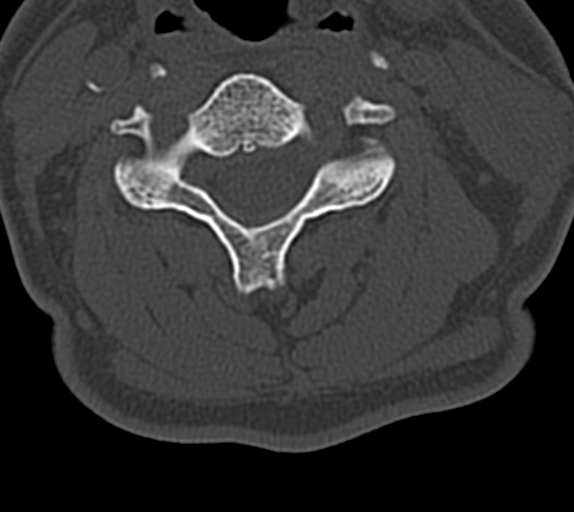
[im 67/111  brain]
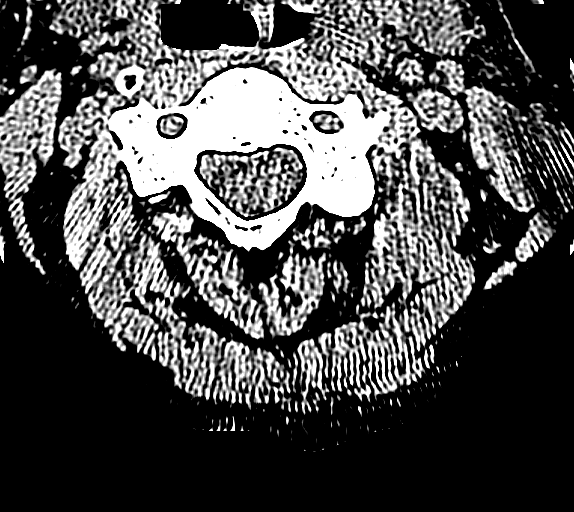
[im 78/111  brain]
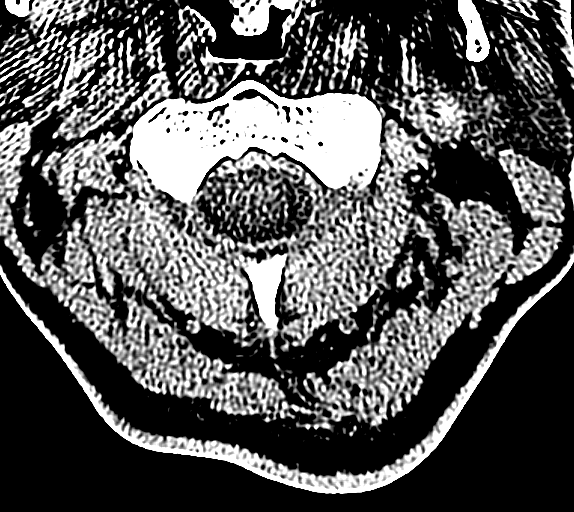
[im 89/111  brain]
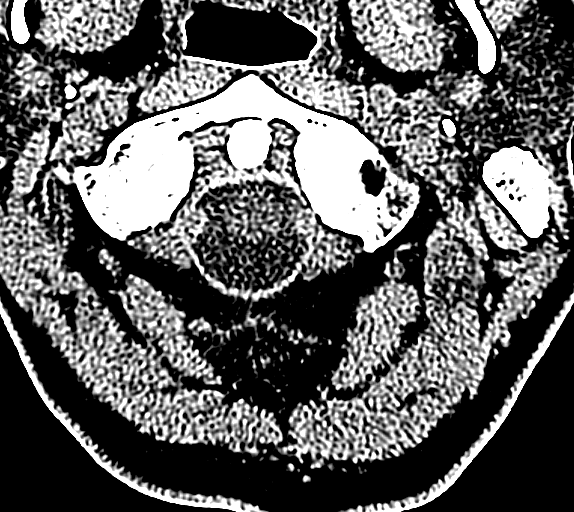
[im 100/111  brain]
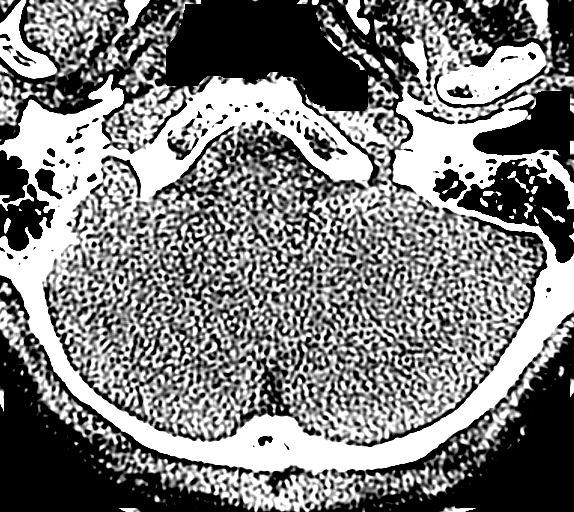
[im 100/111  bone]
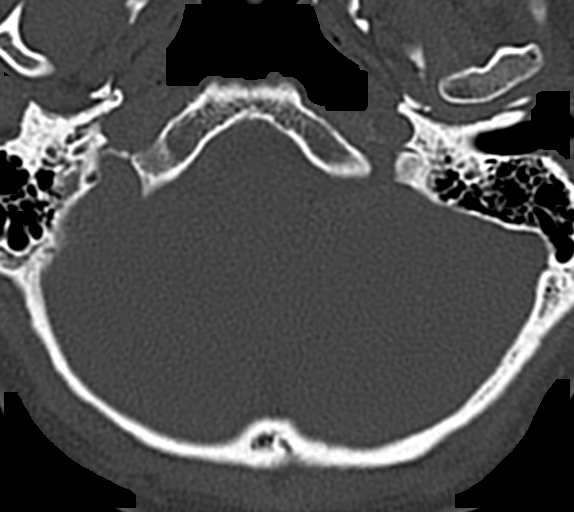

[Series 9: coronal recons · coronal · 0.33mm/px · 3 of 46 slices shown]
[im 16/46  brain]
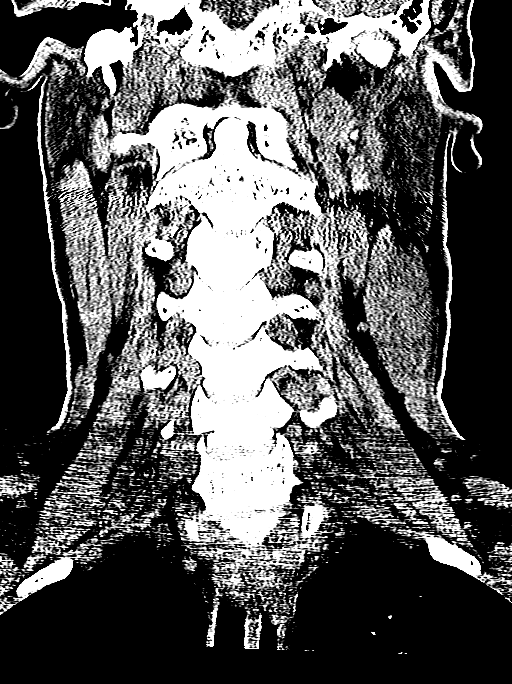
[im 21/46  brain]
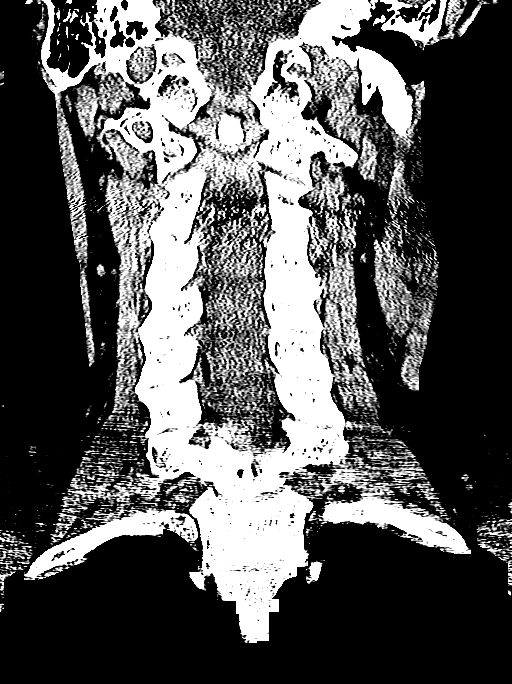
[im 26/46  brain]
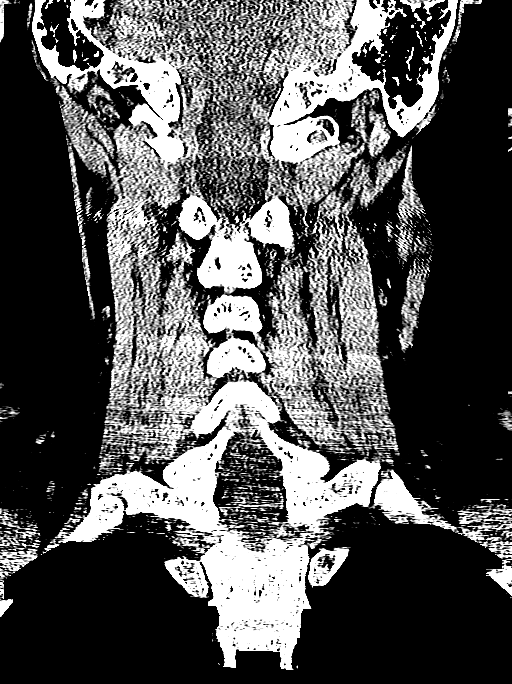

[14 of 47 positions shown; findings below may reference images not displayed]

FINDINGS: CT HEAD FINDINGS

Normal ventricular morphology.

No midline shift or mass effect.

Normal appearance of brain parenchyma.

No intracranial hemorrhage, mass lesion, or evidence acute
infarction.

No extra-axial fluid collections.

Dependent mucus within sphenoid sinus.

Tiny mucosal retention cyst RIGHT maxillary sinus.

Remaining paranasal sinuses and mastoid air cells clear.

Osseous structures appear intact.

CT CERVICAL SPINE FINDINGS

Prevertebral soft tissues normal thickness.

Osseous mineralization grossly normal.

Vertebral body and disc space heights maintained.

No acute fracture, subluxation, or bone destruction.

Visualized skullbase intact.

Lung apices clear.
IMPRESSION: No acute intracranial abnormalities.

No acute cervical spine abnormalities.

## 2017-11-21 DIAGNOSIS — D225 Melanocytic nevi of trunk: Secondary | ICD-10-CM | POA: Diagnosis not present

## 2017-11-21 DIAGNOSIS — D2261 Melanocytic nevi of right upper limb, including shoulder: Secondary | ICD-10-CM | POA: Diagnosis not present

## 2017-11-21 DIAGNOSIS — D2262 Melanocytic nevi of left upper limb, including shoulder: Secondary | ICD-10-CM | POA: Diagnosis not present

## 2017-11-21 DIAGNOSIS — C44321 Squamous cell carcinoma of skin of nose: Secondary | ICD-10-CM | POA: Diagnosis not present

## 2017-12-06 DIAGNOSIS — E785 Hyperlipidemia, unspecified: Secondary | ICD-10-CM | POA: Diagnosis not present

## 2017-12-06 DIAGNOSIS — R7301 Impaired fasting glucose: Secondary | ICD-10-CM | POA: Diagnosis not present

## 2017-12-06 DIAGNOSIS — F418 Other specified anxiety disorders: Secondary | ICD-10-CM | POA: Diagnosis not present

## 2017-12-12 DIAGNOSIS — C44311 Basal cell carcinoma of skin of nose: Secondary | ICD-10-CM | POA: Diagnosis not present

## 2018-09-13 DIAGNOSIS — Z125 Encounter for screening for malignant neoplasm of prostate: Secondary | ICD-10-CM | POA: Diagnosis not present

## 2018-09-13 DIAGNOSIS — E785 Hyperlipidemia, unspecified: Secondary | ICD-10-CM | POA: Diagnosis not present

## 2018-09-13 DIAGNOSIS — Z0000 Encounter for general adult medical examination without abnormal findings: Secondary | ICD-10-CM | POA: Diagnosis not present

## 2018-10-02 DIAGNOSIS — J0100 Acute maxillary sinusitis, unspecified: Secondary | ICD-10-CM | POA: Diagnosis not present

## 2022-02-01 ENCOUNTER — Other Ambulatory Visit: Payer: Self-pay

## 2022-02-01 DIAGNOSIS — F17210 Nicotine dependence, cigarettes, uncomplicated: Secondary | ICD-10-CM

## 2022-02-01 DIAGNOSIS — Z122 Encounter for screening for malignant neoplasm of respiratory organs: Secondary | ICD-10-CM

## 2022-02-01 DIAGNOSIS — Z87891 Personal history of nicotine dependence: Secondary | ICD-10-CM

## 2022-03-11 ENCOUNTER — Encounter: Payer: Self-pay | Admitting: Acute Care

## 2022-03-11 ENCOUNTER — Ambulatory Visit (INDEPENDENT_AMBULATORY_CARE_PROVIDER_SITE_OTHER): Payer: Self-pay | Admitting: Acute Care

## 2022-03-11 DIAGNOSIS — F17210 Nicotine dependence, cigarettes, uncomplicated: Secondary | ICD-10-CM

## 2022-03-11 NOTE — Progress Notes (Signed)
Virtual Visit via Telephone Note  I connected with Tommy Page on 06/09/21 at  2:00 PM EST by telephone and verified that I am speaking with the correct person using two identifiers.  Location: Patient: Home Provider: Working from home    I discussed the limitations, risks, security and privacy concerns of performing an evaluation and management service by telephone and the availability of in person appointments. I also discussed with the patient that there may be a patient responsible charge related to this service. The patient expressed understanding and agreed to proceed.  Shared Decision Making Visit Lung Cancer Screening Program [phone])   Eligibility: Age 61 y.o. Pack Years Smoking History Calculation 36 (# packs/per year x # years smoked) Recent History of coughing up blood  no Unexplained weight loss? no ( >Than 15 pounds within the last 6 months ) Prior History Lung / other cancer no (Diagnosis within the last 5 years already requiring surveillance chest CT Scans). Smoking Status Current Smoker Former Smokers: Years since quit: NA  Quit Date: NA  Visit Components: Discussion included one or more decision making aids. yes Discussion included risk/benefits of screening. yes Discussion included potential follow up diagnostic testing for abnormal scans. yes Discussion included meaning and risk of over diagnosis. yes Discussion included meaning and risk of False Positives. yes Discussion included meaning of total radiation exposure. yes  Counseling Included: Importance of adherence to annual lung cancer LDCT screening. yes Impact of comorbidities on ability to participate in the program. yes Ability and willingness to under diagnostic treatment. yes  Smoking Cessation Counseling: Current Smokers:  Discussed importance of smoking cessation. yes Information about tobacco cessation classes and interventions provided to patient. yes Patient provided with "ticket" for LDCT  Scan. yes Symptomatic Patient. yes  Counseling(Intermediate counseling: > three minutes) [address]406 Diagnosis Code: Tobacco Use Z72.0 Asymptomatic Patient no  Counseling  NA Former Smokers:  Discussed the importance of maintaining cigarette abstinence. yes Diagnosis Code: Personal History of Nicotine Dependence. X52.841 Information about tobacco cessation classes and interventions provided to patient. Yes Patient provided with "ticket" for LDCT Scan. yes Written Order for Lung Cancer Screening with LDCT placed in Epic. Yes (CT Chest Lung Cancer Screening Low Dose W/O CM) [id number] Z12.2-Screening of respiratory organs Z87.891-Personal history of nicotine dependence   I spent 25 minutes of face to face time with him discussing the risks and benefits of lung cancer screening. We viewed a power point together that explained in detail the above noted topics. We took the time to pause the power point at intervals to allow for questions to be asked and answered to ensure understanding. We discussed that he had taken the single most powerful action possible to decrease his risk of developing lung cancer when he quit smoking. I counseled him to remain smoke free, and to contact me if he ever had the desire to smoke again so that I can provide resources and tools to help support the effort to remain smoke free. We discussed the time and location of the scan, and that either  [name] or I will call with the results within  24-48 hours of receiving them. He has my card and contact information in the event he needs to speak with me, in addition to a copy of the power point we reviewed as a resource. He verbalized understanding of all of the above and had no further questions upon leaving the office.     I explained to the patient that there has been  a high incidence of coronary artery disease noted on these exams. I explained that this is a non-gated exam therefore degree or severity cannot be determined.  This patient is not on statin therapy. I have asked the patient to follow-up with their PCP regarding any incidental finding of coronary artery disease and management with diet or medication as they feel is clinically indicated. The patient verbalized understanding of the above and had no further questions.    I spent 3 minutes counseling on smoking cessation and the health risks of continued tobacco abuse    [name], NP-C [facility] Personal contact information can be found on Amion  03/11/2022, 8:10 AM

## 2022-03-11 NOTE — Patient Instructions (Signed)
Thank you for participating in the [facility] Cancer Screening Program. It was our pleasure to meet you today. We will call you with the results of your scan within the next few days. Your scan will be assigned a Lung RADS category score by the physicians reading the scans.  This Lung RADS score determines follow up scanning.  See below for description of categories, and follow up screening recommendations. We will be in touch to schedule your follow up screening annually or based on recommendations of our providers. We will fax a copy of your scan results to your Primary Care Physician, or the physician who referred you to the program, to ensure they have the results. Please call the office if you have any questions or concerns regarding your scanning experience or results.  Our office number is [phone]. Please speak with Denise Phelps, RN. , or  Denise Buckner RN, They are  our Lung Cancer Screening RN.'s If They are unavailable when you call, Please leave a message on the voice mail. We will return your call at our earliest convenience.This voice mail is monitored several times a day.  Remember, if your scan is normal, we will scan you annually as long as you continue to meet the criteria for the program. (Age 55-77, Current smoker or smoker who has quit within the last 15 years). If you are a smoker, remember, quitting is the single most powerful action that you can take to decrease your risk of lung cancer and other pulmonary, breathing related problems. We know quitting is hard, and we are here to help.  Please let us know if there is anything we can do to help you meet your goal of quitting. If you are a former smoker, congratulations. We are proud of you! Remain smoke free! Remember you can refer friends or family members through the number above.  We will screen them to make sure they meet criteria for the program. Thank you for helping us take better care of you by  participating in Lung Screening.  You can receive free nicotine replacement therapy ( patches, gum or mints) by calling 1-800-QUIT NOW. Please call so we can get you on the path to becoming  a non-smoker. I know it is hard, but you can do this!  Lung RADS Categories:  Lung RADS 1: no nodules or definitely non-concerning nodules.  Recommendation is for a repeat annual scan in 12 months.  Lung RADS 2:  nodules that are non-concerning in appearance and behavior with a very low likelihood of becoming an active cancer. Recommendation is for a repeat annual scan in 12 months.  Lung RADS 3: nodules that are probably non-concerning , includes nodules with a low likelihood of becoming an active cancer.  Recommendation is for a 6-month repeat screening scan. Often noted after an upper respiratory illness. We will be in touch to make sure you have no questions, and to schedule your 6-month scan.  Lung RADS 4 A: nodules with concerning findings, recommendation is most often for a follow up scan in 3 months or additional testing based on our provider's assessment of the scan. We will be in touch to make sure you have no questions and to schedule the recommended 3 month follow up scan.  Lung RADS 4 B:  indicates findings that are concerning. We will be in touch with you to schedule additional diagnostic testing based on our provider's  assessment of the scan.  Other options for assistance in smoking cessation (   As covered by your insurance benefits)  Hypnosis for smoking cessation  [employer] [phone]  Acupuncture for smoking cessation  [facility] [phone]   

## 2022-03-16 ENCOUNTER — Ambulatory Visit (HOSPITAL_BASED_OUTPATIENT_CLINIC_OR_DEPARTMENT_OTHER)
Admission: RE | Admit: 2022-03-16 | Discharge: 2022-03-16 | Disposition: A | Discharge status: Home / Self Care | Payer: 59 | Source: Ambulatory Visit | Attending: Acute Care | Admitting: Acute Care

## 2022-03-16 DIAGNOSIS — Z122 Encounter for screening for malignant neoplasm of respiratory organs: Secondary | ICD-10-CM | POA: Insufficient documentation

## 2022-03-16 DIAGNOSIS — Z87891 Personal history of nicotine dependence: Secondary | ICD-10-CM

## 2022-03-16 DIAGNOSIS — F17210 Nicotine dependence, cigarettes, uncomplicated: Secondary | ICD-10-CM | POA: Diagnosis present

## 2022-03-16 DIAGNOSIS — I700 Atherosclerosis of aorta: Secondary | ICD-10-CM | POA: Insufficient documentation

## 2022-03-18 ENCOUNTER — Other Ambulatory Visit: Payer: Self-pay

## 2022-03-18 DIAGNOSIS — Z87891 Personal history of nicotine dependence: Secondary | ICD-10-CM

## 2022-03-18 DIAGNOSIS — F17210 Nicotine dependence, cigarettes, uncomplicated: Secondary | ICD-10-CM

## 2022-03-18 DIAGNOSIS — Z122 Encounter for screening for malignant neoplasm of respiratory organs: Secondary | ICD-10-CM

## 2023-03-18 ENCOUNTER — Ambulatory Visit (HOSPITAL_BASED_OUTPATIENT_CLINIC_OR_DEPARTMENT_OTHER): Payer: 59

## 2023-06-27 DIAGNOSIS — I2119 ST elevation (STEMI) myocardial infarction involving other coronary artery of inferior wall: Secondary | ICD-10-CM | POA: Diagnosis not present

## 2023-06-27 DIAGNOSIS — I2510 Atherosclerotic heart disease of native coronary artery without angina pectoris: Secondary | ICD-10-CM | POA: Diagnosis not present

## 2023-06-27 DIAGNOSIS — I519 Heart disease, unspecified: Secondary | ICD-10-CM | POA: Diagnosis not present

## 2023-06-27 DIAGNOSIS — I2111 ST elevation (STEMI) myocardial infarction involving right coronary artery: Secondary | ICD-10-CM | POA: Diagnosis not present

## 2023-06-27 DIAGNOSIS — R001 Bradycardia, unspecified: Secondary | ICD-10-CM | POA: Diagnosis not present

## 2023-06-27 DIAGNOSIS — I340 Nonrheumatic mitral (valve) insufficiency: Secondary | ICD-10-CM | POA: Diagnosis not present

## 2023-06-27 DIAGNOSIS — I429 Cardiomyopathy, unspecified: Secondary | ICD-10-CM | POA: Diagnosis not present

## 2023-06-27 DIAGNOSIS — F17210 Nicotine dependence, cigarettes, uncomplicated: Secondary | ICD-10-CM | POA: Diagnosis not present

## 2023-07-12 ENCOUNTER — Encounter (HOSPITAL_COMMUNITY): Payer: Self-pay

## 2023-07-12 ENCOUNTER — Telehealth (HOSPITAL_COMMUNITY): Payer: Self-pay

## 2023-07-12 NOTE — Telephone Encounter (Signed)
Attempted to call patient in regards to Cardiac Rehab - LM on VM Mailed letter 

## 2023-07-12 NOTE — Telephone Encounter (Signed)
Outside/paper referral received from [facility]. Will fax over Physician order and request further documents. Insurance benefits and eligibility to be determined.

## 2023-07-28 DIAGNOSIS — I2119 ST elevation (STEMI) myocardial infarction involving other coronary artery of inferior wall: Secondary | ICD-10-CM | POA: Diagnosis not present

## 2023-07-28 DIAGNOSIS — Z1330 Encounter for screening examination for mental health and behavioral disorders, unspecified: Secondary | ICD-10-CM | POA: Diagnosis not present

## 2023-07-28 DIAGNOSIS — G629 Polyneuropathy, unspecified: Secondary | ICD-10-CM | POA: Diagnosis not present

## 2023-07-28 DIAGNOSIS — I2111 ST elevation (STEMI) myocardial infarction involving right coronary artery: Secondary | ICD-10-CM | POA: Diagnosis not present

## 2023-08-01 DIAGNOSIS — G629 Polyneuropathy, unspecified: Secondary | ICD-10-CM | POA: Diagnosis not present

## 2023-08-01 DIAGNOSIS — I2111 ST elevation (STEMI) myocardial infarction involving right coronary artery: Secondary | ICD-10-CM | POA: Diagnosis not present

## 2023-08-01 DIAGNOSIS — I2119 ST elevation (STEMI) myocardial infarction involving other coronary artery of inferior wall: Secondary | ICD-10-CM | POA: Diagnosis not present

## 2023-08-08 ENCOUNTER — Telehealth (HOSPITAL_COMMUNITY): Payer: Self-pay

## 2023-08-08 NOTE — Telephone Encounter (Signed)
 Pt called in regards to CR, adv pt we are still waiting on ppw from [facility]. Pt stated he will f/u with [facility].

## 2023-08-18 ENCOUNTER — Telehealth (HOSPITAL_COMMUNITY): Payer: Self-pay

## 2023-08-18 NOTE — Telephone Encounter (Signed)
Called patient to see if he was interested in participating in the Cardiac Rehab Program. Patient stated yes. Patient will come in for orientation on 08/23/23  @ 8AM and will attend the 1230PM exercise class. Went over insurance, patient verbalized understanding.   Pensions consultant.

## 2023-08-18 NOTE — Telephone Encounter (Signed)
Pt insurance is active and benefits verified through [employer]. Co-pay $0.00, DED $2,000.00/$0.00 met, out of pocket $4,000.00/$0.00 met, co-insurance 20%. No pre-authorization required. [employer]port, 08/18/23 @ 2:26PM, REF#[id number]   How many CR sessions are covered? (36 visits for TCR, 72 visits for ICR)72 Is this a lifetime maximum or an annual maximum? Annual Has the member used any of these services to date? No Is there a time limit (weeks/months) on start of program and/or program completion? No

## 2023-08-19 ENCOUNTER — Telehealth (HOSPITAL_COMMUNITY): Payer: Self-pay

## 2023-08-23 ENCOUNTER — Encounter (HOSPITAL_COMMUNITY)
Admission: RE | Admit: 2023-08-23 | Discharge: 2023-08-23 | Disposition: A | Discharge status: Home / Self Care | Payer: BC Managed Care – PPO | Source: Ambulatory Visit | Attending: Cardiology | Admitting: Cardiology

## 2023-08-23 VITALS — BP 112/70 | HR 84 | Ht 72.0 in | Wt 211.2 lb

## 2023-08-23 DIAGNOSIS — I2111 ST elevation (STEMI) myocardial infarction involving right coronary artery: Secondary | ICD-10-CM | POA: Insufficient documentation

## 2023-08-23 DIAGNOSIS — Z955 Presence of coronary angioplasty implant and graft: Secondary | ICD-10-CM | POA: Insufficient documentation

## 2023-08-23 NOTE — Progress Notes (Signed)
Cardiac Rehab Medication Review   Does the patient  feel that his/her medications are working for him/her?  yes  Has the patient been experiencing any side effects to the medications prescribed?  yes  Does the patient measure his/her own blood pressure or blood glucose at home?  no   Does the patient have any problems obtaining medications due to transportation or finances?   no  Understanding of regimen: good Understanding of indications: good Potential of compliance: good    Comments: Pt c/o having gas, not sure which medication is causing this. We reviewed Brilinta and Metoprolol    Lorin Picket 08/23/2023 8:45 AM

## 2023-08-23 NOTE — Progress Notes (Signed)
Cardiac Individual Treatment Plan  Patient Details  Name: Tommy Page MRN: [id number] Date of Birth: May 19, 1961 Referring Provider:   Flowsheet Row INTENSIVE CARDIAC REHAB ORIENT from 08/23/2023 in [facility]  Referring Provider Dr. Consepcion Hearing, MD covering)       Initial Encounter Date:  Flowsheet Row INTENSIVE CARDIAC REHAB ORIENT from 08/23/2023 in [facility]  Date 08/23/23       Visit Diagnosis: 06/27/23 ST elevation myocardial infarction involving right coronary artery (HCC)  06/27/23 Status post coronary artery stent placement  Patient's Home Medications on Admission:  Current Outpatient Medications:    escitalopram (LEXAPRO) 20 MG tablet, Take 20 mg by mouth daily., Disp: , Rfl:    Fluticasone-Umeclidin-Vilant (TRELEGY ELLIPTA) 100-62.5-25 MCG/ACT AEPB, Inhale 100 mcg into the lungs once as needed., Disp: , Rfl:    metoprolol succinate (TOPROL-XL) 25 MG 24 hr tablet, Take 12.5 mg by mouth daily., Disp: , Rfl:    rosuvastatin (CRESTOR) 20 MG tablet, Take 20 mg by mouth daily., Disp: , Rfl:    Wheat Dextrin (BENEFIBER DRINK MIX) PACK, Take 3 g by mouth daily., Disp: , Rfl:    ASPIRIN LOW DOSE 81 MG tablet, Take 81 mg by mouth daily., Disp: , Rfl:    BRILINTA 90 MG TABS tablet, Take 90 mg by mouth 2 (two) times daily., Disp: , Rfl:    hydrocortisone (ANUSOL-HC) 2.5 % rectal cream, Place 1 application rectally 2 (two) times daily. Apply around anus for irritated & painful hemorrhoids, Disp: 15 g, Rfl: 2   nicotine (NICODERM CQ - DOSED IN MG/24 HOURS) 21 mg/24hr patch, Place 21 mg onto the skin daily., Disp: , Rfl:    nicotine polacrilex (NICORETTE) 2 MG gum, Take 2 mg by mouth as needed for smoking cessation., Disp: , Rfl:    omeprazole (PRILOSEC OTC) 20 MG tablet, Take 20 mg by mouth daily., Disp: , Rfl:    PROBIOTIC PRODUCT PO, Take by mouth  daily., Disp: , Rfl:    simvastatin (ZOCOR) 20 MG tablet, Take 20 mg by mouth at bedtime. , Disp: , Rfl:    traMADol (ULTRAM) 50 MG tablet, Take 1-2 tablets (50-100 mg total) by mouth every 6 (six) hours as needed for moderate pain or severe pain., Disp: 40 tablet, Rfl: 0  Past Medical History: Past Medical History:  Diagnosis Date   Anxiety    Depression    GERD (gastroesophageal reflux disease)    Hemorrhoids    HOH (hard of hearing)    BOTH EARS    Tobacco Use: Social History   Tobacco Use  Smoking Status Every Day   Current packs/day: 0.00   Average packs/day: 1 pack/day for 36.0 years (36.0 ttl pk-yrs)   Types: Cigarettes   Start date: 06/26/1979   Last attempt to quit: 06/26/2015   Years since quitting: 8.1  Smokeless Tobacco Never    Labs: Review Flowsheet       Latest Ref Rng & Units 12/16/2015  Labs for ITP Cardiac and Pulmonary Rehab  TCO2 0 - 100 mmol/L 21     Capillary Blood Glucose: Lab Results  Component Value Date   GLUCAP 117 (H) 12/10/2015     Exercise Target Goals: Exercise Program Goal: Individual exercise prescription set using results from initial 6 min walk test and THRR while considering  patient's activity barriers and safety.   Exercise Prescription Goal: Initial exercise prescription builds  to 30-45 minutes a day of aerobic activity, 2-3 days per week.  Home exercise guidelines will be given to patient during program as part of exercise prescription that the participant will acknowledge.  Activity Barriers & Risk Stratification:  Activity Barriers & Cardiac Risk Stratification - 08/23/23 1037       Activity Barriers & Cardiac Risk Stratification   Activity Barriers Back Problems;Neck/Spine Problems    Cardiac Risk Stratification High             6 Minute Walk:  6 Minute Walk     Row Name 08/23/23 0929         6 Minute Walk   Phase Initial     Distance 1755 feet     Walk Time 6 minutes     # of Rest Breaks 0     MPH  3.32     METS 4.2     RPE 8     Perceived Dyspnea  0     VO2 Peak 14.7     Symptoms No     Resting HR 77 bpm     Resting BP 112/70     Resting Oxygen Saturation  96 %     Exercise Oxygen Saturation  during 6 min walk 97 %     Max Ex. HR 95 bpm     Max Ex. BP 138/72     2 Minute Post BP 112/68              Oxygen Initial Assessment:   Oxygen Re-Evaluation:   Oxygen Discharge (Final Oxygen Re-Evaluation):   Initial Exercise Prescription:  Initial Exercise Prescription - 08/23/23 1000       Date of Initial Exercise RX and Referring Provider   Date 08/23/23    Referring Provider Dr. Consepcion Hearing, MD covering)    Expected Discharge Date 11/16/23      Recumbant Bike   Level 2    RPM 60    Watts 25    Minutes 15    METs 4.2      Recumbant Elliptical   Level 2    RPM 60    Watts 50    Minutes 15    METs 4.2      Prescription Details   Frequency (times per week) 3    Duration Progress to 30 minutes of continuous aerobic without signs/symptoms of physical distress      Intensity   THRR 40-80% of Max Heartrate 64-127    Ratings of Perceived Exertion 11-13    Perceived Dyspnea 0-4      Progression   Progression Continue progressive overload as per policy without signs/symptoms or physical distress.      Resistance Training   Training Prescription Yes    Weight 4 lbs    Reps 10-15             Perform Capillary Blood Glucose checks as needed.  Exercise Prescription Changes:   Exercise Comments:   Exercise Goals and Review:   Exercise Goals     Row Name 08/23/23 1038             Exercise Goals   Increase Physical Activity Yes       Intervention Provide advice, education, support and counseling about physical activity/exercise needs.;Develop an individualized exercise prescription for aerobic and resistive training based on initial evaluation findings, risk stratification, comorbidities and participant's personal goals.        Expected Outcomes Short Term: Attend rehab on  a regular basis to increase amount of physical activity.;Long Term: Exercising regularly at least 3-5 days a week.;Long Term: Add in home exercise to make exercise part of routine and to increase amount of physical activity.       Increase Strength and Stamina Yes       Intervention Provide advice, education, support and counseling about physical activity/exercise needs.;Develop an individualized exercise prescription for aerobic and resistive training based on initial evaluation findings, risk stratification, comorbidities and participant's personal goals.       Expected Outcomes Short Term: Increase workloads from initial exercise prescription for resistance, speed, and METs.;Short Term: Perform resistance training exercises routinely during rehab and add in resistance training at home;Long Term: Improve cardiorespiratory fitness, muscular endurance and strength as measured by increased METs and functional capacity ( )       Able to understand and use rate of perceived exertion (RPE) scale Yes       Intervention Provide education and explanation on how to use RPE scale       Expected Outcomes Short Term: Able to use RPE daily in rehab to express subjective intensity level;Long Term:  Able to use RPE to guide intensity level when exercising independently       Knowledge and understanding of Target Heart Rate Range (THRR) Yes       Intervention Provide education and explanation of THRR including how the numbers were predicted and where they are located for reference       Expected Outcomes Short Term: Able to state/look up THRR;Long Term: Able to use THRR to govern intensity when exercising independently;Short Term: Able to use daily as guideline for intensity in rehab       Understanding of Exercise Prescription Yes       Intervention Provide education, explanation, and written materials on patient's individual exercise prescription       Expected  Outcomes Short Term: Able to explain program exercise prescription;Long Term: Able to explain home exercise prescription to exercise independently                Exercise Goals Re-Evaluation :   Discharge Exercise Prescription (Final Exercise Prescription Changes):   Nutrition:  Target Goals: Understanding of nutrition guidelines, daily intake of sodium 1500mg , cholesterol 200mg , calories 30% from fat and 7% or less from saturated fats, daily to have 5 or more servings of fruits and vegetables.  Biometrics:  Pre Biometrics - 08/23/23 0822       Pre Biometrics   Waist Circumference 42.5 inches    Hip Circumference 42.5 inches    Waist to Hip Ratio 1 %    Triceps Skinfold 13 mm    % Body Fat 27.9 %    Grip Strength 44 kg    Flexibility 12 in    Single Leg Stand 30 seconds              Nutrition Therapy Plan and Nutrition Goals:   Nutrition Assessments:  MEDIFICTS Score Key: >=70 Need to make dietary changes  40-70 Heart Healthy Diet <= 40 Therapeutic Level Cholesterol Diet    Picture Your Plate Scores: <16 Unhealthy dietary pattern with much room for improvement. 41-50 Dietary pattern unlikely to meet recommendations for good health and room for improvement. 51-60 More healthful dietary pattern, with some room for improvement.  >60 Healthy dietary pattern, although there may be some specific behaviors that could be improved.    Nutrition Goals Re-Evaluation:   Nutrition Goals Re-Evaluation:   Nutrition Goals Discharge (  Final Nutrition Goals Re-Evaluation):   Psychosocial: Target Goals: Acknowledge presence or absence of significant depression and/or stress, maximize coping skills, provide positive support system. Participant is able to verbalize types and ability to use techniques and skills needed for reducing stress and depression.  Initial Review & Psychosocial Screening:  Initial Psych Review & Screening - 08/23/23 1013       Initial Review    Current issues with History of Depression;Current Sleep Concerns   Pt on Lexapro, feels it working. Voices that he does not feel depressed.     Family Dynamics   Good Support System? Yes   Pt has his brother and his good friends Cindee Lame and Rob     Barriers   Psychosocial barriers to participate in program The patient should benefit from training in stress management and relaxation.      Screening Interventions   Interventions Encouraged to exercise             Quality of Life Scores:  Quality of Life - 08/23/23 1033       Quality of Life   Select Quality of Life      Quality of Life Scores   Health/Function Pre 13.8 %    Socioeconomic Pre 23 %    Psych/Spiritual Pre 14.79 %    Family Pre 28 %    GLOBAL Pre 17.36 %            Scores of 19 and below usually indicate a poorer quality of life in these areas.  A difference of  2-3 points is a clinically meaningful difference.  A difference of 2-3 points in the total score of the Quality of Life Index has been associated with significant improvement in overall quality of life, self-image, physical symptoms, and general health in studies assessing change in quality of life.  PHQ-9: Review Flowsheet       08/23/2023  Depression screen PHQ 2/9  Decreased Interest 2  Down, Depressed, Hopeless 0  PHQ - 2 Score 2  Altered sleeping 2  Tired, decreased energy 1  Change in appetite 0  Feeling bad or failure about yourself  0  Trouble concentrating 0  Moving slowly or fidgety/restless 0  Suicidal thoughts 0  PHQ-9 Score 5  Difficult doing work/chores Not difficult at all   Interpretation of Total Score  Total Score Depression Severity:  1-4 = Minimal depression, 5-9 = Mild depression, 10-14 = Moderate depression, 15-19 = Moderately severe depression, 20-27 = Severe depression   Psychosocial Evaluation and Intervention:   Psychosocial Re-Evaluation:   Psychosocial Discharge (Final Psychosocial  Re-Evaluation):   Vocational Rehabilitation: Provide vocational rehab assistance to qualifying candidates.   Vocational Rehab Evaluation & Intervention:  Vocational Rehab - 08/23/23 1015       Initial Vocational Rehab Evaluation & Intervention   Assessment shows need for Vocational Rehabilitation No   Pt is employed            Education: Education Goals: Education classes will be provided on a weekly basis, covering required topics. Participant will state understanding/return demonstration of topics presented.     Core Videos: Exercise    Move It!  Clinical staff conducted group or individual video education with verbal and written material and guidebook.  Patient learns the recommended Pritikin exercise program. Exercise with the goal of living a long, healthy life. Some of the health benefits of exercise include controlled diabetes, healthier blood pressure levels, improved cholesterol levels, improved heart and lung capacity, improved sleep,  and better body composition. Everyone should speak with their doctor before starting or changing an exercise routine.  Biomechanical Limitations Clinical staff conducted group or individual video education with verbal and written material and guidebook.  Patient learns how biomechanical limitations can impact exercise and how we can mitigate and possibly overcome limitations to have an impactful and balanced exercise routine.  Body Composition Clinical staff conducted group or individual video education with verbal and written material and guidebook.  Patient learns that body composition (ratio of muscle mass to fat mass) is a key component to assessing overall fitness, rather than body weight alone. Increased fat mass, especially visceral belly fat, can put [place] at increased risk for metabolic syndrome, type 2 diabetes, heart disease, and even death. It is recommended to combine diet and exercise (cardiovascular and resistance training) to  improve your body composition. Seek guidance from your physician and exercise physiologist before implementing an exercise routine.  Exercise Action Plan Clinical staff conducted group or individual video education with verbal and written material and guidebook.  Patient learns the recommended strategies to achieve and enjoy long-term exercise adherence, including variety, self-motivation, self-efficacy, and positive decision making. Benefits of exercise include fitness, good health, weight management, more energy, better sleep, less stress, and overall well-being.  Medical   Heart Disease Risk Reduction Clinical staff conducted group or individual video education with verbal and written material and guidebook.  Patient learns our heart is our most vital organ as it circulates oxygen, nutrients, white blood cells, and hormones throughout the entire body, and carries waste away. Data supports a plant-based eating plan like the Pritikin Program for its effectiveness in slowing progression of and reversing heart disease. The video provides a number of recommendations to address heart disease.   Metabolic Syndrome and Belly Fat  Clinical staff conducted group or individual video education with verbal and written material and guidebook.  Patient learns what metabolic syndrome is, how it leads to heart disease, and how one can reverse it and keep it from coming back. You have metabolic syndrome if you have 3 of the following 5 criteria: abdominal obesity, high blood pressure, high triglycerides, low HDL cholesterol, and high blood sugar.  Hypertension and Heart Disease Clinical staff conducted group or individual video education with verbal and written material and guidebook.  Patient learns that high blood pressure, or hypertension, is very common in the [place]. Hypertension is largely due to excessive salt intake, but other important risk factors include being overweight, physical inactivity,  drinking too much alcohol, smoking, and not eating enough potassium from fruits and vegetables. High blood pressure is a leading risk factor for heart attack, stroke, congestive heart failure, dementia, kidney failure, and premature death. Long-term effects of excessive salt intake include stiffening of the arteries and thickening of heart muscle and organ damage. Recommendations include ways to reduce hypertension and the risk of heart disease.  Diseases of Our Time - Focusing on Diabetes Clinical staff conducted group or individual video education with verbal and written material and guidebook.  Patient learns why the best way to stop diseases of our time is prevention, through food and other lifestyle changes. Medicine (such as prescription pills and surgeries) is often only a Band-Aid on the problem, not a long-term solution. Most common diseases of our time include obesity, type 2 diabetes, hypertension, heart disease, and cancer. The Pritikin Program is recommended and has been proven to help reduce, reverse, and/or prevent the damaging effects of metabolic syndrome.  Nutrition  Overview of the Pritikin Eating Plan  Clinical staff conducted group or individual video education with verbal and written material and guidebook.  Patient learns about the Pritikin Eating Plan for disease risk reduction. The Pritikin Eating Plan emphasizes a wide variety of unrefined, minimally-processed carbohydrates, like fruits, vegetables, whole grains, and legumes. Go, Caution, and Stop food choices are explained. Plant-based and lean animal proteins are emphasized. Rationale provided for low sodium intake for blood pressure control, low added sugars for blood sugar stabilization, and low added fats and oils for coronary artery disease risk reduction and weight management.  Calorie Density  Clinical staff conducted group or individual video education with verbal and written material and guidebook.  Patient learns  about calorie density and how it impacts the Pritikin Eating Plan. Knowing the characteristics of the food you choose will help you decide whether those foods will lead to weight gain or weight loss, and whether you want to consume more or less of them. Weight loss is usually a side effect of the Pritikin Eating Plan because of its focus on low calorie-dense foods.  Label Reading  Clinical staff conducted group or individual video education with verbal and written material and guidebook.  Patient learns about the Pritikin recommended label reading guidelines and corresponding recommendations regarding calorie density, added sugars, sodium content, and whole grains.  Dining Out - Part 1  Clinical staff conducted group or individual video education with verbal and written material and guidebook.  Patient learns that restaurant meals can be sabotaging because they can be so high in calories, fat, sodium, and/or sugar. Patient learns recommended strategies on how to positively address this and avoid unhealthy pitfalls.  Facts on Fats  Clinical staff conducted group or individual video education with verbal and written material and guidebook.  Patient learns that lifestyle modifications can be just as effective, if not more so, as many medications for lowering your risk of heart disease. A Pritikin lifestyle can help to reduce your risk of inflammation and atherosclerosis (cholesterol build-up, or plaque, in the artery walls). Lifestyle interventions such as dietary choices and physical activity address the cause of atherosclerosis. A review of the types of fats and their impact on blood cholesterol levels, along with dietary recommendations to reduce fat intake is also included.  Nutrition Action Plan  Clinical staff conducted group or individual video education with verbal and written material and guidebook.  Patient learns how to incorporate Pritikin recommendations into their lifestyle.  Recommendations include planning and keeping personal health goals in mind as an important part of their success.  Healthy Mind-Set    Healthy Minds, Bodies, Hearts  Clinical staff conducted group or individual video education with verbal and written material and guidebook.  Patient learns how to identify when they are stressed. Video will discuss the impact of that stress, as well as the many benefits of stress management. Patient will also be introduced to stress management techniques. The way we think, act, and feel has an impact on our hearts.  How Our Thoughts Can Heal Our Hearts  Clinical staff conducted group or individual video education with verbal and written material and guidebook.  Patient learns that negative thoughts can cause depression and anxiety. This can result in negative lifestyle behavior and serious health problems. Cognitive behavioral therapy is an effective method to help control our thoughts in order to change and improve our emotional outlook.  Additional Videos:  Exercise    Improving Performance  Clinical staff conducted group or individual  video education with verbal and written material and guidebook.  Patient learns to use a non-linear approach by alternating intensity levels and lengths of time spent exercising to help burn more calories and lose more body fat. Cardiovascular exercise helps improve heart health, metabolism, hormonal balance, blood sugar control, and recovery from fatigue. Resistance training improves strength, endurance, balance, coordination, reaction time, metabolism, and muscle mass. Flexibility exercise improves circulation, posture, and balance. Seek guidance from your physician and exercise physiologist before implementing an exercise routine and learn your capabilities and proper form for all exercise.  Introduction to Yoga  Clinical staff conducted group or individual video education with verbal and written material and guidebook.   Patient learns about yoga, a discipline of the coming together of mind, breath, and body. The benefits of yoga include improved flexibility, improved range of motion, better posture and core strength, increased lung function, weight loss, and positive self-image. Yoga's heart health benefits include lowered blood pressure, healthier heart rate, decreased cholesterol and triglyceride levels, improved immune function, and reduced stress. Seek guidance from your physician and exercise physiologist before implementing an exercise routine and learn your capabilities and proper form for all exercise.  Medical   Aging: Enhancing Your Quality of Life  Clinical staff conducted group or individual video education with verbal and written material and guidebook.  Patient learns key strategies and recommendations to stay in good physical health and enhance quality of life, such as prevention strategies, having an advocate, securing a Health Care Proxy and Power of Attorney, and keeping a list of medications and system for tracking them. It also discusses how to avoid risk for bone loss.  Biology of Weight Control  Clinical staff conducted group or individual video education with verbal and written material and guidebook.  Patient learns that weight gain occurs because we consume more calories than we burn (eating more, moving less). Even if your body weight is normal, you may have higher ratios of fat compared to muscle mass. Too much body fat puts you at increased risk for cardiovascular disease, heart attack, stroke, type 2 diabetes, and obesity-related cancers. In addition to exercise, following the Pritikin Eating Plan can help reduce your risk.  Decoding Lab Results  Clinical staff conducted group or individual video education with verbal and written material and guidebook.  Patient learns that lab test reflects one measurement whose values change over time and are influenced by many factors, including  medication, stress, sleep, exercise, food, hydration, pre-existing medical conditions, and more. It is recommended to use the knowledge from this video to become more involved with your lab results and evaluate your numbers to speak with your doctor.   Diseases of Our Time - Overview  Clinical staff conducted group or individual video education with verbal and written material and guidebook.  Patient learns that according to the CDC, 50% to 70% of chronic diseases (such as obesity, type 2 diabetes, elevated lipids, hypertension, and heart disease) are avoidable through lifestyle improvements including healthier food choices, listening to satiety cues, and increased physical activity.  Sleep Disorders Clinical staff conducted group or individual video education with verbal and written material and guidebook.  Patient learns how good quality and duration of sleep are important to overall health and well-being. Patient also learns about sleep disorders and how they impact health along with recommendations to address them, including discussing with a physician.  Nutrition  Dining Out - Part 2 Clinical staff conducted group or individual video education with verbal and written  material and guidebook.  Patient learns how to plan ahead and communicate in order to maximize their dining experience in a healthy and nutritious manner. Included are recommended food choices based on the type of restaurant the patient is visiting.   Fueling a Banker conducted group or individual video education with verbal and written material and guidebook.  There is a strong connection between our food choices and our health. Diseases like obesity and type 2 diabetes are very prevalent and are in large-part due to lifestyle choices. The Pritikin Eating Plan provides plenty of food and hunger-curbing satisfaction. It is easy to follow, affordable, and helps reduce health risks.  Menu Workshop  Clinical  staff conducted group or individual video education with verbal and written material and guidebook.  Patient learns that restaurant meals can sabotage health goals because they are often packed with calories, fat, sodium, and sugar. Recommendations include strategies to plan ahead and to communicate with the manager, chef, or server to help order a healthier meal.  Planning Your Eating Strategy  Clinical staff conducted group or individual video education with verbal and written material and guidebook.  Patient learns about the Pritikin Eating Plan and its benefit of reducing the risk of disease. The Pritikin Eating Plan does not focus on calories. Instead, it emphasizes high-quality, nutrient-rich foods. By knowing the characteristics of the foods, we choose, we can determine their calorie density and make informed decisions.  Targeting Your Nutrition Priorities  Clinical staff conducted group or individual video education with verbal and written material and guidebook.  Patient learns that lifestyle habits have a tremendous impact on disease risk and progression. This video provides eating and physical activity recommendations based on your personal health goals, such as reducing LDL cholesterol, losing weight, preventing or controlling type 2 diabetes, and reducing high blood pressure.  Vitamins and Minerals  Clinical staff conducted group or individual video education with verbal and written material and guidebook.  Patient learns different ways to obtain key vitamins and minerals, including through a recommended healthy diet. It is important to discuss all supplements you take with your doctor.   Healthy Mind-Set    Smoking Cessation  Clinical staff conducted group or individual video education with verbal and written material and guidebook.  Patient learns that cigarette smoking and tobacco addiction pose a serious health risk which affects millions of people. Stopping smoking will  significantly reduce the risk of heart disease, lung disease, and many forms of cancer. Recommended strategies for quitting are covered, including working with your doctor to develop a successful plan.  Culinary   Becoming a Set designer conducted group or individual video education with verbal and written material and guidebook.  Patient learns that cooking at home can be healthy, cost-effective, quick, and puts them in control. Keys to cooking healthy recipes will include looking at your recipe, assessing your equipment needs, planning ahead, making it simple, choosing cost-effective seasonal ingredients, and limiting the use of added fats, salts, and sugars.  Cooking - Breakfast and Snacks  Clinical staff conducted group or individual video education with verbal and written material and guidebook.  Patient learns how important breakfast is to satiety and nutrition through the entire day. Recommendations include key foods to eat during breakfast to help stabilize blood sugar levels and to prevent overeating at meals later in the day. Planning ahead is also a key component.  Cooking - Educational psychologist conducted group or individual  video education with verbal and written material and guidebook.  Patient learns eating strategies to improve overall health, including an approach to cook more at home. Recommendations include thinking of animal protein as a side on your plate rather than center stage and focusing instead on lower calorie dense options like vegetables, fruits, whole grains, and plant-based proteins, such as beans. Making sauces in large quantities to freeze for later and leaving the skin on your vegetables are also recommended to maximize your experience.  Cooking - Healthy Salads and Dressing Clinical staff conducted group or individual video education with verbal and written material and guidebook.  Patient learns that vegetables, fruits, whole grains,  and legumes are the foundations of the Pritikin Eating Plan. Recommendations include how to incorporate each of these in flavorful and healthy salads, and how to create homemade salad dressings. Proper handling of ingredients is also covered. Cooking - Soups and [employer] - Soups and Desserts Clinical staff conducted group or individual video education with verbal and written material and guidebook.  Patient learns that Pritikin soups and desserts make for easy, nutritious, and delicious snacks and meal components that are low in sodium, fat, sugar, and calorie density, while high in vitamins, minerals, and filling fiber. Recommendations include simple and healthy ideas for soups and desserts.   Overview     The Pritikin Solution Program Overview Clinical staff conducted group or individual video education with verbal and written material and guidebook.  Patient learns that the results of the Pritikin Program have been documented in more than 100 articles published in peer-reviewed journals, and the benefits include reducing risk factors for (and, in some cases, even reversing) high cholesterol, high blood pressure, type 2 diabetes, obesity, and more! An overview of the three key pillars of the Pritikin Program will be covered: eating well, doing regular exercise, and having a healthy mind-set.  WORKSHOPS  Exercise: Exercise Basics: Building Your Action Plan Clinical staff led group instruction and group discussion with PowerPoint presentation and patient guidebook. To enhance the learning environment the use of posters, models and videos may be added. At the conclusion of this workshop, patients will comprehend the difference between physical activity and exercise, as well as the benefits of incorporating both, into their routine. Patients will understand the FITT (Frequency, Intensity, Time, and Type) principle and how to use it to build an exercise action plan. In addition, safety  concerns and other considerations for exercise and cardiac rehab will be addressed by the presenter. The purpose of this lesson is to promote a comprehensive and effective weekly exercise routine in order to improve patients' overall level of fitness.   Managing Heart Disease: Your Path to a Healthier Heart Clinical staff led group instruction and group discussion with PowerPoint presentation and patient guidebook. To enhance the learning environment the use of posters, models and videos may be added.At the conclusion of this workshop, patients will understand the anatomy and physiology of the heart. Additionally, they will understand how Pritikin's three pillars impact the risk factors, the progression, and the management of heart disease.  The purpose of this lesson is to provide a high-level overview of the heart, heart disease, and how the Pritikin lifestyle positively impacts risk factors.  Exercise Biomechanics Clinical staff led group instruction and group discussion with PowerPoint presentation and patient guidebook. To enhance the learning environment the use of posters, models and videos may be added. Patients will learn how the structural parts of their bodies function and how these  functions impact their daily activities, movement, and exercise. Patients will learn how to promote a neutral spine, learn how to manage pain, and identify ways to improve their physical movement in order to promote healthy living. The purpose of this lesson is to expose patients to common physical limitations that impact physical activity. Participants will learn practical ways to adapt and manage aches and pains, and to minimize their effect on regular exercise. Patients will learn how to maintain good posture while sitting, walking, and lifting.  Balance Training and Fall Prevention  Clinical staff led group instruction and group discussion with PowerPoint presentation and patient guidebook. To  enhance the learning environment the use of posters, models and videos may be added. At the conclusion of this workshop, patients will understand the importance of their sensorimotor skills (vision, proprioception, and the vestibular system) in maintaining their ability to balance as they age. Patients will apply a variety of balancing exercises that are appropriate for their current level of function. Patients will understand the common causes for poor balance, possible solutions to these problems, and ways to modify their physical environment in order to minimize their fall risk. The purpose of this lesson is to teach patients about the importance of maintaining balance as they age and ways to minimize their risk of falling.  WORKSHOPS   Nutrition:  Fueling a Ship broker led group instruction and group discussion with PowerPoint presentation and patient guidebook. To enhance the learning environment the use of posters, models and videos may be added. Patients will review the foundational principles of the Pritikin Eating Plan and understand what constitutes a serving size in each of the food groups. Patients will also learn Pritikin-friendly foods that are better choices when away from home and review make-ahead meal and snack options. Calorie density will be reviewed and applied to three nutrition priorities: weight maintenance, weight loss, and weight gain. The purpose of this lesson is to reinforce (in a group setting) the key concepts around what patients are recommended to eat and how to apply these guidelines when away from home by planning and selecting Pritikin-friendly options. Patients will understand how calorie density may be adjusted for different weight management goals.  Mindful Eating  Clinical staff led group instruction and group discussion with PowerPoint presentation and patient guidebook. To enhance the learning environment the use of posters, models and videos may  be added. Patients will briefly review the concepts of the Pritikin Eating Plan and the importance of low-calorie dense foods. The concept of mindful eating will be introduced as well as the importance of paying attention to internal hunger signals. Triggers for non-hunger eating and techniques for dealing with triggers will be explored. The purpose of this lesson is to provide patients with the opportunity to review the basic principles of the Pritikin Eating Plan, discuss the value of eating mindfully and how to measure internal cues of hunger and fullness using the Hunger Scale. Patients will also discuss reasons for non-hunger eating and learn strategies to use for controlling emotional eating.  Targeting Your Nutrition Priorities Clinical staff led group instruction and group discussion with PowerPoint presentation and patient guidebook. To enhance the learning environment the use of posters, models and videos may be added. Patients will learn how to determine their genetic susceptibility to disease by reviewing their family history. Patients will gain insight into the importance of diet as part of an overall healthy lifestyle in mitigating the impact of genetics and other environmental insults. The purpose  of this lesson is to provide patients with the opportunity to assess their personal nutrition priorities by looking at their family history, their own health history and current risk factors. Patients will also be able to discuss ways of prioritizing and modifying the Pritikin Eating Plan for their highest risk areas  Menu  Clinical staff led group instruction and group discussion with PowerPoint presentation and patient guidebook. To enhance the learning environment the use of posters, models and videos may be added. Using menus brought in from [employer], or printed from [employer], patients will apply the Pritikin dining out guidelines that were presented in the [employer] video. Patients will also be able to practice these guidelines in a variety of provided scenarios. The purpose of this lesson is to provide patients with the opportunity to practice hands-on learning of the Pritikin Dining Out guidelines with actual menus and practice scenarios.  Label Reading Clinical staff led group instruction and group discussion with PowerPoint presentation and patient guidebook. To enhance the learning environment the use of posters, models and videos may be added. Patients will review and discuss the Pritikin label reading guidelines presented in Pritikin's Label Reading Educational series video. Using fool labels brought in from local grocery stores and markets, patients will apply the label reading guidelines and determine if the packaged food meet the Pritikin guidelines. The purpose of this lesson is to provide patients with the opportunity to review, discuss, and practice hands-on learning of the Pritikin Label Reading guidelines with actual packaged food labels. Cooking School  Pritikin's [employer] are designed to teach patients ways to prepare quick, simple, and affordable recipes at home. The importance of nutrition's role in chronic disease risk reduction is reflected in its emphasis in the overall Pritikin program. By learning how to prepare essential core Pritikin Eating Plan recipes, patients will increase control over what they eat; be able to customize the flavor of foods without the use of added salt, sugar, or fat; and improve the quality of the food they consume. By learning a set of core recipes which are easily assembled, quickly prepared, and affordable, patients are more likely to prepare more healthy foods at home. These workshops focus on convenient breakfasts, simple entres, side dishes, and desserts which can be prepared with minimal effort and are consistent with nutrition recommendations for cardiovascular risk reduction. Cooking  [employer] are taught by a Armed forces logistics/support/administrative officer (RD) who has been trained by the [employer]. The chef or RD has a clear understanding of the importance of minimizing - if not completely eliminating - added fat, sugar, and sodium in recipes. Throughout the series of Cooking School Workshop sessions, patients will learn about healthy ingredients and efficient methods of cooking to build confidence in their capability to prepare    Cooking School weekly topics:  Adding Flavor- Sodium-Free  Fast and Healthy Breakfasts  Powerhouse Plant-Based Proteins  Satisfying Salads and Dressings  Simple Sides and Sauces  International Cuisine-Spotlight on the [employer] Zones  Delicious Desserts  Savory Soups  [employer] - Meals in a Astronomer Appetizers and Snacks  Comforting Weekend Breakfasts  One-Pot Wonders   Fast Evening Meals  Landscape architect Your Pritikin Plate  WORKSHOPS   Healthy Mindset (Psychosocial):  Focused Goals, Sustainable Changes Clinical staff led group instruction and group discussion with PowerPoint presentation and patient guidebook. To enhance the learning environment the use of posters, models and videos may be added.  Patients will be able to apply effective goal setting strategies to establish at least one personal goal, and then take consistent, meaningful action toward that goal. They will learn to identify common barriers to achieving personal goals and develop strategies to overcome them. Patients will also gain an understanding of how our mind-set can impact our ability to achieve goals and the importance of cultivating a positive and growth-oriented mind-set. The purpose of this lesson is to provide patients with a deeper understanding of how to set and achieve personal goals, as well as the tools and strategies needed to overcome common obstacles which may arise along the way.  From Head to Heart: The Power of a Healthy  Outlook  Clinical staff led group instruction and group discussion with PowerPoint presentation and patient guidebook. To enhance the learning environment the use of posters, models and videos may be added. Patients will be able to recognize and describe the impact of emotions and mood on physical health. They will discover the importance of self-care and explore self-care practices which may work for them. Patients will also learn how to utilize the 4 C's to cultivate a healthier outlook and better manage stress and challenges. The purpose of this lesson is to demonstrate to patients how a healthy outlook is an essential part of maintaining good health, especially as they continue their cardiac rehab journey.  Healthy Sleep for a Healthy Heart Clinical staff led group instruction and group discussion with PowerPoint presentation and patient guidebook. To enhance the learning environment the use of posters, models and videos may be added. At the conclusion of this workshop, patients will be able to demonstrate knowledge of the importance of sleep to overall health, well-being, and quality of life. They will understand the symptoms of, and treatments for, common sleep disorders. Patients will also be able to identify daytime and nighttime behaviors which impact sleep, and they will be able to apply these tools to help manage sleep-related challenges. The purpose of this lesson is to provide patients with a general overview of sleep and outline the importance of quality sleep. Patients will learn about a few of the most common sleep disorders. Patients will also be introduced to the concept of "sleep hygiene," and discover ways to self-manage certain sleeping problems through simple daily behavior changes. Finally, the workshop will motivate patients by clarifying the links between quality sleep and their goals of heart-healthy living.   Recognizing and Reducing Stress Clinical staff led group instruction and  group discussion with PowerPoint presentation and patient guidebook. To enhance the learning environment the use of posters, models and videos may be added. At the conclusion of this workshop, patients will be able to understand the types of stress reactions, differentiate between acute and chronic stress, and recognize the impact that chronic stress has on their health. They will also be able to apply different coping mechanisms, such as reframing negative self-talk. Patients will have the opportunity to practice a variety of stress management techniques, such as deep abdominal breathing, progressive muscle relaxation, and/or guided imagery.  The purpose of this lesson is to educate patients on the role of stress in their lives and to provide healthy techniques for coping with it.  Learning Barriers/Preferences:  Learning Barriers/Preferences - 08/23/23 1032       Learning Barriers/Preferences   Learning Barriers Hearing;Sight   wears glasses and bilateral hearing aids   Learning Preferences Audio;Group Instruction;Individual Instruction;Skilled Demonstration;Verbal Instruction  Education Topics:  Knowledge Questionnaire Score:  Knowledge Questionnaire Score - 08/23/23 1032       Knowledge Questionnaire Score   Pre Score 27/28             Core Components/Risk Factors/Patient Goals at Admission:  Personal Goals and Risk Factors at Admission - 08/23/23 1016       Core Components/Risk Factors/Patient Goals on Admission    Weight Management Yes;Weight Loss    Intervention Weight Management: Develop a combined nutrition and exercise program designed to reach desired caloric intake, while maintaining appropriate intake of nutrient and fiber, sodium and fats, and appropriate energy expenditure required for the weight goal.;Weight Management: Provide education and appropriate resources to help participant work on and attain dietary goals.;Weight Management/Obesity: Establish  reasonable short term and long term weight goals.    Admit Weight 211 lb 3.2 oz (95.8 kg)    Goal Weight: Short Term 196 lb (88.9 kg)    Expected Outcomes Short Term: Continue to assess and modify interventions until short term weight is achieved;Long Term: Adherence to nutrition and physical activity/exercise program aimed toward attainment of established weight goal;Weight Loss: Understanding of general recommendations for a balanced deficit meal plan, which promotes 1-2 lb weight loss per week and includes a negative energy balance of [phone] kcal/d;Understanding recommendations for meals to include 15-35% energy as protein, 25-35% energy from fat, 35-60% energy from carbohydrates, less than 200mg  of dietary cholesterol, 20-35 gm of total fiber daily;Understanding of distribution of calorie intake throughout the day with the consumption of 4-5 meals/snacks    Tobacco Cessation Yes    Number of packs per day 0    Intervention Assist the participant in steps to quit. Provide individualized education and counseling about committing to Tobacco Cessation, relapse prevention, and pharmacological support that can be provided by physician.;Education officer, environmental, assist with locating and accessing local/national Quit Smoking programs, and support quit date choice.   Pt quit smoking 06/27/23   Expected Outcomes Long Term: Complete abstinence from all tobacco products for at least 12 months from quit date.    Hypertension Yes    Intervention Provide education on lifestyle modifcations including regular physical activity/exercise, weight management, moderate sodium restriction and increased consumption of fresh fruit, vegetables, and low fat dairy, alcohol moderation, and smoking cessation.;Monitor prescription use compliance.    Expected Outcomes Short Term: Continued assessment and intervention until BP is < 140/26mm HG in hypertensive participants. < 130/52mm HG in hypertensive participants with  diabetes, heart failure or chronic kidney disease.;Long Term: Maintenance of blood pressure at goal levels.    Lipids Yes    Intervention Provide education and support for participant on nutrition & aerobic/resistive exercise along with prescribed medications to achieve LDL 70mg , HDL >40mg .    Expected Outcomes Short Term: Participant states understanding of desired cholesterol values and is compliant with medications prescribed. Participant is following exercise prescription and nutrition guidelines.;Long Term: Cholesterol controlled with medications as prescribed, with individualized exercise RX and with personalized nutrition plan. Value goals: LDL < 70mg , HDL > 40 mg.             Core Components/Risk Factors/Patient Goals Review:    Core Components/Risk Factors/Patient Goals at Discharge (Final Review):    ITP Comments:  ITP Comments     Row Name 08/23/23 1008           ITP Comments Armanda Magic, MD: Medical Director.  Introduction to the Pritikin Education Program/Intensive Cardiac Rehab.  Initial orientation packet reviewed witht  he patient.                Comments: Participant attended orientation for the cardiac rehabilitation program on  08/23/2023  to perform initial intake and exercise walk test. Patient introduced to the Pritikin Program education and orientation packet was reviewed. Completed 6-minute walk test, measurements, initial ITP, and exercise prescription. Vital signs stable. Telemetry-normal sinus rhythm, asymptomatic.   Service time was from 7:50 to 9:58.

## 2023-08-29 ENCOUNTER — Ambulatory Visit (HOSPITAL_COMMUNITY): Payer: BC Managed Care – PPO

## 2023-08-29 ENCOUNTER — Encounter (HOSPITAL_COMMUNITY)
Admission: RE | Admit: 2023-08-29 | Discharge: 2023-08-29 | Disposition: A | Discharge status: Home / Self Care | Payer: BC Managed Care – PPO | Source: Ambulatory Visit | Attending: Cardiology | Admitting: Cardiology

## 2023-08-29 DIAGNOSIS — Z48812 Encounter for surgical aftercare following surgery on the circulatory system: Secondary | ICD-10-CM | POA: Insufficient documentation

## 2023-08-29 DIAGNOSIS — Z955 Presence of coronary angioplasty implant and graft: Secondary | ICD-10-CM | POA: Insufficient documentation

## 2023-08-29 DIAGNOSIS — Z87891 Personal history of nicotine dependence: Secondary | ICD-10-CM | POA: Diagnosis not present

## 2023-08-29 DIAGNOSIS — I252 Old myocardial infarction: Secondary | ICD-10-CM | POA: Insufficient documentation

## 2023-08-29 DIAGNOSIS — I2111 ST elevation (STEMI) myocardial infarction involving right coronary artery: Secondary | ICD-10-CM

## 2023-08-29 NOTE — Progress Notes (Signed)
Cardiac Individual Treatment Plan  Patient Details  Name: Tommy Page MRN: [id number] Date of Birth: 1961-05-03 Referring Provider:   Flowsheet Row INTENSIVE CARDIAC REHAB ORIENT from 08/23/2023 in [facility] for Heart, Vascular, & Lung Health  Referring Provider Dr. Consepcion Hearing, MD covering)       Initial Encounter Date:  Flowsheet Row INTENSIVE CARDIAC REHAB ORIENT from 08/23/2023 in [facility] for Heart, Vascular, & Lung Health  Date 08/23/23       Visit Diagnosis: 06/27/23 ST elevation myocardial infarction involving right coronary artery (HCC)  06/27/23 Status post coronary artery stent placement  Patient's Home Medications on Admission:  Current Outpatient Medications:    ASPIRIN LOW DOSE 81 MG tablet, Take 81 mg by mouth daily., Disp: , Rfl:    BRILINTA 90 MG TABS tablet, Take 90 mg by mouth 2 (two) times daily., Disp: , Rfl:    escitalopram (LEXAPRO) 20 MG tablet, Take 20 mg by mouth daily., Disp: , Rfl:    Fluticasone-Umeclidin-Vilant (TRELEGY ELLIPTA) 100-62.5-25 MCG/ACT AEPB, Inhale 100 mcg into the lungs once as needed., Disp: , Rfl:    hydrocortisone (ANUSOL-HC) 2.5 % rectal cream, Place 1 application rectally 2 (two) times daily. Apply around anus for irritated & painful hemorrhoids, Disp: 15 g, Rfl: 2   metoprolol succinate (TOPROL-XL) 25 MG 24 hr tablet, Take 12.5 mg by mouth daily., Disp: , Rfl:    nicotine (NICODERM CQ - DOSED IN MG/24 HOURS) 21 mg/24hr patch, Place 21 mg onto the skin daily., Disp: , Rfl:    nicotine polacrilex (NICORETTE) 2 MG gum, Take 2 mg by mouth as needed for smoking cessation., Disp: , Rfl:    omeprazole (PRILOSEC OTC) 20 MG tablet, Take 20 mg by mouth daily., Disp: , Rfl:    PROBIOTIC PRODUCT PO, Take by mouth daily., Disp: , Rfl:    rosuvastatin (CRESTOR) 20 MG tablet, Take 20 mg by mouth daily., Disp: , Rfl:    simvastatin (ZOCOR) 20 MG tablet, Take 20 mg by mouth at bedtime.  , Disp: , Rfl:    traMADol (ULTRAM) 50 MG tablet, Take 1-2 tablets (50-100 mg total) by mouth every 6 (six) hours as needed for moderate pain or severe pain., Disp: 40 tablet, Rfl: 0   Wheat Dextrin (BENEFIBER DRINK MIX) PACK, Take 3 g by mouth daily., Disp: , Rfl:   Past Medical History: Past Medical History:  Diagnosis Date   Anxiety    Depression    GERD (gastroesophageal reflux disease)    Hemorrhoids    HOH (hard of hearing)    BOTH EARS    Tobacco Use: Social History   Tobacco Use  Smoking Status Every Day   Current packs/day: 0.00   Average packs/day: 1 pack/day for 36.0 years (36.0 ttl pk-yrs)   Types: Cigarettes   Start date: 06/26/1979   Last attempt to quit: 06/26/2015   Years since quitting: 8.1  Smokeless Tobacco Never    Labs: Review Flowsheet       Latest Ref Rng & Units 12/16/2015  Labs for ITP Cardiac and Pulmonary Rehab  TCO2 0 - 100 mmol/L 21     Capillary Blood Glucose: Lab Results  Component Value Date   GLUCAP 117 (H) 12/10/2015     Exercise Target Goals: Exercise Program Goal: Individual exercise prescription set using results from initial 6 min walk test and THRR while considering  patient's activity barriers and safety.   Exercise Prescription Goal: Initial exercise prescription builds  to 30-45 minutes a day of aerobic activity, 2-3 days per week.  Home exercise guidelines will be given to patient during program as part of exercise prescription that the participant will acknowledge.  Activity Barriers & Risk Stratification:  Activity Barriers & Cardiac Risk Stratification - 08/23/23 1037       Activity Barriers & Cardiac Risk Stratification   Activity Barriers Back Problems;Neck/Spine Problems    Cardiac Risk Stratification High             6 Minute Walk:  6 Minute Walk     Row Name 08/23/23 0929         6 Minute Walk   Phase Initial     Distance 1755 feet     Walk Time 6 minutes     # of Rest Breaks 0     MPH 3.32      METS 4.2     RPE 8     Perceived Dyspnea  0     VO2 Peak 14.7     Symptoms No     Resting HR 77 bpm     Resting BP 112/70     Resting Oxygen Saturation  96 %     Exercise Oxygen Saturation  during 6 min walk 97 %     Max Ex. HR 95 bpm     Max Ex. BP 138/72     2 Minute Post BP 112/68              Oxygen Initial Assessment:   Oxygen Re-Evaluation:   Oxygen Discharge (Final Oxygen Re-Evaluation):   Initial Exercise Prescription:  Initial Exercise Prescription - 08/23/23 1000       Date of Initial Exercise RX and Referring Provider   Date 08/23/23    Referring Provider Dr. Consepcion Hearing, MD covering)    Expected Discharge Date 11/16/23      Recumbant Bike   Level 2    RPM 60    Watts 25    Minutes 15    METs 4.2      Recumbant Elliptical   Level 2    RPM 60    Watts 50    Minutes 15    METs 4.2      Prescription Details   Frequency (times per week) 3    Duration Progress to 30 minutes of continuous aerobic without signs/symptoms of physical distress      Intensity   THRR 40-80% of Max Heartrate 64-127    Ratings of Perceived Exertion 11-13    Perceived Dyspnea 0-4      Progression   Progression Continue progressive overload as per policy without signs/symptoms or physical distress.      Resistance Training   Training Prescription Yes    Weight 4 lbs    Reps 10-15             Perform Capillary Blood Glucose checks as needed.  Exercise Prescription Changes:   Exercise Prescription Changes     Row Name 08/29/23 1624             Response to Exercise   Blood Pressure (Admit) 118/64       Blood Pressure (Exercise) 148/90       Blood Pressure (Exit) 100/68       Heart Rate (Admit) 74 bpm       Heart Rate (Exercise) 100 bpm       Heart Rate (Exit) 69 bpm       Rating  of Perceived Exertion (Exercise) 11.5       Perceived Dyspnea (Exercise) 0       Symptoms 0       Comments Pt first day in the Pritikin CRP2        Duration Progress to 30 minutes of  aerobic without signs/symptoms of physical distress       Intensity THRR unchanged         Progression   Progression Continue to progress workloads to maintain intensity without signs/symptoms of physical distress.       Average METs 3.3         Resistance Training   Training Prescription Yes       Weight 4 lbs       Reps 10-15       Time 10 Minutes         Recumbant Bike   Level 2       RPM 66       Watts 102       Minutes 15       METs 4         Recumbant Elliptical   Level 2       RPM 80       Watts 28       Minutes 15       METs 2.6                Exercise Comments:   Exercise Comments     Row Name 08/29/23 1634           Exercise Comments Pt first day in the Pritikin ICR program. Pt tolerated exercise well with an average MET level of 3.3. He is off to a good start and is learning his THRR, RPE and ExRx                Exercise Goals and Review:   Exercise Goals     Row Name 08/23/23 1038             Exercise Goals   Increase Physical Activity Yes       Intervention Provide advice, education, support and counseling about physical activity/exercise needs.;Develop an individualized exercise prescription for aerobic and resistive training based on initial evaluation findings, risk stratification, comorbidities and participant's personal goals.       Expected Outcomes Short Term: Attend rehab on a regular basis to increase amount of physical activity.;Long Term: Exercising regularly at least 3-5 days a week.;Long Term: Add in home exercise to make exercise part of routine and to increase amount of physical activity.       Increase Strength and Stamina Yes       Intervention Provide advice, education, support and counseling about physical activity/exercise needs.;Develop an individualized exercise prescription for aerobic and resistive training based on initial evaluation findings, risk stratification, comorbidities  and participant's personal goals.       Expected Outcomes Short Term: Increase workloads from initial exercise prescription for resistance, speed, and METs.;Short Term: Perform resistance training exercises routinely during rehab and add in resistance training at home;Long Term: Improve cardiorespiratory fitness, muscular endurance and strength as measured by increased METs and functional capacity ( )       Able to understand and use rate of perceived exertion (RPE) scale Yes       Intervention Provide education and explanation on how to use RPE scale       Expected Outcomes Short Term: Able to use RPE daily in rehab  to express subjective intensity level;Long Term:  Able to use RPE to guide intensity level when exercising independently       Knowledge and understanding of Target Heart Rate Range (THRR) Yes       Intervention Provide education and explanation of THRR including how the numbers were predicted and where they are located for reference       Expected Outcomes Short Term: Able to state/look up THRR;Long Term: Able to use THRR to govern intensity when exercising independently;Short Term: Able to use daily as guideline for intensity in rehab       Understanding of Exercise Prescription Yes       Intervention Provide education, explanation, and written materials on patient's individual exercise prescription       Expected Outcomes Short Term: Able to explain program exercise prescription;Long Term: Able to explain home exercise prescription to exercise independently                Exercise Goals Re-Evaluation :  Exercise Goals Re-Evaluation     Row Name 08/29/23 1630             Exercise Goal Re-Evaluation   Exercise Goals Review Increase Physical Activity;Understanding of Exercise Prescription;Increase Strength and Stamina;Knowledge and understanding of Target Heart Rate Range (THRR);Able to understand and use rate of perceived exertion (RPE) scale       Comments Pt first day  in the Pritikin ICR program. Pt tolerated exercise well with an average MET level of 3.3. He is off to a good start and is learning his THRR, RPE and ExRx       Expected Outcomes Will continue to monitor pt and progress workloads as tolerated without sign or symptom                Discharge Exercise Prescription (Final Exercise Prescription Changes):  Exercise Prescription Changes - 08/29/23 1624       Response to Exercise   Blood Pressure (Admit) 118/64    Blood Pressure (Exercise) 148/90    Blood Pressure (Exit) 100/68    Heart Rate (Admit) 74 bpm    Heart Rate (Exercise) 100 bpm    Heart Rate (Exit) 69 bpm    Rating of Perceived Exertion (Exercise) 11.5    Perceived Dyspnea (Exercise) 0    Symptoms 0    Comments Pt first day in the Pritikin CRP2    Duration Progress to 30 minutes of  aerobic without signs/symptoms of physical distress    Intensity THRR unchanged      Progression   Progression Continue to progress workloads to maintain intensity without signs/symptoms of physical distress.    Average METs 3.3      Resistance Training   Training Prescription Yes    Weight 4 lbs    Reps 10-15    Time 10 Minutes      Recumbant Bike   Level 2    RPM 66    Watts 102    Minutes 15    METs 4      Recumbant Elliptical   Level 2    RPM 80    Watts 28    Minutes 15    METs 2.6             Nutrition:  Target Goals: Understanding of nutrition guidelines, daily intake of sodium 1500mg , cholesterol 200mg , calories 30% from fat and 7% or less from saturated fats, daily to have 5 or more servings of fruits and vegetables.  Biometrics:  Pre Biometrics - 08/23/23 8295       Pre Biometrics   Waist Circumference 42.5 inches    Hip Circumference 42.5 inches    Waist to Hip Ratio 1 %    Triceps Skinfold 13 mm    % Body Fat 27.9 %    Grip Strength 44 kg    Flexibility 12 in    Single Leg Stand 30 seconds              Nutrition Therapy Plan and Nutrition  Goals:   Nutrition Assessments:  MEDIFICTS Score Key: >=70 Need to make dietary changes  40-70 Heart Healthy Diet <= 40 Therapeutic Level Cholesterol Diet    Picture Your Plate Scores: <62 Unhealthy dietary pattern with much room for improvement. 41-50 Dietary pattern unlikely to meet recommendations for good health and room for improvement. 51-60 More healthful dietary pattern, with some room for improvement.  >60 Healthy dietary pattern, although there may be some specific behaviors that could be improved.    Nutrition Goals Re-Evaluation:   Nutrition Goals Re-Evaluation:   Nutrition Goals Discharge (Final Nutrition Goals Re-Evaluation):   Psychosocial: Target Goals: Acknowledge presence or absence of significant depression and/or stress, maximize coping skills, provide positive support system. Participant is able to verbalize types and ability to use techniques and skills needed for reducing stress and depression.  Initial Review & Psychosocial Screening:  Initial Psych Review & Screening - 08/23/23 1013       Initial Review   Current issues with History of Depression;Current Sleep Concerns   Pt on Lexapro, feels it working. Voices that he does not feel depressed.     Family Dynamics   Good Support System? Yes   Pt has his brother and his good friends Cindee Lame and Rob     Barriers   Psychosocial barriers to participate in program The patient should benefit from training in stress management and relaxation.      Screening Interventions   Interventions Encouraged to exercise             Quality of Life Scores:  Quality of Life - 08/23/23 1033       Quality of Life   Select Quality of Life      Quality of Life Scores   Health/Function Pre 13.8 %    Socioeconomic Pre 23 %    Psych/Spiritual Pre 14.79 %    Family Pre 28 %    GLOBAL Pre 17.36 %            Scores of 19 and below usually indicate a poorer quality of life in these areas.  A difference of   2-3 points is a clinically meaningful difference.  A difference of 2-3 points in the total score of the Quality of Life Index has been associated with significant improvement in overall quality of life, self-image, physical symptoms, and general health in studies assessing change in quality of life.  PHQ-9: Review Flowsheet       08/23/2023  Depression screen PHQ 2/9  Decreased Interest 2  Down, Depressed, Hopeless 0  PHQ - 2 Score 2  Altered sleeping 2  Tired, decreased energy 1  Change in appetite 0  Feeling bad or failure about yourself  0  Trouble concentrating 0  Moving slowly or fidgety/restless 0  Suicidal thoughts 0  PHQ-9 Score 5  Difficult doing work/chores Not difficult at all   Interpretation of Total Score  Total Score Depression Severity:  1-4 = Minimal depression, 5-9 =  Mild depression, 10-14 = Moderate depression, 15-19 = Moderately severe depression, 20-27 = Severe depression   Psychosocial Evaluation and Intervention:   Psychosocial Re-Evaluation:   Psychosocial Discharge (Final Psychosocial Re-Evaluation):   Vocational Rehabilitation: Provide vocational rehab assistance to qualifying candidates.   Vocational Rehab Evaluation & Intervention:  Vocational Rehab - 08/23/23 1015       Initial Vocational Rehab Evaluation & Intervention   Assessment shows need for Vocational Rehabilitation No   Pt is employed            Education: Education Goals: Education classes will be provided on a weekly basis, covering required topics. Participant will state understanding/return demonstration of topics presented.    Education     Row Name 08/29/23 1600     Education   Cardiac Education Topics Pritikin   Geographical information systems officer Psychosocial   Psychosocial Workshop From Head to Heart: The Power of a Healthy Outlook   Instruction Review Code 1- Verbalizes Understanding   Class Start Time 1400   Class  Stop Time 1446   Class Time Calculation (min) 46 min            Core Videos: Exercise    Move It!  Clinical staff conducted group or individual video education with verbal and written material and guidebook.  Patient learns the recommended Pritikin exercise program. Exercise with the goal of living a long, healthy life. Some of the health benefits of exercise include controlled diabetes, healthier blood pressure levels, improved cholesterol levels, improved heart and lung capacity, improved sleep, and better body composition. Everyone should speak with their doctor before starting or changing an exercise routine.  Biomechanical Limitations Clinical staff conducted group or individual video education with verbal and written material and guidebook.  Patient learns how biomechanical limitations can impact exercise and how we can mitigate and possibly overcome limitations to have an impactful and balanced exercise routine.  Body Composition Clinical staff conducted group or individual video education with verbal and written material and guidebook.  Patient learns that body composition (ratio of muscle mass to fat mass) is a key component to assessing overall fitness, rather than body weight alone. Increased fat mass, especially visceral belly fat, can put [place] at increased risk for metabolic syndrome, type 2 diabetes, heart disease, and even death. It is recommended to combine diet and exercise (cardiovascular and resistance training) to improve your body composition. Seek guidance from your physician and exercise physiologist before implementing an exercise routine.  Exercise Action Plan Clinical staff conducted group or individual video education with verbal and written material and guidebook.  Patient learns the recommended strategies to achieve and enjoy long-term exercise adherence, including variety, self-motivation, self-efficacy, and positive decision making. Benefits of exercise include  fitness, good health, weight management, more energy, better sleep, less stress, and overall well-being.  Medical   Heart Disease Risk Reduction Clinical staff conducted group or individual video education with verbal and written material and guidebook.  Patient learns our heart is our most vital organ as it circulates oxygen, nutrients, white blood cells, and hormones throughout the entire body, and carries waste away. Data supports a plant-based eating plan like the Pritikin Program for its effectiveness in slowing progression of and reversing heart disease. The video provides a number of recommendations to address heart disease.   Metabolic Syndrome and Belly Fat  Clinical staff conducted group or individual video education with verbal and written material and  guidebook.  Patient learns what metabolic syndrome is, how it leads to heart disease, and how one can reverse it and keep it from coming back. You have metabolic syndrome if you have 3 of the following 5 criteria: abdominal obesity, high blood pressure, high triglycerides, low HDL cholesterol, and high blood sugar.  Hypertension and Heart Disease Clinical staff conducted group or individual video education with verbal and written material and guidebook.  Patient learns that high blood pressure, or hypertension, is very common in the [place]. Hypertension is largely due to excessive salt intake, but other important risk factors include being overweight, physical inactivity, drinking too much alcohol, smoking, and not eating enough potassium from fruits and vegetables. High blood pressure is a leading risk factor for heart attack, stroke, congestive heart failure, dementia, kidney failure, and premature death. Long-term effects of excessive salt intake include stiffening of the arteries and thickening of heart muscle and organ damage. Recommendations include ways to reduce hypertension and the risk of heart disease.  Diseases of Our Time  - Focusing on Diabetes Clinical staff conducted group or individual video education with verbal and written material and guidebook.  Patient learns why the best way to stop diseases of our time is prevention, through food and other lifestyle changes. Medicine (such as prescription pills and surgeries) is often only a Band-Aid on the problem, not a long-term solution. Most common diseases of our time include obesity, type 2 diabetes, hypertension, heart disease, and cancer. The Pritikin Program is recommended and has been proven to help reduce, reverse, and/or prevent the damaging effects of metabolic syndrome.  Nutrition   Overview of the Pritikin Eating Plan  Clinical staff conducted group or individual video education with verbal and written material and guidebook.  Patient learns about the Pritikin Eating Plan for disease risk reduction. The Pritikin Eating Plan emphasizes a wide variety of unrefined, minimally-processed carbohydrates, like fruits, vegetables, whole grains, and legumes. Go, Caution, and Stop food choices are explained. Plant-based and lean animal proteins are emphasized. Rationale provided for low sodium intake for blood pressure control, low added sugars for blood sugar stabilization, and low added fats and oils for coronary artery disease risk reduction and weight management.  Calorie Density  Clinical staff conducted group or individual video education with verbal and written material and guidebook.  Patient learns about calorie density and how it impacts the Pritikin Eating Plan. Knowing the characteristics of the food you choose will help you decide whether those foods will lead to weight gain or weight loss, and whether you want to consume more or less of them. Weight loss is usually a side effect of the Pritikin Eating Plan because of its focus on low calorie-dense foods.  Label Reading  Clinical staff conducted group or individual video education with verbal and written  material and guidebook.  Patient learns about the Pritikin recommended label reading guidelines and corresponding recommendations regarding calorie density, added sugars, sodium content, and whole grains.  Dining Out - Part 1  Clinical staff conducted group or individual video education with verbal and written material and guidebook.  Patient learns that restaurant meals can be sabotaging because they can be so high in calories, fat, sodium, and/or sugar. Patient learns recommended strategies on how to positively address this and avoid unhealthy pitfalls.  Facts on Fats  Clinical staff conducted group or individual video education with verbal and written material and guidebook.  Patient learns that lifestyle modifications can be just as effective, if not more  so, as many medications for lowering your risk of heart disease. A Pritikin lifestyle can help to reduce your risk of inflammation and atherosclerosis (cholesterol build-up, or plaque, in the artery walls). Lifestyle interventions such as dietary choices and physical activity address the cause of atherosclerosis. A review of the types of fats and their impact on blood cholesterol levels, along with dietary recommendations to reduce fat intake is also included.  Nutrition Action Plan  Clinical staff conducted group or individual video education with verbal and written material and guidebook.  Patient learns how to incorporate Pritikin recommendations into their lifestyle. Recommendations include planning and keeping personal health goals in mind as an important part of their success.  Healthy Mind-Set    Healthy Minds, Bodies, Hearts  Clinical staff conducted group or individual video education with verbal and written material and guidebook.  Patient learns how to identify when they are stressed. Video will discuss the impact of that stress, as well as the many benefits of stress management. Patient will also be introduced to stress management  techniques. The way we think, act, and feel has an impact on our hearts.  How Our Thoughts Can Heal Our Hearts  Clinical staff conducted group or individual video education with verbal and written material and guidebook.  Patient learns that negative thoughts can cause depression and anxiety. This can result in negative lifestyle behavior and serious health problems. Cognitive behavioral therapy is an effective method to help control our thoughts in order to change and improve our emotional outlook.  Additional Videos:  Exercise    Improving Performance  Clinical staff conducted group or individual video education with verbal and written material and guidebook.  Patient learns to use a non-linear approach by alternating intensity levels and lengths of time spent exercising to help burn more calories and lose more body fat. Cardiovascular exercise helps improve heart health, metabolism, hormonal balance, blood sugar control, and recovery from fatigue. Resistance training improves strength, endurance, balance, coordination, reaction time, metabolism, and muscle mass. Flexibility exercise improves circulation, posture, and balance. Seek guidance from your physician and exercise physiologist before implementing an exercise routine and learn your capabilities and proper form for all exercise.  Introduction to Yoga  Clinical staff conducted group or individual video education with verbal and written material and guidebook.  Patient learns about yoga, a discipline of the coming together of mind, breath, and body. The benefits of yoga include improved flexibility, improved range of motion, better posture and core strength, increased lung function, weight loss, and positive self-image. Yoga's heart health benefits include lowered blood pressure, healthier heart rate, decreased cholesterol and triglyceride levels, improved immune function, and reduced stress. Seek guidance from your physician and exercise  physiologist before implementing an exercise routine and learn your capabilities and proper form for all exercise.  Medical   Aging: Enhancing Your Quality of Life  Clinical staff conducted group or individual video education with verbal and written material and guidebook.  Patient learns key strategies and recommendations to stay in good physical health and enhance quality of life, such as prevention strategies, having an advocate, securing a Health Care Proxy and Power of Attorney, and keeping a list of medications and system for tracking them. It also discusses how to avoid risk for bone loss.  Biology of Weight Control  Clinical staff conducted group or individual video education with verbal and written material and guidebook.  Patient learns that weight gain occurs because we consume more calories than we burn (eating more,  moving less). Even if your body weight is normal, you may have higher ratios of fat compared to muscle mass. Too much body fat puts you at increased risk for cardiovascular disease, heart attack, stroke, type 2 diabetes, and obesity-related cancers. In addition to exercise, following the Pritikin Eating Plan can help reduce your risk.  Decoding Lab Results  Clinical staff conducted group or individual video education with verbal and written material and guidebook.  Patient learns that lab test reflects one measurement whose values change over time and are influenced by many factors, including medication, stress, sleep, exercise, food, hydration, pre-existing medical conditions, and more. It is recommended to use the knowledge from this video to become more involved with your lab results and evaluate your numbers to speak with your doctor.   Diseases of Our Time - Overview  Clinical staff conducted group or individual video education with verbal and written material and guidebook.  Patient learns that according to the CDC, 50% to 70% of chronic diseases (such as obesity,  type 2 diabetes, elevated lipids, hypertension, and heart disease) are avoidable through lifestyle improvements including healthier food choices, listening to satiety cues, and increased physical activity.  Sleep Disorders Clinical staff conducted group or individual video education with verbal and written material and guidebook.  Patient learns how good quality and duration of sleep are important to overall health and well-being. Patient also learns about sleep disorders and how they impact health along with recommendations to address them, including discussing with a physician.  Nutrition  Dining Out - Part 2 Clinical staff conducted group or individual video education with verbal and written material and guidebook.  Patient learns how to plan ahead and communicate in order to maximize their dining experience in a healthy and nutritious manner. Included are recommended food choices based on the type of restaurant the patient is visiting.   Fueling a Banker conducted group or individual video education with verbal and written material and guidebook.  There is a strong connection between our food choices and our health. Diseases like obesity and type 2 diabetes are very prevalent and are in large-part due to lifestyle choices. The Pritikin Eating Plan provides plenty of food and hunger-curbing satisfaction. It is easy to follow, affordable, and helps reduce health risks.  Menu Workshop  Clinical staff conducted group or individual video education with verbal and written material and guidebook.  Patient learns that restaurant meals can sabotage health goals because they are often packed with calories, fat, sodium, and sugar. Recommendations include strategies to plan ahead and to communicate with the manager, chef, or server to help order a healthier meal.  Planning Your Eating Strategy  Clinical staff conducted group or individual video education with verbal and written  material and guidebook.  Patient learns about the Pritikin Eating Plan and its benefit of reducing the risk of disease. The Pritikin Eating Plan does not focus on calories. Instead, it emphasizes high-quality, nutrient-rich foods. By knowing the characteristics of the foods, we choose, we can determine their calorie density and make informed decisions.  Targeting Your Nutrition Priorities  Clinical staff conducted group or individual video education with verbal and written material and guidebook.  Patient learns that lifestyle habits have a tremendous impact on disease risk and progression. This video provides eating and physical activity recommendations based on your personal health goals, such as reducing LDL cholesterol, losing weight, preventing or controlling type 2 diabetes, and reducing high blood pressure.  Vitamins and Minerals  Clinical staff conducted group or individual video education with verbal and written material and guidebook.  Patient learns different ways to obtain key vitamins and minerals, including through a recommended healthy diet. It is important to discuss all supplements you take with your doctor.   Healthy Mind-Set    Smoking Cessation  Clinical staff conducted group or individual video education with verbal and written material and guidebook.  Patient learns that cigarette smoking and tobacco addiction pose a serious health risk which affects millions of people. Stopping smoking will significantly reduce the risk of heart disease, lung disease, and many forms of cancer. Recommended strategies for quitting are covered, including working with your doctor to develop a successful plan.  Culinary   Becoming a Set designer conducted group or individual video education with verbal and written material and guidebook.  Patient learns that cooking at home can be healthy, cost-effective, quick, and puts them in control. Keys to cooking healthy recipes will  include looking at your recipe, assessing your equipment needs, planning ahead, making it simple, choosing cost-effective seasonal ingredients, and limiting the use of added fats, salts, and sugars.  Cooking - Breakfast and Snacks  Clinical staff conducted group or individual video education with verbal and written material and guidebook.  Patient learns how important breakfast is to satiety and nutrition through the entire day. Recommendations include key foods to eat during breakfast to help stabilize blood sugar levels and to prevent overeating at meals later in the day. Planning ahead is also a key component.  Cooking - Educational psychologist conducted group or individual video education with verbal and written material and guidebook.  Patient learns eating strategies to improve overall health, including an approach to cook more at home. Recommendations include thinking of animal protein as a side on your plate rather than center stage and focusing instead on lower calorie dense options like vegetables, fruits, whole grains, and plant-based proteins, such as beans. Making sauces in large quantities to freeze for later and leaving the skin on your vegetables are also recommended to maximize your experience.  Cooking - Healthy Salads and Dressing Clinical staff conducted group or individual video education with verbal and written material and guidebook.  Patient learns that vegetables, fruits, whole grains, and legumes are the foundations of the Pritikin Eating Plan. Recommendations include how to incorporate each of these in flavorful and healthy salads, and how to create homemade salad dressings. Proper handling of ingredients is also covered. Cooking - Soups and [employer] - Soups and Desserts Clinical staff conducted group or individual video education with verbal and written material and guidebook.  Patient learns that Pritikin soups and desserts make for easy, nutritious, and  delicious snacks and meal components that are low in sodium, fat, sugar, and calorie density, while high in vitamins, minerals, and filling fiber. Recommendations include simple and healthy ideas for soups and desserts.   Overview     The Pritikin Solution Program Overview Clinical staff conducted group or individual video education with verbal and written material and guidebook.  Patient learns that the results of the Pritikin Program have been documented in more than 100 articles published in peer-reviewed journals, and the benefits include reducing risk factors for (and, in some cases, even reversing) high cholesterol, high blood pressure, type 2 diabetes, obesity, and more! An overview of the three key pillars of the Pritikin Program will be covered: eating well, doing regular exercise, and having a healthy mind-set.  WORKSHOPS  Exercise: Exercise Basics: Building Your Action Plan Clinical staff led group instruction and group discussion with PowerPoint presentation and patient guidebook. To enhance the learning environment the use of posters, models and videos may be added. At the conclusion of this workshop, patients will comprehend the difference between physical activity and exercise, as well as the benefits of incorporating both, into their routine. Patients will understand the FITT (Frequency, Intensity, Time, and Type) principle and how to use it to build an exercise action plan. In addition, safety concerns and other considerations for exercise and cardiac rehab will be addressed by the presenter. The purpose of this lesson is to promote a comprehensive and effective weekly exercise routine in order to improve patients' overall level of fitness.   Managing Heart Disease: Your Path to a Healthier Heart Clinical staff led group instruction and group discussion with PowerPoint presentation and patient guidebook. To enhance the learning environment the use of posters, models and videos  may be added.At the conclusion of this workshop, patients will understand the anatomy and physiology of the heart. Additionally, they will understand how Pritikin's three pillars impact the risk factors, the progression, and the management of heart disease.  The purpose of this lesson is to provide a high-level overview of the heart, heart disease, and how the Pritikin lifestyle positively impacts risk factors.  Exercise Biomechanics Clinical staff led group instruction and group discussion with PowerPoint presentation and patient guidebook. To enhance the learning environment the use of posters, models and videos may be added. Patients will learn how the structural parts of their bodies function and how these functions impact their daily activities, movement, and exercise. Patients will learn how to promote a neutral spine, learn how to manage pain, and identify ways to improve their physical movement in order to promote healthy living. The purpose of this lesson is to expose patients to common physical limitations that impact physical activity. Participants will learn practical ways to adapt and manage aches and pains, and to minimize their effect on regular exercise. Patients will learn how to maintain good posture while sitting, walking, and lifting.  Balance Training and Fall Prevention  Clinical staff led group instruction and group discussion with PowerPoint presentation and patient guidebook. To enhance the learning environment the use of posters, models and videos may be added. At the conclusion of this workshop, patients will understand the importance of their sensorimotor skills (vision, proprioception, and the vestibular system) in maintaining their ability to balance as they age. Patients will apply a variety of balancing exercises that are appropriate for their current level of function. Patients will understand the common causes for poor balance, possible solutions to these  problems, and ways to modify their physical environment in order to minimize their fall risk. The purpose of this lesson is to teach patients about the importance of maintaining balance as they age and ways to minimize their risk of falling.  WORKSHOPS   Nutrition:  Fueling a Ship broker led group instruction and group discussion with PowerPoint presentation and patient guidebook. To enhance the learning environment the use of posters, models and videos may be added. Patients will review the foundational principles of the Pritikin Eating Plan and understand what constitutes a serving size in each of the food groups. Patients will also learn Pritikin-friendly foods that are better choices when away from home and review make-ahead meal and snack options. Calorie density will be reviewed and applied to three nutrition priorities: weight maintenance, weight loss,  and weight gain. The purpose of this lesson is to reinforce (in a group setting) the key concepts around what patients are recommended to eat and how to apply these guidelines when away from home by planning and selecting Pritikin-friendly options. Patients will understand how calorie density may be adjusted for different weight management goals.  Mindful Eating  Clinical staff led group instruction and group discussion with PowerPoint presentation and patient guidebook. To enhance the learning environment the use of posters, models and videos may be added. Patients will briefly review the concepts of the Pritikin Eating Plan and the importance of low-calorie dense foods. The concept of mindful eating will be introduced as well as the importance of paying attention to internal hunger signals. Triggers for non-hunger eating and techniques for dealing with triggers will be explored. The purpose of this lesson is to provide patients with the opportunity to review the basic principles of the Pritikin Eating Plan, discuss the value of  eating mindfully and how to measure internal cues of hunger and fullness using the Hunger Scale. Patients will also discuss reasons for non-hunger eating and learn strategies to use for controlling emotional eating.  Targeting Your Nutrition Priorities Clinical staff led group instruction and group discussion with PowerPoint presentation and patient guidebook. To enhance the learning environment the use of posters, models and videos may be added. Patients will learn how to determine their genetic susceptibility to disease by reviewing their family history. Patients will gain insight into the importance of diet as part of an overall healthy lifestyle in mitigating the impact of genetics and other environmental insults. The purpose of this lesson is to provide patients with the opportunity to assess their personal nutrition priorities by looking at their family history, their own health history and current risk factors. Patients will also be able to discuss ways of prioritizing and modifying the Pritikin Eating Plan for their highest risk areas  Menu  Clinical staff led group instruction and group discussion with PowerPoint presentation and patient guidebook. To enhance the learning environment the use of posters, models and videos may be added. Using menus brought in from [employer], or printed from [employer], patients will apply the Pritikin dining out guidelines that were presented in the [employer] video. Patients will also be able to practice these guidelines in a variety of provided scenarios. The purpose of this lesson is to provide patients with the opportunity to practice hands-on learning of the Pritikin Dining Out guidelines with actual menus and practice scenarios.  Label Reading Clinical staff led group instruction and group discussion with PowerPoint presentation and patient guidebook. To enhance the learning environment the use of posters, models and videos may be  added. Patients will review and discuss the Pritikin label reading guidelines presented in Pritikin's Label Reading Educational series video. Using fool labels brought in from local grocery stores and markets, patients will apply the label reading guidelines and determine if the packaged food meet the Pritikin guidelines. The purpose of this lesson is to provide patients with the opportunity to review, discuss, and practice hands-on learning of the Pritikin Label Reading guidelines with actual packaged food labels. Cooking School  Pritikin's [employer] are designed to teach patients ways to prepare quick, simple, and affordable recipes at home. The importance of nutrition's role in chronic disease risk reduction is reflected in its emphasis in the overall Pritikin program. By learning how to prepare essential core Pritikin Eating Plan recipes, patients will increase control over  what they eat; be able to customize the flavor of foods without the use of added salt, sugar, or fat; and improve the quality of the food they consume. By learning a set of core recipes which are easily assembled, quickly prepared, and affordable, patients are more likely to prepare more healthy foods at home. These workshops focus on convenient breakfasts, simple entres, side dishes, and desserts which can be prepared with minimal effort and are consistent with nutrition recommendations for cardiovascular risk reduction. Cooking [employer] are taught by a Armed forces logistics/support/administrative officer (RD) who has been trained by the [employer]. The chef or RD has a clear understanding of the importance of minimizing - if not completely eliminating - added fat, sugar, and sodium in recipes. Throughout the series of Cooking School Workshop sessions, patients will learn about healthy ingredients and efficient methods of cooking to build confidence in their capability to prepare    Cooking School weekly topics:  Adding  Flavor- Sodium-Free  Fast and Healthy Breakfasts  Powerhouse Plant-Based Proteins  Satisfying Salads and Dressings  Simple Sides and Sauces  International Cuisine-Spotlight on the [employer] Zones  Delicious Desserts  Savory Soups  [employer] - Meals in a Astronomer Appetizers and Snacks  Comforting Weekend Breakfasts  One-Pot Wonders   Fast Evening Meals  Landscape architect Your Pritikin Plate  WORKSHOPS   Healthy Mindset (Psychosocial):  Focused Goals, Sustainable Changes Clinical staff led group instruction and group discussion with PowerPoint presentation and patient guidebook. To enhance the learning environment the use of posters, models and videos may be added. Patients will be able to apply effective goal setting strategies to establish at least one personal goal, and then take consistent, meaningful action toward that goal. They will learn to identify common barriers to achieving personal goals and develop strategies to overcome them. Patients will also gain an understanding of how our mind-set can impact our ability to achieve goals and the importance of cultivating a positive and growth-oriented mind-set. The purpose of this lesson is to provide patients with a deeper understanding of how to set and achieve personal goals, as well as the tools and strategies needed to overcome common obstacles which may arise along the way.  From Head to Heart: The Power of a Healthy Outlook  Clinical staff led group instruction and group discussion with PowerPoint presentation and patient guidebook. To enhance the learning environment the use of posters, models and videos may be added. Patients will be able to recognize and describe the impact of emotions and mood on physical health. They will discover the importance of self-care and explore self-care practices which may work for them. Patients will also learn how to utilize the 4 C's to cultivate a healthier outlook and better  manage stress and challenges. The purpose of this lesson is to demonstrate to patients how a healthy outlook is an essential part of maintaining good health, especially as they continue their cardiac rehab journey.  Healthy Sleep for a Healthy Heart Clinical staff led group instruction and group discussion with PowerPoint presentation and patient guidebook. To enhance the learning environment the use of posters, models and videos may be added. At the conclusion of this workshop, patients will be able to demonstrate knowledge of the importance of sleep to overall health, well-being, and quality of life. They will understand the symptoms of, and treatments for, common sleep disorders. Patients will also be able to identify daytime and nighttime behaviors which impact sleep,  and they will be able to apply these tools to help manage sleep-related challenges. The purpose of this lesson is to provide patients with a general overview of sleep and outline the importance of quality sleep. Patients will learn about a few of the most common sleep disorders. Patients will also be introduced to the concept of "sleep hygiene," and discover ways to self-manage certain sleeping problems through simple daily behavior changes. Finally, the workshop will motivate patients by clarifying the links between quality sleep and their goals of heart-healthy living.   Recognizing and Reducing Stress Clinical staff led group instruction and group discussion with PowerPoint presentation and patient guidebook. To enhance the learning environment the use of posters, models and videos may be added. At the conclusion of this workshop, patients will be able to understand the types of stress reactions, differentiate between acute and chronic stress, and recognize the impact that chronic stress has on their health. They will also be able to apply different coping mechanisms, such as reframing negative self-talk. Patients will have the opportunity  to practice a variety of stress management techniques, such as deep abdominal breathing, progressive muscle relaxation, and/or guided imagery.  The purpose of this lesson is to educate patients on the role of stress in their lives and to provide healthy techniques for coping with it.  Learning Barriers/Preferences:  Learning Barriers/Preferences - 08/23/23 1032       Learning Barriers/Preferences   Learning Barriers Hearing;Sight   wears glasses and bilateral hearing aids   Learning Preferences Audio;Group Instruction;Individual Instruction;Skilled Demonstration;Verbal Instruction             Education Topics:  Knowledge Questionnaire Score:  Knowledge Questionnaire Score - 08/23/23 1032       Knowledge Questionnaire Score   Pre Score 27/28             Core Components/Risk Factors/Patient Goals at Admission:  Personal Goals and Risk Factors at Admission - 08/23/23 1016       Core Components/Risk Factors/Patient Goals on Admission    Weight Management Yes;Weight Loss    Intervention Weight Management: Develop a combined nutrition and exercise program designed to reach desired caloric intake, while maintaining appropriate intake of nutrient and fiber, sodium and fats, and appropriate energy expenditure required for the weight goal.;Weight Management: Provide education and appropriate resources to help participant work on and attain dietary goals.;Weight Management/Obesity: Establish reasonable short term and long term weight goals.    Admit Weight 211 lb 3.2 oz (95.8 kg)    Goal Weight: Short Term 196 lb (88.9 kg)    Expected Outcomes Short Term: Continue to assess and modify interventions until short term weight is achieved;Long Term: Adherence to nutrition and physical activity/exercise program aimed toward attainment of established weight goal;Weight Loss: Understanding of general recommendations for a balanced deficit meal plan, which promotes 1-2 lb weight loss per week and  includes a negative energy balance of [id number] kcal/d;Understanding recommendations for meals to include 15-35% energy as protein, 25-35% energy from fat, 35-60% energy from carbohydrates, less than 200mg  of dietary cholesterol, 20-35 gm of total fiber daily;Understanding of distribution of calorie intake throughout the day with the consumption of 4-5 meals/snacks    Tobacco Cessation Yes    Number of packs per day 0    Intervention Assist the participant in steps to quit. Provide individualized education and counseling about committing to Tobacco Cessation, relapse prevention, and pharmacological support that can be provided by physician.;Education officer, environmental, assist with locating and accessing local/national  Quit Smoking programs, and support quit date choice.   Pt quit smoking 06/27/23   Expected Outcomes Long Term: Complete abstinence from all tobacco products for at least 12 months from quit date.    Hypertension Yes    Intervention Provide education on lifestyle modifcations including regular physical activity/exercise, weight management, moderate sodium restriction and increased consumption of fresh fruit, vegetables, and low fat dairy, alcohol moderation, and smoking cessation.;Monitor prescription use compliance.    Expected Outcomes Short Term: Continued assessment and intervention until BP is < 140/35mm HG in hypertensive participants. < 130/77mm HG in hypertensive participants with diabetes, heart failure or chronic kidney disease.;Long Term: Maintenance of blood pressure at goal levels.    Lipids Yes    Intervention Provide education and support for participant on nutrition & aerobic/resistive exercise along with prescribed medications to achieve LDL 70mg , HDL >40mg .    Expected Outcomes Short Term: Participant states understanding of desired cholesterol values and is compliant with medications prescribed. Participant is following exercise prescription and nutrition guidelines.;Long  Term: Cholesterol controlled with medications as prescribed, with individualized exercise RX and with personalized nutrition plan. Value goals: LDL < 70mg , HDL > 40 mg.             Core Components/Risk Factors/Patient Goals Review:    Core Components/Risk Factors/Patient Goals at Discharge (Final Review):    ITP Comments:  ITP Comments     Row Name 08/23/23 1008 08/29/23 1715         ITP Comments Armanda Magic, MD: Medical Director.  Introduction to the [facility]/Intensive Cardiac Rehab.  Initial orientation packet reviewed witht he patient. 30 Day ITP Review. Vinny started cardiac rehab on 08/29/23. Vinney did well with exercise.               Comments: Pt started cardiac rehab today.  Pt tolerated light exercise without difficulty. VSS, telemetry-Sinus Rhythm, asymptomatic.  Medication list reconciled. Pt denies barriers to medicaiton compliance.  PSYCHOSOCIAL ASSESSMENT:  PHQ-5. Pt exhibits positive coping skills, hopeful outlook with supportive family. No psychosocial needs identified at this time, no psychosocial interventions necessary.    Pt enjoys golfing, hanging out with friends and hockey.   Pt oriented to exercise equipment and routine.    Understanding verbalized. Thayer Headings RN BSN

## 2023-08-31 ENCOUNTER — Encounter (HOSPITAL_COMMUNITY)
Admission: RE | Admit: 2023-08-31 | Discharge: 2023-08-31 | Discharge status: Home / Self Care | Payer: BC Managed Care – PPO | Source: Ambulatory Visit | Attending: Cardiology

## 2023-08-31 ENCOUNTER — Ambulatory Visit (HOSPITAL_COMMUNITY): Payer: BC Managed Care – PPO

## 2023-08-31 DIAGNOSIS — I252 Old myocardial infarction: Secondary | ICD-10-CM | POA: Diagnosis not present

## 2023-08-31 DIAGNOSIS — Z48812 Encounter for surgical aftercare following surgery on the circulatory system: Secondary | ICD-10-CM | POA: Diagnosis not present

## 2023-08-31 DIAGNOSIS — Z955 Presence of coronary angioplasty implant and graft: Secondary | ICD-10-CM | POA: Diagnosis not present

## 2023-08-31 DIAGNOSIS — I2111 ST elevation (STEMI) myocardial infarction involving right coronary artery: Secondary | ICD-10-CM

## 2023-08-31 DIAGNOSIS — Z87891 Personal history of nicotine dependence: Secondary | ICD-10-CM | POA: Diagnosis not present

## 2023-09-02 ENCOUNTER — Ambulatory Visit (HOSPITAL_COMMUNITY): Payer: BC Managed Care – PPO

## 2023-09-02 ENCOUNTER — Encounter (HOSPITAL_COMMUNITY)
Admission: RE | Admit: 2023-09-02 | Discharge: 2023-09-02 | Disposition: A | Discharge status: Home / Self Care | Payer: BC Managed Care – PPO | Source: Ambulatory Visit | Attending: Cardiology | Admitting: Cardiology

## 2023-09-02 DIAGNOSIS — Z955 Presence of coronary angioplasty implant and graft: Secondary | ICD-10-CM

## 2023-09-02 DIAGNOSIS — I252 Old myocardial infarction: Secondary | ICD-10-CM | POA: Diagnosis not present

## 2023-09-02 DIAGNOSIS — Z87891 Personal history of nicotine dependence: Secondary | ICD-10-CM | POA: Diagnosis not present

## 2023-09-02 DIAGNOSIS — I2111 ST elevation (STEMI) myocardial infarction involving right coronary artery: Secondary | ICD-10-CM

## 2023-09-02 DIAGNOSIS — Z48812 Encounter for surgical aftercare following surgery on the circulatory system: Secondary | ICD-10-CM | POA: Diagnosis not present

## 2023-09-05 ENCOUNTER — Ambulatory Visit (HOSPITAL_COMMUNITY): Payer: BC Managed Care – PPO

## 2023-09-05 ENCOUNTER — Encounter (HOSPITAL_COMMUNITY)
Admission: RE | Admit: 2023-09-05 | Discharge: 2023-09-05 | Disposition: A | Discharge status: Home / Self Care | Payer: BC Managed Care – PPO | Source: Ambulatory Visit | Attending: Cardiology

## 2023-09-05 DIAGNOSIS — Z48812 Encounter for surgical aftercare following surgery on the circulatory system: Secondary | ICD-10-CM | POA: Diagnosis not present

## 2023-09-05 DIAGNOSIS — I252 Old myocardial infarction: Secondary | ICD-10-CM | POA: Diagnosis not present

## 2023-09-05 DIAGNOSIS — Z955 Presence of coronary angioplasty implant and graft: Secondary | ICD-10-CM

## 2023-09-05 DIAGNOSIS — I2111 ST elevation (STEMI) myocardial infarction involving right coronary artery: Secondary | ICD-10-CM

## 2023-09-05 DIAGNOSIS — Z87891 Personal history of nicotine dependence: Secondary | ICD-10-CM | POA: Diagnosis not present

## 2023-09-07 ENCOUNTER — Ambulatory Visit (HOSPITAL_COMMUNITY): Payer: BC Managed Care – PPO

## 2023-09-07 ENCOUNTER — Encounter (HOSPITAL_COMMUNITY)
Admission: RE | Admit: 2023-09-07 | Discharge: 2023-09-07 | Disposition: A | Discharge status: Home / Self Care | Payer: BC Managed Care – PPO | Source: Ambulatory Visit | Attending: Cardiology

## 2023-09-07 DIAGNOSIS — Z955 Presence of coronary angioplasty implant and graft: Secondary | ICD-10-CM

## 2023-09-07 DIAGNOSIS — Z48812 Encounter for surgical aftercare following surgery on the circulatory system: Secondary | ICD-10-CM | POA: Diagnosis not present

## 2023-09-07 DIAGNOSIS — Z87891 Personal history of nicotine dependence: Secondary | ICD-10-CM | POA: Diagnosis not present

## 2023-09-07 DIAGNOSIS — I2111 ST elevation (STEMI) myocardial infarction involving right coronary artery: Secondary | ICD-10-CM

## 2023-09-07 DIAGNOSIS — I252 Old myocardial infarction: Secondary | ICD-10-CM | POA: Diagnosis not present

## 2023-09-09 ENCOUNTER — Encounter (HOSPITAL_COMMUNITY)
Admission: RE | Admit: 2023-09-09 | Discharge: 2023-09-09 | Disposition: A | Discharge status: Home / Self Care | Payer: BC Managed Care – PPO | Source: Ambulatory Visit | Attending: Cardiology | Admitting: Cardiology

## 2023-09-09 ENCOUNTER — Ambulatory Visit (HOSPITAL_COMMUNITY): Payer: BC Managed Care – PPO

## 2023-09-09 DIAGNOSIS — Z48812 Encounter for surgical aftercare following surgery on the circulatory system: Secondary | ICD-10-CM | POA: Diagnosis not present

## 2023-09-09 DIAGNOSIS — Z955 Presence of coronary angioplasty implant and graft: Secondary | ICD-10-CM

## 2023-09-09 DIAGNOSIS — I252 Old myocardial infarction: Secondary | ICD-10-CM | POA: Diagnosis not present

## 2023-09-09 DIAGNOSIS — I2111 ST elevation (STEMI) myocardial infarction involving right coronary artery: Secondary | ICD-10-CM

## 2023-09-09 DIAGNOSIS — Z87891 Personal history of nicotine dependence: Secondary | ICD-10-CM | POA: Diagnosis not present

## 2023-09-12 ENCOUNTER — Ambulatory Visit (HOSPITAL_COMMUNITY): Payer: BC Managed Care – PPO

## 2023-09-12 ENCOUNTER — Encounter (HOSPITAL_COMMUNITY)
Admission: RE | Admit: 2023-09-12 | Discharge: 2023-09-12 | Disposition: A | Discharge status: Home / Self Care | Payer: BC Managed Care – PPO | Source: Ambulatory Visit | Attending: Cardiology

## 2023-09-12 DIAGNOSIS — I2111 ST elevation (STEMI) myocardial infarction involving right coronary artery: Secondary | ICD-10-CM

## 2023-09-12 DIAGNOSIS — I252 Old myocardial infarction: Secondary | ICD-10-CM | POA: Diagnosis not present

## 2023-09-12 DIAGNOSIS — Z48812 Encounter for surgical aftercare following surgery on the circulatory system: Secondary | ICD-10-CM | POA: Diagnosis not present

## 2023-09-12 DIAGNOSIS — Z955 Presence of coronary angioplasty implant and graft: Secondary | ICD-10-CM | POA: Diagnosis not present

## 2023-09-12 DIAGNOSIS — Z87891 Personal history of nicotine dependence: Secondary | ICD-10-CM | POA: Diagnosis not present

## 2023-09-12 NOTE — Progress Notes (Signed)
CARDIAC REHAB PHASE 2  Reviewed home exercise with pt today. Pt is tolerating exercise well. Pt will continue to exercise on his own by online chair exercise program and stretches for 30-45 minutes per session 2 days a week in addition to the 3 days in CRP2. Advised pt on THRR, RPE scale, hydration and temperature/humidity precautions. Reinforced S/S to stop exercise and when to call MD vs 911. Encouraged warm up cool down and stretches with exercise sessions. Pt verbalized understanding, all questions were answered and pt was given a copy to take home.    Harrie Jeans ACSM-CEP 09/12/2023 4:30 PM

## 2023-09-14 ENCOUNTER — Encounter (HOSPITAL_COMMUNITY): Payer: BC Managed Care – PPO

## 2023-09-14 ENCOUNTER — Ambulatory Visit (HOSPITAL_COMMUNITY): Payer: BC Managed Care – PPO

## 2023-09-16 ENCOUNTER — Ambulatory Visit (HOSPITAL_COMMUNITY): Payer: BC Managed Care – PPO

## 2023-09-16 ENCOUNTER — Encounter (HOSPITAL_COMMUNITY)
Admission: RE | Admit: 2023-09-16 | Discharge: 2023-09-16 | Disposition: A | Discharge status: Home / Self Care | Payer: BC Managed Care – PPO | Source: Ambulatory Visit | Attending: Cardiology | Admitting: Cardiology

## 2023-09-16 DIAGNOSIS — Z955 Presence of coronary angioplasty implant and graft: Secondary | ICD-10-CM

## 2023-09-16 DIAGNOSIS — Z87891 Personal history of nicotine dependence: Secondary | ICD-10-CM | POA: Diagnosis not present

## 2023-09-16 DIAGNOSIS — I252 Old myocardial infarction: Secondary | ICD-10-CM | POA: Diagnosis not present

## 2023-09-16 DIAGNOSIS — Z48812 Encounter for surgical aftercare following surgery on the circulatory system: Secondary | ICD-10-CM | POA: Diagnosis not present

## 2023-09-16 DIAGNOSIS — I2111 ST elevation (STEMI) myocardial infarction involving right coronary artery: Secondary | ICD-10-CM

## 2023-09-19 ENCOUNTER — Encounter (HOSPITAL_COMMUNITY)
Admission: RE | Admit: 2023-09-19 | Discharge: 2023-09-19 | Disposition: A | Discharge status: Home / Self Care | Payer: BC Managed Care – PPO | Source: Ambulatory Visit | Attending: Cardiology | Admitting: Cardiology

## 2023-09-19 ENCOUNTER — Ambulatory Visit (HOSPITAL_COMMUNITY): Payer: BC Managed Care – PPO

## 2023-09-19 DIAGNOSIS — Z48812 Encounter for surgical aftercare following surgery on the circulatory system: Secondary | ICD-10-CM | POA: Diagnosis not present

## 2023-09-19 DIAGNOSIS — Z87891 Personal history of nicotine dependence: Secondary | ICD-10-CM | POA: Diagnosis not present

## 2023-09-19 DIAGNOSIS — Z955 Presence of coronary angioplasty implant and graft: Secondary | ICD-10-CM | POA: Diagnosis not present

## 2023-09-19 DIAGNOSIS — I2111 ST elevation (STEMI) myocardial infarction involving right coronary artery: Secondary | ICD-10-CM

## 2023-09-19 DIAGNOSIS — I252 Old myocardial infarction: Secondary | ICD-10-CM | POA: Diagnosis not present

## 2023-09-21 ENCOUNTER — Ambulatory Visit (HOSPITAL_COMMUNITY): Payer: BC Managed Care – PPO

## 2023-09-21 ENCOUNTER — Encounter (HOSPITAL_COMMUNITY): Payer: BC Managed Care – PPO

## 2023-09-21 ENCOUNTER — Telehealth (HOSPITAL_COMMUNITY): Payer: Self-pay | Admitting: *Deleted

## 2023-09-21 NOTE — Telephone Encounter (Signed)
 Tommy Page left a voice mail message. Out today due to a head cold. [name] will return Friday.

## 2023-09-23 ENCOUNTER — Ambulatory Visit (HOSPITAL_COMMUNITY): Payer: BC Managed Care – PPO

## 2023-09-23 ENCOUNTER — Encounter (HOSPITAL_COMMUNITY)
Admission: RE | Admit: 2023-09-23 | Discharge: 2023-09-23 | Disposition: A | Discharge status: Home / Self Care | Payer: BC Managed Care – PPO | Source: Ambulatory Visit | Attending: Cardiology | Admitting: Cardiology

## 2023-09-23 DIAGNOSIS — I2111 ST elevation (STEMI) myocardial infarction involving right coronary artery: Secondary | ICD-10-CM

## 2023-09-23 DIAGNOSIS — Z48812 Encounter for surgical aftercare following surgery on the circulatory system: Secondary | ICD-10-CM | POA: Diagnosis not present

## 2023-09-23 DIAGNOSIS — Z955 Presence of coronary angioplasty implant and graft: Secondary | ICD-10-CM

## 2023-09-23 DIAGNOSIS — I252 Old myocardial infarction: Secondary | ICD-10-CM | POA: Diagnosis not present

## 2023-09-23 DIAGNOSIS — Z87891 Personal history of nicotine dependence: Secondary | ICD-10-CM | POA: Diagnosis not present

## 2023-09-26 ENCOUNTER — Ambulatory Visit (HOSPITAL_COMMUNITY): Payer: BC Managed Care – PPO

## 2023-09-26 ENCOUNTER — Encounter (HOSPITAL_COMMUNITY)
Admission: RE | Admit: 2023-09-26 | Discharge: 2023-09-26 | Disposition: A | Discharge status: Home / Self Care | Payer: BC Managed Care – PPO | Source: Ambulatory Visit | Attending: Cardiology | Admitting: Cardiology

## 2023-09-26 DIAGNOSIS — I252 Old myocardial infarction: Secondary | ICD-10-CM | POA: Insufficient documentation

## 2023-09-26 DIAGNOSIS — I2111 ST elevation (STEMI) myocardial infarction involving right coronary artery: Secondary | ICD-10-CM

## 2023-09-26 DIAGNOSIS — Z955 Presence of coronary angioplasty implant and graft: Secondary | ICD-10-CM | POA: Insufficient documentation

## 2023-09-26 DIAGNOSIS — Z48812 Encounter for surgical aftercare following surgery on the circulatory system: Secondary | ICD-10-CM | POA: Insufficient documentation

## 2023-09-27 NOTE — Progress Notes (Signed)
 Cardiac Individual Treatment Plan  Patient Details  Name: Tommy Page MRN: [id number] Date of Birth: [birth year] Referring Provider:   Flowsheet Row INTENSIVE CARDIAC REHAB ORIENT from 08/23/2023 in [facility]  Referring Provider Dr. 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 (HCC)  06/27/23 Status post coronary artery stent placement  Patient's Home Medications on Admission:  Current Outpatient Medications:    ASPIRIN LOW DOSE 81 MG tablet, Take 81 mg by mouth daily., Disp: , Rfl:    BRILINTA 90 MG TABS tablet, Take 90 mg by mouth 2 (two) times daily., Disp: , Rfl:    escitalopram (LEXAPRO) 20 MG tablet, Take 20 mg by mouth daily., Disp: , Rfl:    Fluticasone-Umeclidin-Vilant (TRELEGY ELLIPTA) 100-62.5-25 MCG/ACT AEPB, Inhale 100 mcg into the lungs once as needed., Disp: , Rfl:    hydrocortisone (ANUSOL-HC) 2.5 % rectal cream, Place 1 application rectally 2 (two) times daily. Apply around anus for irritated & painful hemorrhoids, Disp: 15 g, Rfl: 2   metoprolol succinate (TOPROL-XL) 25 MG 24 hr tablet, Take 12.5 mg by mouth daily., Disp: , Rfl:    nicotine (NICODERM CQ - DOSED IN MG/24 HOURS) 21 mg/24hr patch, Place 21 mg onto the skin daily., Disp: , Rfl:    nicotine polacrilex (NICORETTE) 2 MG gum, Take 2 mg by mouth as needed for smoking cessation., Disp: , Rfl:    omeprazole (PRILOSEC OTC) 20 MG tablet, Take 20 mg by mouth daily., Disp: , Rfl:    PROBIOTIC PRODUCT PO, Take by mouth daily., Disp: , Rfl:    rosuvastatin (CRESTOR) 20 MG tablet, Take 20 mg by mouth daily., Disp: , Rfl:    simvastatin (ZOCOR) 20 MG tablet, Take 20 mg by mouth at bedtime.  , Disp: , Rfl:    traMADol (ULTRAM) 50 MG tablet, Take 1-2 tablets (50-100 mg total) by mouth every 6 (six) hours as needed for moderate pain or severe pain., Disp: 40 tablet, Rfl: 0   Wheat Dextrin (BENEFIBER DRINK MIX) PACK, Take 3 g by mouth daily., Disp: , Rfl:   Past Medical History: Past Medical History:  Diagnosis Date   Anxiety    Depression    GERD (gastroesophageal reflux disease)    Hemorrhoids    HOH (hard of hearing)    BOTH EARS    Tobacco Use: Social History   Tobacco Use  Smoking Status Every Day   Current packs/day: 0.00   Average packs/day: 1 pack/day for 36.0 years (36.0 ttl pk-yrs)   Types: Cigarettes   Start date: 06/26/1979   Last attempt to quit: 06/26/2015   Years since quitting: 8.2  Smokeless Tobacco Never    Labs: Review Flowsheet       Latest Ref Rng & Units 12/16/2015  Labs for ITP Cardiac and Pulmonary Rehab  TCO2 0 - 100 mmol/L 21     Capillary Blood Glucose: Lab Results  Component Value Date   GLUCAP 117 (H) 12/10/2015     Exercise Target Goals: Exercise Program Goal: Individual exercise prescription set using results from initial 6 min walk test and THRR while considering  patient's activity barriers and safety.   Exercise Prescription Goal: Initial exercise prescription builds  to 30-45 minutes a day of aerobic activity, 2-3 days per week.  Home exercise guidelines will be given to patient during program as part of exercise prescription that the participant will acknowledge.  Activity Barriers & Risk Stratification:  Activity Barriers & Cardiac Risk Stratification - 08/23/23 1037       Activity Barriers & Cardiac Risk Stratification   Activity Barriers Back Problems;Neck/Spine Problems    Cardiac Risk Stratification High             6 Minute Walk:  6 Minute Walk     Row Name 08/23/23 0929         6 Minute Walk   Phase Initial     Distance 1755 feet     Walk Time 6 minutes     # of Rest Breaks 0     MPH 3.32      METS 4.2     RPE 8     Perceived Dyspnea  0     VO2 Peak 14.7     Symptoms No     Resting HR 77 bpm     Resting BP 112/70     Resting Oxygen Saturation  96 %     Exercise Oxygen Saturation  during 6 min walk 97 %     Max Ex. HR 95 bpm     Max Ex. BP 138/72     2 Minute Post BP 112/68              Oxygen Initial Assessment:   Oxygen Re-Evaluation:   Oxygen Discharge (Final Oxygen Re-Evaluation):   Initial Exercise Prescription:  Initial Exercise Prescription - 08/23/23 1000       Date of Initial Exercise RX and Referring Provider   Date 08/23/23    Referring Provider Dr. Consepcion Hearing, MD covering)    Expected Discharge Date 11/16/23      Recumbant Bike   Level 2    RPM 60    Watts 25    Minutes 15    METs 4.2      Recumbant Elliptical   Level 2    RPM 60    Watts 50    Minutes 15    METs 4.2      Prescription Details   Frequency (times per week) 3    Duration Progress to 30 minutes of continuous aerobic without signs/symptoms of physical distress      Intensity   THRR 40-80% of Max Heartrate 64-127    Ratings of Perceived Exertion 11-13    Perceived Dyspnea 0-4      Progression   Progression Continue progressive overload as per policy without signs/symptoms or physical distress.      Resistance Training   Training Prescription Yes    Weight 4 lbs    Reps 10-15             Perform Capillary Blood Glucose checks as needed.  Exercise Prescription Changes:   Exercise Prescription Changes     Row Name 08/29/23 1624 09/12/23 1625           Response to Exercise   Blood Pressure (Admit) 118/64 118/60      Blood Pressure (Exercise) 148/90 156/80      Blood Pressure (Exit) 100/68 104/68      Heart Rate (Admit) 74 bpm 70 bpm      Heart Rate (Exercise) 100 bpm 98 bpm      Heart Rate (Exit) 69 bpm 75 bpm  Rating of Perceived Exertion (Exercise) 11.5 10.5      Perceived Dyspnea (Exercise) 0 0      Symptoms 0 0       Comments Pt first day in the [facility]rit[facility] CRP2 Reviewed MET's, goals and home      Duration Progress to 30 minutes of  aerobic without signs/symptoms of physical distress Progress to 30 minutes of  aerobic without signs/symptoms of physical distress      Intensity THRR unchanged THRR unchanged        Progression   Progression Continue to progress workloads to maintain intensity without signs/symptoms of physical distress. Continue to progress workloads to maintain intensity without signs/symptoms of physical distress.      Average METs 3.3 4.35        Resistance Training   Training Prescription Yes Yes      Weight 4 lbs 4 lbs      Reps 10-15 10-15      Time 10 Minutes 10 Minutes        Recumbant Bike   Level 2 3      RPM 66 82      Watts 102 55      Minutes 15 15      METs 4 3.8        Recumbant Elliptical   Level 2 3      RPM 80 80      Watts 28 122      Minutes 15 15      METs 2.6 4.9        Home Exercise Plan   Plans to continue exercise at -- Home (comment)      Frequency -- Add 2 additional days to program exercise sessions.      Initial Home Exercises Provided -- 09/12/23               Exercise Comments:   Exercise Comments     Row Name 08/29/23 1634 09/12/23 1629         Exercise Comments Pt first day in the Prit[facility]in ICR program. Pt tolerated exercise well with an average MET level of 3.3. He is off to a good start and is learning his THRR, RPE and ExRx Reviewed MET's, goals and home ExRx. Pt tolerated exercise well with an average MET level of 4.35. He is already progressing MET's and WL's and feels good with his exercie routine. He feels good about his goals and has been working on weight loss since the summer and is doing well, he states he's having a hard time with heart healthy eating, but is making changes.               Exercise Goals and Review:   Exercise Goals     Row Name 08/23/23 1038             Exercise Goals   Increase Physical  Activity Yes       Intervention Provide advice, education, support and counseling about physical activity/exercise needs.;Develop an individualized exercise prescription for aerobic and resistive training based on initial evaluation findings, risk stratification, comorbidities and participant's personal goals.       Expected Outcomes Short Term: Attend rehab on a regular basis to increase amount of physical activity.;Long Term: Exercising regularly at least 3-5 days a week.;Long Term: Add in home exercise to make exercise part of routine and to increase amount of physical activity.       Increase Strength and Stamina Yes  Intervention Provide advice, education, support and counseling about physical activity/exercise needs.;Develop an individualized exercise prescription for aerobic and resistive training based on initial evaluation findings, risk stratification, comorbidities and participant's personal goals.       Expected Outcomes Short Term: Increase workloads from initial exercise prescription for resistance, speed, and METs.;Short Term: Perform resistance training exercises routinely during rehab and add in resistance training at home;Long Term: Improve cardiorespiratory fitness, muscular endurance and strength as measured by increased METs and functional capacity ( )       Able to understand and use rate of perceived exertion (RPE) scale Yes       Intervention Provide education and explanation on how to use RPE scale       Expected Outcomes Short Term: Able to use RPE daily in rehab to express subjective intensity level;Long Term:  Able to use RPE to guide intensity level when exercising independently       Knowledge and understanding of Target Heart Rate Range (THRR) Yes       Intervention Provide education and explanation of THRR including how the numbers were predicted and where they are located for reference       Expected Outcomes Short Term: Able to state/look up THRR;Long Term: Able  to use THRR to govern intensity when exercising independently;Short Term: Able to use daily as guideline for intensity in rehab       Understanding of Exercise Prescription Yes       Intervention Provide education, explanation, and written materials on patient's individual exercise prescription       Expected Outcomes Short Term: Able to explain program exercise prescription;Long Term: Able to explain home exercise prescription to exercise independently                Exercise Goals Re-Evaluation :  Exercise Goals Re-Evaluation     Row Name 08/29/23 1630 09/12/23 1627           Exercise Goal Re-Evaluation   Exercise Goals Review Increase Physical Activity;Understanding of Exercise Prescription;Increase Strength and Stamina;Knowledge and understanding of Target Heart Rate Range (THRR);Able to understand and use rate of perceived exertion (RPE) scale Increase Physical Activity;Understanding of Exercise Prescription;Increase Strength and Stamina;Knowledge and understanding of Target Heart Rate Range (THRR);Able to understand and use rate of perceived exertion (RPE) scale      Comments Pt first day in the [facility] ICR program. Pt tolerated exercise well with an average MET level of 3.3. He is off to a good start and is learning his THRR, RPE and ExRx Reviewed MET's, goals and home ExRx. Pt tolerated exercise well with an average MET level of 4.35. He is already progressing MET's and WL's and feels good with his exercie routine. He feels good about his goals and has been working on weight loss since the summer and is doing well, he states he's having a hard time with heart healthy eating, but is making changes.      Expected Outcomes Will continue to monitor pt and progress workloads as tolerated without sign or symptom Will continue to monitor pt and progress workloads as tolerated without sign or symptom               Discharge Exercise Prescription (Final Exercise Prescription Changes):   Exercise Prescription Changes - 09/12/23 1625       Response to Exercise   Blood Pressure (Admit) 118/60    Blood Pressure (Exercise) 156/80    Blood Pressure (Exit) 104/68    Heart Rate (Admit)  70 bpm    Heart Rate (Exercise) 98 bpm    Heart Rate (Exit) 75 bpm    Rating of Perceived Exertion (Exercise) 10.5    Perceived Dyspnea (Exercise) 0    Symptoms 0    Comments Reviewed MET's, goals and home    Duration Progress to 30 minutes of  aerobic without signs/symptoms of physical distress    Intensity THRR unchanged      Progression   Progression Continue to progress workloads to maintain intensity without signs/symptoms of physical distress.    Average METs 4.35      Resistance Training   Training Prescription Yes    Weight 4 lbs    Reps 10-15    Time 10 Minutes      Recumbant Bike   Level 3    RPM 82    Watts 55    Minutes 15    METs 3.8      Recumbant Elliptical   Level 3    RPM 80    Watts 122    Minutes 15    METs 4.9      Home Exercise Plan   Plans to continue exercise at Home (comment)    Frequency Add 2 additional days to program exercise sessions.    Initial Home Exercises Provided 09/12/23             Nutrition:  Target Goals: Understanding of nutrition guidelines, daily intake of sodium 1500mg , cholesterol 200mg , calories 30% from fat and 7% or less from saturated fats, daily to have 5 or more servings of fruits and vegetables.  Biometrics:  Pre Biometrics - 08/23/23 1610       Pre Biometrics   Waist Circumference 42.5 inches    Hip Circumference 42.5 inches    Waist to Hip Ratio 1 %    Triceps Skinfold 13 mm    % Body Fat 27.9 %    Grip Strength 44 kg    Flexibility 12 in    Single Leg Stand 30 seconds              Nutrition Therapy Plan and Nutrition Goals:   Nutrition Assessments:  Nutrition Assessments - 09/08/23 0909       Rate Your Plate Scores   Pre Score 53            MEDIFICTS Score Key: >=70 Need to make  dietary changes  40-70 Heart Healthy Diet <= 40 Therapeutic Level Cholesterol Diet   Flowsheet Row INTENSIVE CARDIAC REHAB from 09/07/2023 in [facility]  Picture Your Plate Total Score on Admission 53      Picture Your Plate Scores: <96 Unhealthy dietary pattern with much room for improvement. 41-50 Dietary pattern unlikely to meet recommendations for good health and room for improvement. 51-60 More healthful dietary pattern, with some room for improvement.  >60 Healthy dietary pattern, although there may be some specific behaviors that could be improved.    Nutrition Goals Re-Evaluation:   Nutrition Goals Re-Evaluation:   Nutrition Goals Discharge (Final Nutrition Goals Re-Evaluation):   Psychosocial: Target Goals: Acknowledge presence or absence of significant depression and/or stress, maximize coping skills, provide positive support system. Participant is able to verbalize types and ability to use techniques and skills needed for reducing stress and depression.  Initial Review & Psychosocial Screening:  Initial Psych Review & Screening - 08/23/23 1013       Initial Review   Current issues with  History of Depression;Current Sleep Concerns   Pt on Lexapro, feels it working. Voices that he does not feel depressed.     Family Dynamics   Good Support System? Yes   Pt has his brother and his good friends Cindee Lame and Rob     Barriers   Psychosocial barriers to participate in program The patient should benefit from training in stress management and relaxation.      Screening Interventions   Interventions Encouraged to exercise             Quality of Life Scores:  Quality of Life - 08/23/23 1033       Quality of Life   Select Quality of Life      Quality of Life Scores   Health/Function Pre 13.8 %    Socioeconomic Pre 23 %    Psych/Spiritual Pre 14.79 %    Family Pre 28 %    GLOBAL Pre 17.36 %             Scores of 19 and below usually indicate a poorer quality of life in these areas.  A difference of  2-3 points is a clinically meaningful difference.  A difference of 2-3 points in the total score of the Quality of Life Index has been associated with significant improvement in overall quality of life, self-image, physical symptoms, and general health in studies assessing change in quality of life.  PHQ-9: Review Flowsheet       08/23/2023  Depression screen PHQ 2/9  Decreased Interest 2  Down, Depressed, Hopeless 0  PHQ - 2 Score 2  Altered sleeping 2  Tired, decreased energy 1  Change in appetite 0  Feeling bad or failure about yourself  0  Trouble concentrating 0  Moving slowly or fidgety/restless 0  Suicidal thoughts 0  PHQ-9 Score 5  Difficult doing work/chores Not difficult at all   Interpretation of Total Score  Total Score Depression Severity:  1-4 = Minimal depression, 5-9 = Mild depression, 10-14 = Moderate depression, 15-19 = Moderately severe depression, 20-27 = Severe depression   Psychosocial Evaluation and Intervention:   Psychosocial Re-Evaluation:  Psychosocial Re-Evaluation     Row Name 09/27/23 1339             Psychosocial Re-Evaluation   Current issues with History of Depression;Current Sleep Concerns       Comments Will review PHq2-9 and quality of life questionnaire in the upcoming week       Expected Outcomes Tommy Page will have controlled or decreased depression upon completion of cardiac rehab.       Interventions Stress management education;Relaxation education;Encouraged to attend Cardiac Rehabilitation for the exercise       Continue Psychosocial Services  Follow up required by staff                Psychosocial Discharge (Final Psychosocial Re-Evaluation):  Psychosocial Re-Evaluation - 09/27/23 1339       Psychosocial Re-Evaluation   Current issues with History of Depression;Current Sleep Concerns    Comments Will review PHq2-9  and quality of life questionnaire in the upcoming week    Expected Outcomes Tommy Page will have controlled or decreased depression upon completion of cardiac rehab.    Interventions Stress management education;Relaxation education;Encouraged to attend Cardiac Rehabilitation for the exercise    Continue Psychosocial Services  Follow up required by staff             Vocational Rehabilitation: Provide vocational rehab assistance to qualifying candidates.  Vocational Rehab Evaluation & Intervention:  Vocational Rehab - 08/23/23 1015       Initial Vocational Rehab Evaluation & Intervention   Assessment shows need for Vocational Rehabilitation No   Pt is employed            Education: Education Goals: Education classes will be provided on a weekly basis, covering required topics. Participant will state understanding/return demonstration of topics presented.    Education     Row Name 08/29/23 1600     Education   Cardiac Education Topics Pritikin   Geographical information systems officer Psychosocial   Psychosocial Workshop From Head to Heart: The Power of a Healthy Outlook   Instruction Review Code 1- Verbalizes Understanding   Class Start Time 1400   Class Stop Time 1446   Class Time Calculation (min) 46 min    Row Name 08/31/23 1400     Education   Cardiac Education Topics Pritikin   Secondary school teacher School   Educator Nurse;Respiratory Therapist   Weekly Topic Powerhouse Plant-Based Proteins   Instruction Review Code 1- Verbalizes Understanding   Class Start Time 1358   Class Stop Time 1430   Class Time Calculation (min) 32 min    Row Name 09/02/23 1300     Education   Cardiac Education Topics Pritikin   Hospital doctor Education   General Education Heart Disease Risk Reduction   Instruction Review Code 1- Verbalizes Understanding    Class Start Time 1350   Class Stop Time 1430   Class Time Calculation (min) 40 min    Row Name 09/05/23 1400     Education   Cardiac Education Topics Pritikin   Select Workshops     Workshops   Educator Exercise Physiologist   Select Exercise   Exercise Workshop Location manager and Fall Prevention   Instruction Review Code 1- Verbalizes Understanding   Class Start Time 1400   Class Stop Time 1445   Class Time Calculation (min) 45 min    Row Name 09/07/23 1500     Education   Cardiac Education Topics Pritikin   Customer service manager   Weekly Topic Adding Flavor - Sodium-Free   Instruction Review Code 1- Verbalizes Understanding   Class Start Time 1358   Class Stop Time 1436   Class Time Calculation (min) 38 min    Row Name 09/09/23 1500     Education   Cardiac Education Topics Pritikin   Licensed conveyancer Nutrition   Nutrition Overview of the Pritikin Eating Plan   Instruction Review Code 1- Verbalizes Understanding   Class Start Time 1357   Class Stop Time 1444   Class Time Calculation (min) 47 min    Row Name 09/12/23 1400     Education   Cardiac Education Topics Pritikin   Nurse, children's   Educator Exercise Physiologist   Select Psychosocial   Psychosocial Healthy Minds, Bodies, Hearts   Instruction Review Code 1- Verbalizes Understanding   Class Start Time 1409   Class Stop Time 1443   Class Time Calculation (min) 34 min    Row Name 09/19/23 1400     Education  Cardiac Education Topics Pritikin   Select Core Videos     Core Videos   Educator Nurse   Select Nutrition   Nutrition Becoming a Pritikin Chef   Instruction Review Code 1- Verbalizes Understanding   Class Start Time 1401   Class Stop Time 1436   Class Time Calculation (min) 35 min    Row Name 09/23/23 1500     Education   Cardiac Education Topics Pritikin   Runner, broadcasting/film/video Nutrition   Nutrition Workshop Label Reading   Instruction Review Code 1- Verbalizes Understanding   Class Start Time 1400   Class Stop Time 1440   Class Time Calculation (min) 40 min    Row Name 09/26/23 1400     Education   Cardiac Education Topics Pritikin   Select Workshops     Workshops   Educator Exercise Physiologist   Select Psychosocial   Psychosocial Workshop Recognizing and Reducing Stress   Instruction Review Code 1- Verbalizes Understanding   Class Start Time 1358   Class Stop Time 1445   Class Time Calculation (min) 47 min            Core Videos: Exercise    Move It!  Clinical staff conducted group or individual video education with verbal and written material and guidebook.  Patient learns the recommended Pritikin exercise program. Exercise with the goal of living a long, healthy life. Some of the health benefits of exercise include controlled diabetes, healthier blood pressure levels, improved cholesterol levels, improved heart and lung capacity, improved sleep, and better body composition. Everyone should speak with their doctor before starting or changing an exercise routine.  Biomechanical Limitations Clinical staff conducted group or individual video education with verbal and written material and guidebook.  Patient learns how biomechanical limitations can impact exercise and how we can mitigate and possibly overcome limitations to have an impactful and balanced exercise routine.  Body Composition Clinical staff conducted group or individual video education with verbal and written material and guidebook.  Patient learns that body composition (ratio of muscle mass to fat mass) is a key component to assessing overall fitness, rather than body weight alone. Increased fat mass, especially visceral belly fat, can put [place] at increased risk for metabolic syndrome, type 2 diabetes, heart disease, and even  death. It is recommended to combine diet and exercise (cardiovascular and resistance training) to improve your body composition. Seek guidance from your physician and exercise physiologist before implementing an exercise routine.  Exercise Action Plan Clinical staff conducted group or individual video education with verbal and written material and guidebook.  Patient learns the recommended strategies to achieve and enjoy long-term exercise adherence, including variety, self-motivation, self-efficacy, and positive decision making. Benefits of exercise include fitness, good health, weight management, more energy, better sleep, less stress, and overall well-being.  Medical   Heart Disease Risk Reduction Clinical staff conducted group or individual video education with verbal and written material and guidebook.  Patient learns our heart is our most vital organ as it circulates oxygen, nutrients, white blood cells, and hormones throughout the entire body, and carries waste away. Data supports a plant-based eating plan like the Pritikin Program for its effectiveness in slowing progression of and reversing heart disease. The video provides a number of recommendations to address heart disease.   Metabolic Syndrome and Belly Fat  Clinical staff conducted group or individual video education with verbal and written material and  guidebook.  Patient learns what metabolic syndrome is, how it leads to heart disease, and how one can reverse it and keep it from coming back. You have metabolic syndrome if you have 3 of the following 5 criteria: abdominal obesity, high blood pressure, high triglycerides, low HDL cholesterol, and high blood sugar.  Hypertension and Heart Disease Clinical staff conducted group or individual video education with verbal and written material and guidebook.  Patient learns that high blood pressure, or hypertension, is very common in the [place]. Hypertension is largely due to  excessive salt intake, but other important risk factors include being overweight, physical inactivity, drinking too much alcohol, smoking, and not eating enough potassium from fruits and vegetables. High blood pressure is a leading risk factor for heart attack, stroke, congestive heart failure, dementia, kidney failure, and premature death. Long-term effects of excessive salt intake include stiffening of the arteries and thickening of heart muscle and organ damage. Recommendations include ways to reduce hypertension and the risk of heart disease.  Diseases of Our Time - Focusing on Diabetes Clinical staff conducted group or individual video education with verbal and written material and guidebook.  Patient learns why the best way to stop diseases of our time is prevention, through food and other lifestyle changes. Medicine (such as prescription pills and surgeries) is often only a Band-Aid on the problem, not a long-term solution. Most common diseases of our time include obesity, type 2 diabetes, hypertension, heart disease, and cancer. The Pritikin Program is recommended and has been proven to help reduce, reverse, and/or prevent the damaging effects of metabolic syndrome.  Nutrition   Overview of the Pritikin Eating Plan  Clinical staff conducted group or individual video education with verbal and written material and guidebook.  Patient learns about the Pritikin Eating Plan for disease risk reduction. The Pritikin Eating Plan emphasizes a wide variety of unrefined, minimally-processed carbohydrates, like fruits, vegetables, whole grains, and legumes. Go, Caution, and Stop food choices are explained. Plant-based and lean animal proteins are emphasized. Rationale provided for low sodium intake for blood pressure control, low added sugars for blood sugar stabilization, and low added fats and oils for coronary artery disease risk reduction and weight management.  Calorie Density  Clinical staff conducted  group or individual video education with verbal and written material and guidebook.  Patient learns about calorie density and how it impacts the Pritikin Eating Plan. Knowing the characteristics of the food you choose will help you decide whether those foods will lead to weight gain or weight loss, and whether you want to consume more or less of them. Weight loss is usually a side effect of the Pritikin Eating Plan because of its focus on low calorie-dense foods.  Label Reading  Clinical staff conducted group or individual video education with verbal and written material and guidebook.  Patient learns about the Pritikin recommended label reading guidelines and corresponding recommendations regarding calorie density, added sugars, sodium content, and whole grains.  Dining Out - Part 1  Clinical staff conducted group or individual video education with verbal and written material and guidebook.  Patient learns that restaurant meals can be sabotaging because they can be so high in calories, fat, sodium, and/or sugar. Patient learns recommended strategies on how to positively address this and avoid unhealthy pitfalls.  Facts on Fats  Clinical staff conducted group or individual video education with verbal and written material and guidebook.  Patient learns that lifestyle modifications can be just as effective, if not more  so, as many medications for lowering your risk of heart disease. A Pritikin lifestyle can help to reduce your risk of inflammation and atherosclerosis (cholesterol build-up, or plaque, in the artery walls). Lifestyle interventions such as dietary choices and physical activity address the cause of atherosclerosis. A review of the types of fats and their impact on blood cholesterol levels, along with dietary recommendations to reduce fat intake is also included.  Nutrition Action Plan  Clinical staff conducted group or individual video education with verbal and written material and  guidebook.  Patient learns how to incorporate Pritikin recommendations into their lifestyle. Recommendations include planning and keeping personal health goals in mind as an important part of their success.  Healthy Mind-Set    Healthy Minds, Bodies, Hearts  Clinical staff conducted group or individual video education with verbal and written material and guidebook.  Patient learns how to identify when they are stressed. Video will discuss the impact of that stress, as well as the many benefits of stress management. Patient will also be introduced to stress management techniques. The way we think, act, and feel has an impact on our hearts.  How Our Thoughts Can Heal Our Hearts  Clinical staff conducted group or individual video education with verbal and written material and guidebook.  Patient learns that negative thoughts can cause depression and anxiety. This can result in negative lifestyle behavior and serious health problems. Cognitive behavioral therapy is an effective method to help control our thoughts in order to change and improve our emotional outlook.  Additional Videos:  Exercise    Improving Performance  Clinical staff conducted group or individual video education with verbal and written material and guidebook.  Patient learns to use a non-linear approach by alternating intensity levels and lengths of time spent exercising to help burn more calories and lose more body fat. Cardiovascular exercise helps improve heart health, metabolism, hormonal balance, blood sugar control, and recovery from fatigue. Resistance training improves strength, endurance, balance, coordination, reaction time, metabolism, and muscle mass. Flexibility exercise improves circulation, posture, and balance. Seek guidance from your physician and exercise physiologist before implementing an exercise routine and learn your capabilities and proper form for all exercise.  Introduction to Yoga  Clinical staff  conducted group or individual video education with verbal and written material and guidebook.  Patient learns about yoga, a discipline of the coming together of mind, breath, and body. The benefits of yoga include improved flexibility, improved range of motion, better posture and core strength, increased lung function, weight loss, and positive self-image. Yoga's heart health benefits include lowered blood pressure, healthier heart rate, decreased cholesterol and triglyceride levels, improved immune function, and reduced stress. Seek guidance from your physician and exercise physiologist before implementing an exercise routine and learn your capabilities and proper form for all exercise.  Medical   Aging: Enhancing Your Quality of Life  Clinical staff conducted group or individual video education with verbal and written material and guidebook.  Patient learns key strategies and recommendations to stay in good physical health and enhance quality of life, such as prevention strategies, having an advocate, securing a Health Care Proxy and Power of Attorney, and keeping a list of medications and system for tracking them. It also discusses how to avoid risk for bone loss.  Biology of Weight Control  Clinical staff conducted group or individual video education with verbal and written material and guidebook.  Patient learns that weight gain occurs because we consume more calories than we burn (eating more,  moving less). Even if your body weight is normal, you may have higher ratios of fat compared to muscle mass. Too much body fat puts you at increased risk for cardiovascular disease, heart attack, stroke, type 2 diabetes, and obesity-related cancers. In addition to exercise, following the Pritikin Eating Plan can help reduce your risk.  Decoding Lab Results  Clinical staff conducted group or individual video education with verbal and written material and guidebook.  Patient learns that lab test reflects one  measurement whose values change over time and are influenced by many factors, including medication, stress, sleep, exercise, food, hydration, pre-existing medical conditions, and more. It is recommended to use the knowledge from this video to become more involved with your lab results and evaluate your numbers to speak with your doctor.   Diseases of Our Time - Overview  Clinical staff conducted group or individual video education with verbal and written material and guidebook.  Patient learns that according to the CDC, 50% to 70% of chronic diseases (such as obesity, type 2 diabetes, elevated lipids, hypertension, and heart disease) are avoidable through lifestyle improvements including healthier food choices, listening to satiety cues, and increased physical activity.  Sleep Disorders Clinical staff conducted group or individual video education with verbal and written material and guidebook.  Patient learns how good quality and duration of sleep are important to overall health and well-being. Patient also learns about sleep disorders and how they impact health along with recommendations to address them, including discussing with a physician.  Nutrition  Dining Out - Part 2 Clinical staff conducted group or individual video education with verbal and written material and guidebook.  Patient learns how to plan ahead and communicate in order to maximize their dining experience in a healthy and nutritious manner. Included are recommended food choices based on the type of restaurant the patient is visiting.   Fueling a Banker conducted group or individual video education with verbal and written material and guidebook.  There is a strong connection between our food choices and our health. Diseases like obesity and type 2 diabetes are very prevalent and are in large-part due to lifestyle choices. The Pritikin Eating Plan provides plenty of food and hunger-curbing satisfaction. It is  easy to follow, affordable, and helps reduce health risks.  Menu Workshop  Clinical staff conducted group or individual video education with verbal and written material and guidebook.  Patient learns that restaurant meals can sabotage health goals because they are often packed with calories, fat, sodium, and sugar. Recommendations include strategies to plan ahead and to communicate with the manager, chef, or server to help order a healthier meal.  Planning Your Eating Strategy  Clinical staff conducted group or individual video education with verbal and written material and guidebook.  Patient learns about the Pritikin Eating Plan and its benefit of reducing the risk of disease. The Pritikin Eating Plan does not focus on calories. Instead, it emphasizes high-quality, nutrient-rich foods. By knowing the characteristics of the foods, we choose, we can determine their calorie density and make informed decisions.  Targeting Your Nutrition Priorities  Clinical staff conducted group or individual video education with verbal and written material and guidebook.  Patient learns that lifestyle habits have a tremendous impact on disease risk and progression. This video provides eating and physical activity recommendations based on your personal health goals, such as reducing LDL cholesterol, losing weight, preventing or controlling type 2 diabetes, and reducing high blood pressure.  Vitamins and Minerals  Clinical staff conducted group or individual video education with verbal and written material and guidebook.  Patient learns different ways to obtain key vitamins and minerals, including through a recommended healthy diet. It is important to discuss all supplements you take with your doctor.   Healthy Mind-Set    Smoking Cessation  Clinical staff conducted group or individual video education with verbal and written material and guidebook.  Patient learns that cigarette smoking and tobacco addiction pose  a serious health risk which affects millions of people. Stopping smoking will significantly reduce the risk of heart disease, lung disease, and many forms of cancer. Recommended strategies for quitting are covered, including working with your doctor to develop a successful plan.  Culinary   Becoming a Set designer conducted group or individual video education with verbal and written material and guidebook.  Patient learns that cooking at home can be healthy, cost-effective, quick, and puts them in control. Keys to cooking healthy recipes will include looking at your recipe, assessing your equipment needs, planning ahead, making it simple, choosing cost-effective seasonal ingredients, and limiting the use of added fats, salts, and sugars.  Cooking - Breakfast and Snacks  Clinical staff conducted group or individual video education with verbal and written material and guidebook.  Patient learns how important breakfast is to satiety and nutrition through the entire day. Recommendations include key foods to eat during breakfast to help stabilize blood sugar levels and to prevent overeating at meals later in the day. Planning ahead is also a key component.  Cooking - Educational psychologist conducted group or individual video education with verbal and written material and guidebook.  Patient learns eating strategies to improve overall health, including an approach to cook more at home. Recommendations include thinking of animal protein as a side on your plate rather than center stage and focusing instead on lower calorie dense options like vegetables, fruits, whole grains, and plant-based proteins, such as beans. Making sauces in large quantities to freeze for later and leaving the skin on your vegetables are also recommended to maximize your experience.  Cooking - Healthy Salads and Dressing Clinical staff conducted group or individual video education with verbal and written  material and guidebook.  Patient learns that vegetables, fruits, whole grains, and legumes are the foundations of the Pritikin Eating Plan. Recommendations include how to incorporate each of these in flavorful and healthy salads, and how to create homemade salad dressings. Proper handling of ingredients is also covered. Cooking - Soups and [employer] - Soups and Desserts Clinical staff conducted group or individual video education with verbal and written material and guidebook.  Patient learns that Pritikin soups and desserts make for easy, nutritious, and delicious snacks and meal components that are low in sodium, fat, sugar, and calorie density, while high in vitamins, minerals, and filling fiber. Recommendations include simple and healthy ideas for soups and desserts.   Overview     The Pritikin Solution Program Overview Clinical staff conducted group or individual video education with verbal and written material and guidebook.  Patient learns that the results of the Pritikin Program have been documented in more than 100 articles published in peer-reviewed journals, and the benefits include reducing risk factors for (and, in some cases, even reversing) high cholesterol, high blood pressure, type 2 diabetes, obesity, and more! An overview of the three key pillars of the Pritikin Program will be covered: eating well, doing regular exercise, and having a healthy mind-set.  WORKSHOPS  Exercise: Exercise Basics: Building Your Action Plan Clinical staff led group instruction and group discussion with PowerPoint presentation and patient guidebook. To enhance the learning environment the use of posters, models and videos may be added. At the conclusion of this workshop, patients will comprehend the difference between physical activity and exercise, as well as the benefits of incorporating both, into their routine. Patients will understand the FITT (Frequency, Intensity, Time, and Type) principle  and how to use it to build an exercise action plan. In addition, safety concerns and other considerations for exercise and cardiac rehab will be addressed by the presenter. The purpose of this lesson is to promote a comprehensive and effective weekly exercise routine in order to improve patients' overall level of fitness.   Managing Heart Disease: Your Path to a Healthier Heart Clinical staff led group instruction and group discussion with PowerPoint presentation and patient guidebook. To enhance the learning environment the use of posters, models and videos may be added.At the conclusion of this workshop, patients will understand the anatomy and physiology of the heart. Additionally, they will understand how Pritikin's three pillars impact the risk factors, the progression, and the management of heart disease.  The purpose of this lesson is to provide a high-level overview of the heart, heart disease, and how the Pritikin lifestyle positively impacts risk factors.  Exercise Biomechanics Clinical staff led group instruction and group discussion with PowerPoint presentation and patient guidebook. To enhance the learning environment the use of posters, models and videos may be added. Patients will learn how the structural parts of their bodies function and how these functions impact their daily activities, movement, and exercise. Patients will learn how to promote a neutral spine, learn how to manage pain, and identify ways to improve their physical movement in order to promote healthy living. The purpose of this lesson is to expose patients to common physical limitations that impact physical activity. Participants will learn practical ways to adapt and manage aches and pains, and to minimize their effect on regular exercise. Patients will learn how to maintain good posture while sitting, walking, and lifting.  Balance Training and Fall Prevention  Clinical staff led group instruction and group  discussion with PowerPoint presentation and patient guidebook. To enhance the learning environment the use of posters, models and videos may be added. At the conclusion of this workshop, patients will understand the importance of their sensorimotor skills (vision, proprioception, and the vestibular system) in maintaining their ability to balance as they age. Patients will apply a variety of balancing exercises that are appropriate for their current level of function. Patients will understand the common causes for poor balance, possible solutions to these problems, and ways to modify their physical environment in order to minimize their fall risk. The purpose of this lesson is to teach patients about the importance of maintaining balance as they age and ways to minimize their risk of falling.  WORKSHOPS   Nutrition:  Fueling a Ship broker led group instruction and group discussion with PowerPoint presentation and patient guidebook. To enhance the learning environment the use of posters, models and videos may be added. Patients will review the foundational principles of the Pritikin Eating Plan and understand what constitutes a serving size in each of the food groups. Patients will also learn Pritikin-friendly foods that are better choices when away from home and review make-ahead meal and snack options. Calorie density will be reviewed and applied to three nutrition priorities: weight maintenance, weight loss,  and weight gain. The purpose of this lesson is to reinforce (in a group setting) the key concepts around what patients are recommended to eat and how to apply these guidelines when away from home by planning and selecting Pritikin-friendly options. Patients will understand how calorie density may be adjusted for different weight management goals.  Mindful Eating  Clinical staff led group instruction and group discussion with PowerPoint presentation and patient guidebook. To enhance  the learning environment the use of posters, models and videos may be added. Patients will briefly review the concepts of the Pritikin Eating Plan and the importance of low-calorie dense foods. The concept of mindful eating will be introduced as well as the importance of paying attention to internal hunger signals. Triggers for non-hunger eating and techniques for dealing with triggers will be explored. The purpose of this lesson is to provide patients with the opportunity to review the basic principles of the Pritikin Eating Plan, discuss the value of eating mindfully and how to measure internal cues of hunger and fullness using the Hunger Scale. Patients will also discuss reasons for non-hunger eating and learn strategies to use for controlling emotional eating.  Targeting Your Nutrition Priorities Clinical staff led group instruction and group discussion with PowerPoint presentation and patient guidebook. To enhance the learning environment the use of posters, models and videos may be added. Patients will learn how to determine their genetic susceptibility to disease by reviewing their family history. Patients will gain insight into the importance of diet as part of an overall healthy lifestyle in mitigating the impact of genetics and other environmental insults. The purpose of this lesson is to provide patients with the opportunity to assess their personal nutrition priorities by looking at their family history, their own health history and current risk factors. Patients will also be able to discuss ways of prioritizing and modifying the Pritikin Eating Plan for their highest risk areas  Menu  Clinical staff led group instruction and group discussion with PowerPoint presentation and patient guidebook. To enhance the learning environment the use of posters, models and videos may be added. Using menus brought in from [employer], or printed from [employer], patients will apply the Pritikin dining out  guidelines that were presented in the [employer] video. Patients will also be able to practice these guidelines in a variety of provided scenarios. The purpose of this lesson is to provide patients with the opportunity to practice hands-on learning of the Pritikin Dining Out guidelines with actual menus and practice scenarios.  Label Reading Clinical staff led group instruction and group discussion with PowerPoint presentation and patient guidebook. To enhance the learning environment the use of posters, models and videos may be added. Patients will review and discuss the Pritikin label reading guidelines presented in Pritikin's Label Reading Educational series video. Using fool labels brought in from local grocery stores and markets, patients will apply the label reading guidelines and determine if the packaged food meet the Pritikin guidelines. The purpose of this lesson is to provide patients with the opportunity to review, discuss, and practice hands-on learning of the Pritikin Label Reading guidelines with actual packaged food labels. Cooking School  Pritikin's [employer] are designed to teach patients ways to prepare quick, simple, and affordable recipes at home. The importance of nutrition's role in chronic disease risk reduction is reflected in its emphasis in the overall Pritikin program. By learning how to prepare essential core Pritikin Eating Plan recipes, patients will increase control over  what they eat; be able to customize the flavor of foods without the use of added salt, sugar, or fat; and improve the quality of the food they consume. By learning a set of core recipes which are easily assembled, quickly prepared, and affordable, patients are more likely to prepare more healthy foods at home. These workshops focus on convenient breakfasts, simple entres, side dishes, and desserts which can be prepared with minimal effort and are consistent with nutrition  recommendations for cardiovascular risk reduction. Cooking [employer] are taught by a Armed forces logistics/support/administrative officer (RD) who has been trained by the [employer]. The chef or RD has a clear understanding of the importance of minimizing - if not completely eliminating - added fat, sugar, and sodium in recipes. Throughout the series of Cooking School Workshop sessions, patients will learn about healthy ingredients and efficient methods of cooking to build confidence in their capability to prepare    Cooking School weekly topics:  Adding Flavor- Sodium-Free  Fast and Healthy Breakfasts  Powerhouse Plant-Based Proteins  Satisfying Salads and Dressings  Simple Sides and Sauces  International Cuisine-Spotlight on the [employer] Zones  Delicious Desserts  Savory Soups  [employer] - Meals in a Astronomer Appetizers and Snacks  Comforting Weekend Breakfasts  One-Pot Wonders   Fast Evening Meals  Landscape architect Your [employer] Plate  WORKSHOPS   Healthy Mindset (Psychosocial):  Focused Goals, Sustainable Changes Clinical staff led group instruction and group discussion with PowerPoint presentation and patient guidebook. To enhance the learning environment the use of posters, models and videos may be added. Patients will be able to apply effective goal setting strategies to establish at least one personal goal, and then take consistent, meaningful action toward that goal. They will learn to identify common barriers to achieving personal goals and develop strategies to overcome them. Patients will also gain an understanding of how our mind-set can impact our ability to achieve goals and the importance of cultivating a positive and growth-oriented mind-set. The purpose of this lesson is to provide patients with a deeper understanding of how to set and achieve personal goals, as well as the tools and strategies needed to overcome common obstacles which may arise along  the way.  From Head to Heart: The Power of a Healthy Outlook  Clinical staff led group instruction and group discussion with PowerPoint presentation and patient guidebook. To enhance the learning environment the use of posters, models and videos may be added. Patients will be able to recognize and describe the impact of emotions and mood on physical health. They will discover the importance of self-care and explore self-care practices which may work for them. Patients will also learn how to utilize the 4 C's to cultivate a healthier outlook and better manage stress and challenges. The purpose of this lesson is to demonstrate to patients how a healthy outlook is an essential part of maintaining good health, especially as they continue their cardiac rehab journey.  Healthy Sleep for a Healthy Heart Clinical staff led group instruction and group discussion with PowerPoint presentation and patient guidebook. To enhance the learning environment the use of posters, models and videos may be added. At the conclusion of this workshop, patients will be able to demonstrate knowledge of the importance of sleep to overall health, well-being, and quality of life. They will understand the symptoms of, and treatments for, common sleep disorders. Patients will also be able to identify daytime and nighttime behaviors which impact sleep,  and they will be able to apply these tools to help manage sleep-related challenges. The purpose of this lesson is to provide patients with a general overview of sleep and outline the importance of quality sleep. Patients will learn about a few of the most common sleep disorders. Patients will also be introduced to the concept of "sleep hygiene," and discover ways to self-manage certain sleeping problems through simple daily behavior changes. Finally, the workshop will motivate patients by clarifying the links between quality sleep and their goals of heart-healthy living.   Recognizing and  Reducing Stress Clinical staff led group instruction and group discussion with PowerPoint presentation and patient guidebook. To enhance the learning environment the use of posters, models and videos may be added. At the conclusion of this workshop, patients will be able to understand the types of stress reactions, differentiate between acute and chronic stress, and recognize the impact that chronic stress has on their health. They will also be able to apply different coping mechanisms, such as reframing negative self-talk. Patients will have the opportunity to practice a variety of stress management techniques, such as deep abdominal breathing, progressive muscle relaxation, and/or guided imagery.  The purpose of this lesson is to educate patients on the role of stress in their lives and to provide healthy techniques for coping with it.  Learning Barriers/Preferences:  Learning Barriers/Preferences - 08/23/23 1032       Learning Barriers/Preferences   Learning Barriers Hearing;Sight   wears glasses and bilateral hearing aids   Learning Preferences Audio;Group Instruction;Individual Instruction;Skilled Demonstration;Verbal Instruction             Education Topics:  Knowledge Questionnaire Score:  Knowledge Questionnaire Score - 08/23/23 1032       Knowledge Questionnaire Score   Pre Score 27/28             Core Components/Risk Factors/Patient Goals at Admission:  Personal Goals and Risk Factors at Admission - 08/23/23 1016       Core Components/Risk Factors/Patient Goals on Admission    Weight Management Yes;Weight Loss    Intervention Weight Management: Develop a combined nutrition and exercise program designed to reach desired caloric intake, while maintaining appropriate intake of nutrient and fiber, sodium and fats, and appropriate energy expenditure required for the weight goal.;Weight Management: Provide education and appropriate resources to help participant work on and  attain dietary goals.;Weight Management/Obesity: Establish reasonable short term and long term weight goals.    Admit Weight 211 lb 3.2 oz (95.8 kg)    Goal Weight: Short Term 196 lb (88.9 kg)    Expected Outcomes Short Term: Continue to assess and modify interventions until short term weight is achieved;Long Term: Adherence to nutrition and physical activity/exercise program aimed toward attainment of established weight goal;Weight Loss: Understanding of general recommendations for a balanced deficit meal plan, which promotes 1-2 lb weight loss per week and includes a negative energy balance of [phone] kcal/d;Understanding recommendations for meals to include 15-35% energy as protein, 25-35% energy from fat, 35-60% energy from carbohydrates, less than 200mg  of dietary cholesterol, 20-35 gm of total fiber daily;Understanding of distribution of calorie intake throughout the day with the consumption of 4-5 meals/snacks    Tobacco Cessation Yes    Number of packs per day 0    Intervention Assist the participant in steps to quit. Provide individualized education and counseling about committing to Tobacco Cessation, relapse prevention, and pharmacological support that can be provided by physician.;Education officer, environmental, assist with locating and accessing local/national  Quit Smoking programs, and support quit date choice.   Pt quit smoking 06/27/23   Expected Outcomes Long Term: Complete abstinence from all tobacco products for at least 12 months from quit date.    Hypertension Yes    Intervention Provide education on lifestyle modifcations including regular physical activity/exercise, weight management, moderate sodium restriction and increased consumption of fresh fruit, vegetables, and low fat dairy, alcohol moderation, and smoking cessation.;Monitor prescription use compliance.    Expected Outcomes Short Term: Continued assessment and intervention until BP is < 140/46mm HG in hypertensive  participants. < 130/71mm HG in hypertensive participants with diabetes, heart failure or chronic kidney disease.;Long Term: Maintenance of blood pressure at goal levels.    Lipids Yes    Intervention Provide education and support for participant on nutrition & aerobic/resistive exercise along with prescribed medications to achieve LDL 70mg , HDL >40mg .    Expected Outcomes Short Term: Participant states understanding of desired cholesterol values and is compliant with medications prescribed. Participant is following exercise prescription and nutrition guidelines.;Long Term: Cholesterol controlled with medications as prescribed, with individualized exercise RX and with personalized nutrition plan. Value goals: LDL < 70mg , HDL > 40 mg.             Core Components/Risk Factors/Patient Goals Review:   Goals and Risk Factor Review     Row Name 09/27/23 1342             Core Components/Risk Factors/Patient Goals Review   Personal Goals Review Weight Management/Obesity;Hypertension;Lipids;Tobacco Cessation       Review Tommy Page is doing well with exercise. Vital signs have been stable. Tommy Page has lost 2.3 kg since starting cardiac rehab.       Expected Outcomes Tommy Page will continue to participate in cardiac rehab for exercise, nutrition and lifestyle modifications.                Core Components/Risk Factors/Patient Goals at Discharge (Final Review):   Goals and Risk Factor Review - 09/27/23 1342       Core Components/Risk Factors/Patient Goals Review   Personal Goals Review Weight Management/Obesity;Hypertension;Lipids;Tobacco Cessation    Review Tommy Page is doing well with exercise. Vital signs have been stable. Tommy Page has lost 2.3 kg since starting cardiac rehab.    Expected Outcomes Tommy Page will continue to participate in cardiac rehab for exercise, nutrition and lifestyle modifications.             ITP Comments:  ITP Comments     Row Name 08/23/23 1008 08/29/23 1715 09/27/23 1337        ITP Comments Armanda Magic, MD: Medical Director.  Introduction to the [facility] Program/Intensive Cardiac Rehab.  Initial orientation packet reviewed witht he patient. 30 Day ITP Review. Vinny started cardiac rehab on 08/29/23. Vinney did well with exercise. 30 Day ITP Review. Tommy Page has good attendance and participation with exercise at cardiac rehab              Comments: See ITP comments.Thayer Headings RN BSN

## 2023-09-28 ENCOUNTER — Encounter (HOSPITAL_COMMUNITY)
Admission: RE | Admit: 2023-09-28 | Discharge: 2023-09-28 | Disposition: A | Discharge status: Home / Self Care | Payer: BC Managed Care – PPO | Source: Ambulatory Visit | Attending: Cardiology

## 2023-09-28 ENCOUNTER — Ambulatory Visit (HOSPITAL_COMMUNITY): Payer: BC Managed Care – PPO

## 2023-09-28 DIAGNOSIS — Z955 Presence of coronary angioplasty implant and graft: Secondary | ICD-10-CM | POA: Diagnosis not present

## 2023-09-28 DIAGNOSIS — Z48812 Encounter for surgical aftercare following surgery on the circulatory system: Secondary | ICD-10-CM | POA: Diagnosis not present

## 2023-09-28 DIAGNOSIS — I252 Old myocardial infarction: Secondary | ICD-10-CM | POA: Diagnosis not present

## 2023-09-28 DIAGNOSIS — I2111 ST elevation (STEMI) myocardial infarction involving right coronary artery: Secondary | ICD-10-CM

## 2023-09-29 NOTE — Progress Notes (Signed)
 QUALITY OF LIFE SCORE REVIEW  Pt completed Quality of Life survey as a participant in Cardiac Rehab.  Scores 19.0 or below are considered low.  Pt score very low in several areas Overall 17.36, Health and Function 13.80, socioeconomic 23.0, physiological and spiritual 14.79, family 28.0. Patient quality of life slightly altered by physical constraints which limits ability to perform as prior to recent cardiac illness. Tommy Page denies being depressed currently. Tommy Page says the antidepressant he is taking is controlling his depression.Tommy Page has good support from his brother and a good friend who lives in the area. Tommy Page says he is dissatisfied with his health due to his recent MI and stent placement. Tommy Page says he has not smoked a cigarette in a while. Tommy Page says he is chewing a lot of nicotine gum. Tommy Page says that he is not as short of breath. Tommy Page uses his trelegy inhaler as needed. Tommy Page also reports that his energy level has increased since he has been participating in cardiac rehab.  Offered emotional support and reassurance.  Will continue to monitor and intervene as necessary.  Tommy Page says he does not want his quality of life questionnaire forwarded to his primary care provider. Thayer Headings RN BSN

## 2023-09-30 ENCOUNTER — Encounter (HOSPITAL_COMMUNITY)
Admission: RE | Admit: 2023-09-30 | Discharge: 2023-09-30 | Disposition: A | Discharge status: Home / Self Care | Payer: BC Managed Care – PPO | Source: Ambulatory Visit | Attending: Cardiology | Admitting: Cardiology

## 2023-09-30 ENCOUNTER — Ambulatory Visit (HOSPITAL_COMMUNITY): Payer: BC Managed Care – PPO

## 2023-09-30 DIAGNOSIS — Z48812 Encounter for surgical aftercare following surgery on the circulatory system: Secondary | ICD-10-CM | POA: Diagnosis not present

## 2023-09-30 DIAGNOSIS — I2111 ST elevation (STEMI) myocardial infarction involving right coronary artery: Secondary | ICD-10-CM

## 2023-09-30 DIAGNOSIS — I252 Old myocardial infarction: Secondary | ICD-10-CM | POA: Diagnosis not present

## 2023-09-30 DIAGNOSIS — Z955 Presence of coronary angioplasty implant and graft: Secondary | ICD-10-CM | POA: Diagnosis not present

## 2023-10-03 ENCOUNTER — Encounter (HOSPITAL_COMMUNITY)
Admission: RE | Admit: 2023-10-03 | Discharge: 2023-10-03 | Disposition: A | Discharge status: Home / Self Care | Payer: BC Managed Care – PPO | Source: Ambulatory Visit | Attending: Cardiology

## 2023-10-03 ENCOUNTER — Ambulatory Visit (HOSPITAL_COMMUNITY): Payer: BC Managed Care – PPO

## 2023-10-03 DIAGNOSIS — Z48812 Encounter for surgical aftercare following surgery on the circulatory system: Secondary | ICD-10-CM | POA: Diagnosis not present

## 2023-10-03 DIAGNOSIS — Z955 Presence of coronary angioplasty implant and graft: Secondary | ICD-10-CM

## 2023-10-03 DIAGNOSIS — I2111 ST elevation (STEMI) myocardial infarction involving right coronary artery: Secondary | ICD-10-CM

## 2023-10-03 DIAGNOSIS — I252 Old myocardial infarction: Secondary | ICD-10-CM | POA: Diagnosis not present

## 2023-10-05 ENCOUNTER — Ambulatory Visit (HOSPITAL_COMMUNITY): Payer: BC Managed Care – PPO

## 2023-10-05 ENCOUNTER — Encounter (HOSPITAL_COMMUNITY)
Admission: RE | Admit: 2023-10-05 | Discharge: 2023-10-05 | Disposition: A | Discharge status: Home / Self Care | Payer: BC Managed Care – PPO | Source: Ambulatory Visit | Attending: Cardiology

## 2023-10-05 DIAGNOSIS — I252 Old myocardial infarction: Secondary | ICD-10-CM | POA: Diagnosis not present

## 2023-10-05 DIAGNOSIS — Z955 Presence of coronary angioplasty implant and graft: Secondary | ICD-10-CM | POA: Diagnosis not present

## 2023-10-05 DIAGNOSIS — I2111 ST elevation (STEMI) myocardial infarction involving right coronary artery: Secondary | ICD-10-CM

## 2023-10-05 DIAGNOSIS — Z48812 Encounter for surgical aftercare following surgery on the circulatory system: Secondary | ICD-10-CM | POA: Diagnosis not present

## 2023-10-07 ENCOUNTER — Encounter (HOSPITAL_COMMUNITY)
Admission: RE | Admit: 2023-10-07 | Discharge: 2023-10-07 | Disposition: A | Discharge status: Home / Self Care | Payer: BC Managed Care – PPO | Source: Ambulatory Visit | Attending: Cardiology | Admitting: Cardiology

## 2023-10-07 ENCOUNTER — Ambulatory Visit (HOSPITAL_COMMUNITY): Payer: BC Managed Care – PPO

## 2023-10-07 DIAGNOSIS — I252 Old myocardial infarction: Secondary | ICD-10-CM | POA: Diagnosis not present

## 2023-10-07 DIAGNOSIS — Z955 Presence of coronary angioplasty implant and graft: Secondary | ICD-10-CM | POA: Diagnosis not present

## 2023-10-07 DIAGNOSIS — Z48812 Encounter for surgical aftercare following surgery on the circulatory system: Secondary | ICD-10-CM | POA: Diagnosis not present

## 2023-10-07 DIAGNOSIS — I2111 ST elevation (STEMI) myocardial infarction involving right coronary artery: Secondary | ICD-10-CM

## 2023-10-10 ENCOUNTER — Ambulatory Visit (HOSPITAL_COMMUNITY): Payer: BC Managed Care – PPO

## 2023-10-10 ENCOUNTER — Encounter (HOSPITAL_COMMUNITY)
Admission: RE | Admit: 2023-10-10 | Discharge: 2023-10-10 | Disposition: A | Discharge status: Home / Self Care | Payer: BC Managed Care – PPO | Source: Ambulatory Visit | Attending: Cardiology

## 2023-10-10 DIAGNOSIS — Z955 Presence of coronary angioplasty implant and graft: Secondary | ICD-10-CM

## 2023-10-10 DIAGNOSIS — I252 Old myocardial infarction: Secondary | ICD-10-CM | POA: Diagnosis not present

## 2023-10-10 DIAGNOSIS — I2111 ST elevation (STEMI) myocardial infarction involving right coronary artery: Secondary | ICD-10-CM

## 2023-10-10 DIAGNOSIS — Z48812 Encounter for surgical aftercare following surgery on the circulatory system: Secondary | ICD-10-CM | POA: Diagnosis not present

## 2023-10-12 ENCOUNTER — Ambulatory Visit (HOSPITAL_COMMUNITY): Payer: BC Managed Care – PPO

## 2023-10-12 ENCOUNTER — Encounter (HOSPITAL_COMMUNITY)
Admission: RE | Admit: 2023-10-12 | Discharge: 2023-10-12 | Disposition: A | Discharge status: Home / Self Care | Payer: BC Managed Care – PPO | Source: Ambulatory Visit | Attending: Cardiology

## 2023-10-12 DIAGNOSIS — Z955 Presence of coronary angioplasty implant and graft: Secondary | ICD-10-CM

## 2023-10-12 DIAGNOSIS — Z48812 Encounter for surgical aftercare following surgery on the circulatory system: Secondary | ICD-10-CM | POA: Diagnosis not present

## 2023-10-12 DIAGNOSIS — I252 Old myocardial infarction: Secondary | ICD-10-CM | POA: Diagnosis not present

## 2023-10-12 DIAGNOSIS — I2111 ST elevation (STEMI) myocardial infarction involving right coronary artery: Secondary | ICD-10-CM

## 2023-10-14 ENCOUNTER — Ambulatory Visit (HOSPITAL_COMMUNITY): Payer: BC Managed Care – PPO

## 2023-10-14 ENCOUNTER — Encounter (HOSPITAL_COMMUNITY)
Admission: RE | Admit: 2023-10-14 | Discharge: 2023-10-14 | Disposition: A | Discharge status: Home / Self Care | Payer: BC Managed Care – PPO | Source: Ambulatory Visit | Attending: Cardiology | Admitting: Cardiology

## 2023-10-14 DIAGNOSIS — I252 Old myocardial infarction: Secondary | ICD-10-CM | POA: Diagnosis not present

## 2023-10-14 DIAGNOSIS — Z955 Presence of coronary angioplasty implant and graft: Secondary | ICD-10-CM

## 2023-10-14 DIAGNOSIS — I2111 ST elevation (STEMI) myocardial infarction involving right coronary artery: Secondary | ICD-10-CM

## 2023-10-14 DIAGNOSIS — Z48812 Encounter for surgical aftercare following surgery on the circulatory system: Secondary | ICD-10-CM | POA: Diagnosis not present

## 2023-10-17 ENCOUNTER — Encounter (HOSPITAL_COMMUNITY)
Admission: RE | Admit: 2023-10-17 | Discharge: 2023-10-17 | Disposition: A | Discharge status: Home / Self Care | Payer: BC Managed Care – PPO | Source: Ambulatory Visit | Attending: Cardiology | Admitting: Cardiology

## 2023-10-17 ENCOUNTER — Ambulatory Visit (HOSPITAL_COMMUNITY): Payer: BC Managed Care – PPO

## 2023-10-17 DIAGNOSIS — Z955 Presence of coronary angioplasty implant and graft: Secondary | ICD-10-CM | POA: Diagnosis not present

## 2023-10-17 DIAGNOSIS — Z48812 Encounter for surgical aftercare following surgery on the circulatory system: Secondary | ICD-10-CM | POA: Diagnosis not present

## 2023-10-17 DIAGNOSIS — I2111 ST elevation (STEMI) myocardial infarction involving right coronary artery: Secondary | ICD-10-CM

## 2023-10-17 DIAGNOSIS — I252 Old myocardial infarction: Secondary | ICD-10-CM | POA: Diagnosis not present

## 2023-10-19 ENCOUNTER — Encounter (HOSPITAL_COMMUNITY)
Admission: RE | Admit: 2023-10-19 | Discharge: 2023-10-19 | Disposition: A | Discharge status: Home / Self Care | Payer: BC Managed Care – PPO | Source: Ambulatory Visit | Attending: Cardiology

## 2023-10-19 ENCOUNTER — Ambulatory Visit (HOSPITAL_COMMUNITY): Payer: BC Managed Care – PPO

## 2023-10-19 DIAGNOSIS — I2111 ST elevation (STEMI) myocardial infarction involving right coronary artery: Secondary | ICD-10-CM

## 2023-10-19 DIAGNOSIS — Z955 Presence of coronary angioplasty implant and graft: Secondary | ICD-10-CM | POA: Diagnosis not present

## 2023-10-19 DIAGNOSIS — Z48812 Encounter for surgical aftercare following surgery on the circulatory system: Secondary | ICD-10-CM | POA: Diagnosis not present

## 2023-10-19 DIAGNOSIS — I252 Old myocardial infarction: Secondary | ICD-10-CM | POA: Diagnosis not present

## 2023-10-21 ENCOUNTER — Ambulatory Visit (HOSPITAL_COMMUNITY): Payer: BC Managed Care – PPO

## 2023-10-21 ENCOUNTER — Encounter (HOSPITAL_COMMUNITY): Admission: RE | Admit: 2023-10-21 | Payer: BC Managed Care – PPO | Source: Ambulatory Visit

## 2023-10-21 ENCOUNTER — Telehealth (HOSPITAL_COMMUNITY): Payer: Self-pay

## 2023-10-21 NOTE — Telephone Encounter (Signed)
 Patient left message, called out due to 'GI issue', wants to return Monday.

## 2023-10-21 NOTE — Progress Notes (Signed)
 Cardiac Individual Treatment Plan  Patient Details  Name: Tommy Page MRN: [id number] Date of Birth: 1961-06-11 Referring Provider:   Flowsheet Row INTENSIVE CARDIAC REHAB ORIENT from 08/23/2023 in [facility]  Referring Provider Dr. Consepcion Hearing, MD covering)       Initial Encounter Date:  Flowsheet Row INTENSIVE CARDIAC REHAB ORIENT from 08/23/2023 in [facility]  Date 08/23/23       Visit Diagnosis: 06/27/23 Status post coronary artery stent placement  06/27/23 ST elevation myocardial infarction involving right coronary artery (HCC)  Patient's Home Medications on Admission:  Current Outpatient Medications:    ASPIRIN LOW DOSE 81 MG tablet, Take 81 mg by mouth daily., Disp: , Rfl:    BRILINTA 90 MG TABS tablet, Take 90 mg by mouth 2 (two) times daily., Disp: , Rfl:    escitalopram (LEXAPRO) 20 MG tablet, Take 20 mg by mouth daily., Disp: , Rfl:    Fluticasone-Umeclidin-Vilant (TRELEGY ELLIPTA) 100-62.5-25 MCG/ACT AEPB, Inhale 100 mcg into the lungs once as needed., Disp: , Rfl:    hydrocortisone (ANUSOL-HC) 2.5 % rectal cream, Place 1 application rectally 2 (two) times daily. Apply around anus for irritated & painful hemorrhoids, Disp: 15 g, Rfl: 2   metoprolol succinate (TOPROL-XL) 25 MG 24 hr tablet, Take 12.5 mg by mouth daily., Disp: , Rfl:    nicotine (NICODERM CQ - DOSED IN MG/24 HOURS) 21 mg/24hr patch, Place 21 mg onto the skin daily., Disp: , Rfl:    nicotine polacrilex (NICORETTE) 2 MG gum, Take 2 mg by mouth as needed for smoking cessation., Disp: , Rfl:    omeprazole (PRILOSEC OTC) 20 MG tablet, Take 20 mg by mouth daily., Disp: , Rfl:    PROBIOTIC PRODUCT PO, Take by mouth daily., Disp: , Rfl:    rosuvastatin (CRESTOR) 20 MG tablet, Take 20 mg by mouth daily., Disp: , Rfl:    simvastatin (ZOCOR) 20 MG tablet, Take 20 mg by mouth at bedtime.  , Disp: , Rfl:    traMADol (ULTRAM) 50 MG tablet, Take 1-2 tablets (50-100 mg total) by mouth every 6 (six) hours as needed for moderate pain or severe pain., Disp: 40 tablet, Rfl: 0   Wheat Dextrin (BENEFIBER DRINK MIX) PACK, Take 3 g by mouth daily., Disp: , Rfl:   Past Medical History: Past Medical History:  Diagnosis Date   Anxiety    Depression    GERD (gastroesophageal reflux disease)    Hemorrhoids    HOH (hard of hearing)    BOTH EARS    Tobacco Use: Social History   Tobacco Use  Smoking Status Every Day   Current packs/day: 0.00   Average packs/day: 1 pack/day for 36.0 years (36.0 ttl pk-yrs)   Types: Cigarettes   Start date: 06/26/1979   Last attempt to quit: 06/26/2015   Years since quitting: 8.3  Smokeless Tobacco Never    Labs: Review Flowsheet       Latest Ref Rng & Units 12/16/2015  Labs for ITP Cardiac and Pulmonary Rehab  TCO2 0 - 100 mmol/L 21     Capillary Blood Glucose: Lab Results  Component Value Date   GLUCAP 117 (H) 12/10/2015     Exercise Target Goals: Exercise Program Goal: Individual exercise prescription set using results from initial 6 min walk test and THRR while considering  patient's activity barriers and safety.   Exercise Prescription Goal: Initial exercise prescription builds  to 30-45 minutes a day of aerobic activity, 2-3 days per week.  Home exercise guidelines will be given to patient during program as part of exercise prescription that the participant will acknowledge.  Activity Barriers & Risk Stratification:  Activity Barriers & Cardiac Risk Stratification - 08/23/23 1037       Activity Barriers & Cardiac Risk Stratification   Activity Barriers Back Problems;Neck/Spine Problems    Cardiac Risk Stratification High             6 Minute Walk:  6 Minute Walk     Row Name 08/23/23 0929         6 Minute Walk   Phase Initial     Distance 1755 feet     Walk Time 6 minutes     # of Rest Breaks 0     MPH 3.32      METS 4.2     RPE 8     Perceived Dyspnea  0     VO2 Peak 14.7     Symptoms No     Resting HR 77 bpm     Resting BP 112/70     Resting Oxygen Saturation  96 %     Exercise Oxygen Saturation  during 6 min walk 97 %     Max Ex. HR 95 bpm     Max Ex. BP 138/72     2 Minute Post BP 112/68              Oxygen Initial Assessment:   Oxygen Re-Evaluation:   Oxygen Discharge (Final Oxygen Re-Evaluation):   Initial Exercise Prescription:  Initial Exercise Prescription - 08/23/23 1000       Date of Initial Exercise RX and Referring Provider   Date 08/23/23    Referring Provider Dr. Consepcion Hearing, MD covering)    Expected Discharge Date 11/16/23      Recumbant Bike   Level 2    RPM 60    Watts 25    Minutes 15    METs 4.2      Recumbant Elliptical   Level 2    RPM 60    Watts 50    Minutes 15    METs 4.2      Prescription Details   Frequency (times per week) 3    Duration Progress to 30 minutes of continuous aerobic without signs/symptoms of physical distress      Intensity   THRR 40-80% of Max Heartrate 64-127    Ratings of Perceived Exertion 11-13    Perceived Dyspnea 0-4      Progression   Progression Continue progressive overload as per policy without signs/symptoms or physical distress.      Resistance Training   Training Prescription Yes    Weight 4 lbs    Reps 10-15             Perform Capillary Blood Glucose checks as needed.  Exercise Prescription Changes:   Exercise Prescription Changes     Row Name 08/29/23 1624 09/12/23 1625 09/30/23 1606 10/14/23 1625       Response to Exercise   Blood Pressure (Admit) 118/64 118/60 114/68 130/76    Blood Pressure (Exercise) 148/90 156/80 -- --    Blood Pressure (Exit) 100/68 104/68 100/60 98/64    Heart Rate (Admit) 74 bpm 70 bpm 88 bpm 70 bpm    Heart Rate (Exercise) 100 bpm 98 bpm 119 bpm 121 bpm    Heart Rate (Exit) 69 bpm 75  bpm 94 bpm 79 bpm    Rating of Perceived Exertion  (Exercise) 11.5 10.5 12.5 11    Perceived Dyspnea (Exercise) 0 0 0 0    Symptoms 0 0 0 0    Comments Pt first day in the [facility] Reviewed MET's, goals and home Reviewed MET's Reviewed MET's and goals    Duration Progress to 30 minutes of  aerobic without signs/symptoms of physical distress Progress to 30 minutes of  aerobic without signs/symptoms of physical distress Progress to 30 minutes of  aerobic without signs/symptoms of physical distress Progress to 30 minutes of  aerobic without signs/symptoms of physical distress    Intensity THRR unchanged THRR unchanged THRR unchanged THRR unchanged      Progression   Progression Continue to progress workloads to maintain intensity without signs/symptoms of physical distress. Continue to progress workloads to maintain intensity without signs/symptoms of physical distress. Continue to progress workloads to maintain intensity without signs/symptoms of physical distress. Continue to progress workloads to maintain intensity without signs/symptoms of physical distress.    Average METs 3.3 4.35 5.05 4.85      Resistance Training   Training Prescription Yes Yes Yes Yes    Weight 4 lbs 4 lbs 6 lbs wts 6 lbs wts    Reps 10-15 10-15 10-15 10-15    Time 10 Minutes 10 Minutes 10 Minutes 10 Minutes      Recumbant Bike   Level 2 3 4 4     RPM 66 82 80 83    Watts 102 55 92 76    Minutes 15 15 15 15     METs 4 3.8 4.9 4.7      Recumbant Elliptical   Level 2 3 4 4     RPM 80 80 82 82    Watts 28 122 131 116    Minutes 15 15 15 15     METs 2.6 4.9 5.2 5      Home Exercise Plan   Plans to continue exercise at -- Home (comment) Home (comment) Home (comment)    Frequency -- Add 2 additional days to program exercise sessions. Add 2 additional days to program exercise sessions. Add 2 additional days to program exercise sessions.    Initial Home Exercises Provided -- 09/12/23 09/12/23 09/12/23             Exercise Comments:   Exercise Comments      Row Name 08/29/23 1634 09/12/23 1629 09/30/23 1608 10/14/23 1632     Exercise Comments Pt first day in the [facility] program. Pt tolerated exercise well with an average MET level of 3.3. He is off to a good start and is learning his THRR, RPE and ExRx Reviewed MET's, goals and home ExRx. Pt tolerated exercise well with an average MET level of 4.35. He is already progressing MET's and WL's and feels good with his exercie routine. He feels good about his goals and has been working on weight loss since the summer and is doing well, he states he's having a hard time with heart healthy eating, but is making changes. Reviewed MET's today. Pt tolerated exercise well with an average MET level of 5.05. Pt is increasing WL's and progressing MET's. He is doing well and feels good about his progress Reviewed MET's and goals. Pt tolerated exercise well with an average MET level of 4.85. He is doing well, some issues with quad muscle tighness and occasional pain, but still feels good with exercise routine. He's been working on  more heart healthy choices and is working with the RD to make some changes. He also states he had better control of his SOB             Exercise Goals and Review:   Exercise Goals     Row Name 08/23/23 1038             Exercise Goals   Increase Physical Activity Yes       Intervention Provide advice, education, support and counseling about physical activity/exercise needs.;Develop an individualized exercise prescription for aerobic and resistive training based on initial evaluation findings, risk stratification, comorbidities and participant's personal goals.       Expected Outcomes Short Term: Attend rehab on a regular basis to increase amount of physical activity.;Long Term: Exercising regularly at least 3-5 days a week.;Long Term: Add in home exercise to make exercise part of routine and to increase amount of physical activity.       Increase Strength and Stamina Yes        Intervention Provide advice, education, support and counseling about physical activity/exercise needs.;Develop an individualized exercise prescription for aerobic and resistive training based on initial evaluation findings, risk stratification, comorbidities and participant's personal goals.       Expected Outcomes Short Term: Increase workloads from initial exercise prescription for resistance, speed, and METs.;Short Term: Perform resistance training exercises routinely during rehab and add in resistance training at home;Long Term: Improve cardiorespiratory fitness, muscular endurance and strength as measured by increased METs and functional capacity ( )       Able to understand and use rate of perceived exertion (RPE) scale Yes       Intervention Provide education and explanation on how to use RPE scale       Expected Outcomes Short Term: Able to use RPE daily in rehab to express subjective intensity level;Long Term:  Able to use RPE to guide intensity level when exercising independently       Knowledge and understanding of Target Heart Rate Range (THRR) Yes       Intervention Provide education and explanation of THRR including how the numbers were predicted and where they are located for reference       Expected Outcomes Short Term: Able to state/look up THRR;Long Term: Able to use THRR to govern intensity when exercising independently;Short Term: Able to use daily as guideline for intensity in rehab       Understanding of Exercise Prescription Yes       Intervention Provide education, explanation, and written materials on patient's individual exercise prescription       Expected Outcomes Short Term: Able to explain program exercise prescription;Long Term: Able to explain home exercise prescription to exercise independently                Exercise Goals Re-Evaluation :  Exercise Goals Re-Evaluation     Row Name 08/29/23 1630 09/12/23 1627 10/14/23 1628         Exercise Goal  Re-Evaluation   Exercise Goals Review Increase Physical Activity;Understanding of Exercise Prescription;Increase Strength and Stamina;Knowledge and understanding of Target Heart Rate Range (THRR);Able to understand and use rate of perceived exertion (RPE) scale Increase Physical Activity;Understanding of Exercise Prescription;Increase Strength and Stamina;Knowledge and understanding of Target Heart Rate Range (THRR);Able to understand and use rate of perceived exertion (RPE) scale Increase Physical Activity;Understanding of Exercise Prescription;Increase Strength and Stamina;Knowledge and understanding of Target Heart Rate Range (THRR);Able to understand and use rate of perceived exertion (RPE) scale  Comments Pt first day in the [facility] program. Pt tolerated exercise well with an average MET level of 3.3. He is off to a good start and is learning his THRR, RPE and ExRx Reviewed MET's, goals and home ExRx. Pt tolerated exercise well with an average MET level of 4.35. He is already progressing MET's and WL's and feels good with his exercie routine. He feels good about his goals and has been working on weight loss since the summer and is doing well, he states he's having a hard time with heart healthy eating, but is making changes. Reviewed MET's and goals. Pt tolerated exercise well with an average MET level of 4.85. He is doing well, some issues with quad muscle tighness and occasional pain, but still feels good with exercise routine. He's been working on more heart healthy choices and is working with the RD to make some changes. He also states he had better control of his SOB     Expected Outcomes Will continue to monitor pt and progress workloads as tolerated without sign or symptom Will continue to monitor pt and progress workloads as tolerated without sign or symptom Will continue to monitor pt and progress workloads as tolerated without sign or symptom              Discharge Exercise  Prescription (Final Exercise Prescription Changes):  Exercise Prescription Changes - 10/14/23 1625       Response to Exercise   Blood Pressure (Admit) 130/76    Blood Pressure (Exit) 98/64    Heart Rate (Admit) 70 bpm    Heart Rate (Exercise) 121 bpm    Heart Rate (Exit) 79 bpm    Rating of Perceived Exertion (Exercise) 11    Perceived Dyspnea (Exercise) 0    Symptoms 0    Comments Reviewed MET's and goals    Duration Progress to 30 minutes of  aerobic without signs/symptoms of physical distress    Intensity THRR unchanged      Progression   Progression Continue to progress workloads to maintain intensity without signs/symptoms of physical distress.    Average METs 4.85      Resistance Training   Training Prescription Yes    Weight 6 lbs wts    Reps 10-15    Time 10 Minutes      Recumbant Bike   Level 4    RPM 83    Watts 76    Minutes 15    METs 4.7      Recumbant Elliptical   Level 4    RPM 82    Watts 116    Minutes 15    METs 5      Home Exercise Plan   Plans to continue exercise at Home (comment)    Frequency Add 2 additional days to program exercise sessions.    Initial Home Exercises Provided 09/12/23             Nutrition:  Target Goals: Understanding of nutrition guidelines, daily intake of sodium 1500mg , cholesterol 200mg , calories 30% from fat and 7% or less from saturated fats, daily to have 5 or more servings of fruits and vegetables.  Biometrics:  Pre Biometrics - 08/23/23 0822       Pre Biometrics   Waist Circumference 42.5 inches    Hip Circumference 42.5 inches    Waist to Hip Ratio 1 %    Triceps Skinfold 13 mm    % Body Fat 27.9 %    Grip Strength  44 kg    Flexibility 12 in    Single Leg Stand 30 seconds              Nutrition Therapy Plan and Nutrition Goals:  Nutrition Therapy & Goals - 09/27/23 1419       Nutrition Therapy   Diet Heart Healthy Diet    Drug/Food Interactions Statins/Certain Fruits      Personal  Nutrition Goals   Nutrition Goal Patient to identify strategies for reducing cardiovascular risk by attending the [facility] education and nutrition series weekly.   goal in progress.   Personal Goal #2 Patient to improve diet quality by using the plate method as a guide for meal planning to include lean protein/plant protein, fruits, vegetables, whole grains, nonfat dairy as part of a well-balanced diet.   goal in progress.   Personal Goal #3 Patient to identify strategies for weight loss of 0.5-2.0# per week.   goal in progress.   Comments Goals in progress. Tommy Page has medical history of STEMI. He continues to attend the [facility] education and nutrition series. Lipids/ LDL are not at goal; he continues crestor and diet modification. He is down 5.1# since starting with our program. He has been working toward smoking cessation. Patient will benefit from participation in intensive cardiac rehab for nutrition, exercise, and lifestyle modification.      Intervention Plan   Intervention Prescribe, educate and counsel regarding individualized specific dietary modifications aiming towards targeted core components such as weight, hypertension, lipid management, diabetes, heart failure and other comorbidities.;Nutrition handout(s) given to patient.    Expected Outcomes Short Term Goal: Understand basic principles of dietary content, such as calories, fat, sodium, cholesterol and nutrients.;Long Term Goal: Adherence to prescribed nutrition plan.             Nutrition Assessments:  Nutrition Assessments - 09/08/23 0909       Rate Your Plate Scores   Pre Score 53            MEDIFICTS Score Key: >=70 Need to make dietary changes  40-70 Heart Healthy Diet <= 40 Therapeutic Level Cholesterol Diet   Flowsheet Row INTENSIVE CARDIAC REHAB from 09/07/2023 in [facility]  Picture Your Plate Total Score on Admission 53      Picture Your Plate  Scores: <16 Unhealthy dietary pattern with much room for improvement. 41-50 Dietary pattern unlikely to meet recommendations for good health and room for improvement. 51-60 More healthful dietary pattern, with some room for improvement.  >60 Healthy dietary pattern, although there may be some specific behaviors that could be improved.    Nutrition Goals Re-Evaluation:  Nutrition Goals Re-Evaluation     Row Name 09/27/23 1419             Goals   Current Weight 206 lb 2.1 oz (93.5 kg)       Comment cholesterol 251, triglycerides 249, LDL 160       Expected Outcome Goals in progress. Tommy Page has medical history of STEMI. He continues to attend the [facility] education and nutrition series. Lipids/ LDL are not at goal; he continues crestor and diet modification. He is down 5.1# since starting with our program. He has been working toward smoking cessation. Patient will benefit from participation in intensive cardiac rehab for nutrition, exercise, and lifestyle modification.                Nutrition Goals Re-Evaluation:  Nutrition Goals Re-Evaluation  Row Name 09/27/23 1419             Goals   Current Weight 206 lb 2.1 oz (93.5 kg)       Comment cholesterol 251, triglycerides 249, LDL 160       Expected Outcome Goals in progress. Tommy Page has medical history of STEMI. He continues to attend the [facility] education and nutrition series. Lipids/ LDL are not at goal; he continues crestor and diet modification. He is down 5.1# since starting with our program. He has been working toward smoking cessation. Patient will benefit from participation in intensive cardiac rehab for nutrition, exercise, and lifestyle modification.                Nutrition Goals Discharge (Final Nutrition Goals Re-Evaluation):  Nutrition Goals Re-Evaluation - 09/27/23 1419       Goals   Current Weight 206 lb 2.1 oz (93.5 kg)    Comment cholesterol 251, triglycerides 249, LDL 160    Expected Outcome Goals  in progress. Tommy Page has medical history of STEMI. He continues to attend the [facility] education and nutrition series. Lipids/ LDL are not at goal; he continues crestor and diet modification. He is down 5.1# since starting with our program. He has been working toward smoking cessation. Patient will benefit from participation in intensive cardiac rehab for nutrition, exercise, and lifestyle modification.             Psychosocial: Target Goals: Acknowledge presence or absence of significant depression and/or stress, maximize coping skills, provide positive support system. Participant is able to verbalize types and ability to use techniques and skills needed for reducing stress and depression.  Initial Review & Psychosocial Screening:  Initial Psych Review & Screening - 08/23/23 1013       Initial Review   Current issues with History of Depression;Current Sleep Concerns   Pt on Lexapro, feels it working. Voices that he does not feel depressed.     Family Dynamics   Good Support System? Yes   Pt has his brother and his good friends Cindee Lame and Rob     Barriers   Psychosocial barriers to participate in program The patient should benefit from training in stress management and relaxation.      Screening Interventions   Interventions Encouraged to exercise             Quality of Life Scores:  Quality of Life - 08/23/23 1033       Quality of Life   Select Quality of Life      Quality of Life Scores   Health/Function Pre 13.8 %    Socioeconomic Pre 23 %    Psych/Spiritual Pre 14.79 %    Family Pre 28 %    GLOBAL Pre 17.36 %            Scores of 19 and below usually indicate a poorer quality of life in these areas.  A difference of  2-3 points is a clinically meaningful difference.  A difference of 2-3 points in the total score of the Quality of Life Index has been associated with significant improvement in overall quality of life, self-image, physical symptoms, and general health  in studies assessing change in quality of life.  PHQ-9: Review Flowsheet       08/23/2023  Depression screen PHQ 2/9  Decreased Interest 2  Down, Depressed, Hopeless 0  PHQ - 2 Score 2  Altered sleeping 2  Tired, decreased energy 1  Change in appetite 0  Feeling bad or failure about yourself  0  Trouble concentrating 0  Moving slowly or fidgety/restless 0  Suicidal thoughts 0  PHQ-9 Score 5  Difficult doing work/chores Not difficult at all   Interpretation of Total Score  Total Score Depression Severity:  1-4 = Minimal depression, 5-9 = Mild depression, 10-14 = Moderate depression, 15-19 = Moderately severe depression, 20-27 = Severe depression   Psychosocial Evaluation and Intervention:   Psychosocial Re-Evaluation:  Psychosocial Re-Evaluation     Row Name 09/27/23 1339 10/21/23 1253           Psychosocial Re-Evaluation   Current issues with History of Depression;Current Sleep Concerns History of Depression;Current Sleep Concerns      Comments Will review PHq2-9 and quality of life questionnaire in the upcoming week Quality of life questionnaire reviewed. Tommy Page denies being depressed currently. Tommy Page says the antidepressant he is taking is controlling his depression.Tommy Page has good support from his brother and a good friend who lives in the area. Tommy Page says he is dissatisfied with his health due to his recent MI and stent placement. Tommy Page says he has not smoked a cigarette in a while. Tommy Page says he is chewing a lot of nicotine gum. Tommy Page says that he is not as short of breath. Tommy Page uses his trelegy inhaler as needed. Tommy Page also reports that his energy level has increased since he has been participating in cardiac rehab      Expected Outcomes Tommy Page will have controlled or decreased depression upon completion of cardiac rehab. Tommy Page will have controlled or decreased depression upon completion of cardiac rehab.      Interventions Stress management education;Relaxation  education;Encouraged to attend Cardiac Rehabilitation for the exercise Stress management education;Relaxation education;Encouraged to attend Cardiac Rehabilitation for the exercise      Continue Psychosocial Services  Follow up required by staff Follow up required by staff               Psychosocial Discharge (Final Psychosocial Re-Evaluation):  Psychosocial Re-Evaluation - 10/21/23 1253       Psychosocial Re-Evaluation   Current issues with History of Depression;Current Sleep Concerns    Comments Quality of life questionnaire reviewed. Tommy Page denies being depressed currently. Tommy Page says the antidepressant he is taking is controlling his depression.Tommy Page has good support from his brother and a good friend who lives in the area. Tommy Page says he is dissatisfied with his health due to his recent MI and stent placement. Tommy Page says he has not smoked a cigarette in a while. Tommy Page says he is chewing a lot of nicotine gum. Tommy Page says that he is not as short of breath. Tommy Page uses his trelegy inhaler as needed. Tommy Page also reports that his energy level has increased since he has been participating in cardiac rehab    Expected Outcomes Tommy Page will have controlled or decreased depression upon completion of cardiac rehab.    Interventions Stress management education;Relaxation education;Encouraged to attend Cardiac Rehabilitation for the exercise    Continue Psychosocial Services  Follow up required by staff             Vocational Rehabilitation: Provide vocational rehab assistance to qualifying candidates.   Vocational Rehab Evaluation & Intervention:  Vocational Rehab - 08/23/23 1015       Initial Vocational Rehab Evaluation & Intervention   Assessment shows need for Vocational Rehabilitation No   Pt is employed            Education: Education Goals: Education classes will be provided on a  weekly basis, covering required topics. Participant will state understanding/return demonstration of  topics presented.    Education     Row Name 08/29/23 1600     Education   Cardiac Education Topics Pritikin   Geographical information systems officer Psychosocial   Psychosocial Workshop From Head to Heart: The Power of a Healthy Outlook   Instruction Review Code 1- Verbalizes Understanding   Class Start Time 1400   Class Stop Time 1446   Class Time Calculation (min) 46 min    Row Name 08/31/23 1400     Education   Cardiac Education Topics Pritikin   Secondary school teacher School   Educator Nurse;Respiratory Therapist   Weekly Topic Powerhouse Plant-Based Proteins   Instruction Review Code 1- Verbalizes Understanding   Class Start Time 1358   Class Stop Time 1430   Class Time Calculation (min) 32 min    Row Name 09/02/23 1300     Education   Cardiac Education Topics Pritikin   Hospital doctor Education   General Education Heart Disease Risk Reduction   Instruction Review Code 1- Verbalizes Understanding   Class Start Time 1350   Class Stop Time 1430   Class Time Calculation (min) 40 min    Row Name 09/05/23 1400     Education   Cardiac Education Topics Pritikin   Select Workshops     Workshops   Educator Exercise Physiologist   Select Exercise   Exercise Workshop Location manager and Fall Prevention   Instruction Review Code 1- Verbalizes Understanding   Class Start Time 1400   Class Stop Time 1445   Class Time Calculation (min) 45 min    Row Name 09/07/23 1500     Education   Cardiac Education Topics Pritikin   Customer service manager   Weekly Topic Adding Flavor - Sodium-Free   Instruction Review Code 1- Verbalizes Understanding   Class Start Time 1358   Class Stop Time 1436   Class Time Calculation (min) 38 min    Row Name 09/09/23 1500     Education   Cardiac Education Topics Pritikin    Licensed conveyancer Nutrition   Nutrition Overview of the Pritikin Eating Plan   Instruction Review Code 1- Verbalizes Understanding   Class Start Time 1357   Class Stop Time 1444   Class Time Calculation (min) 47 min    Row Name 09/12/23 1400     Education   Cardiac Education Topics Pritikin   Nurse, children's Exercise Physiologist   Select Psychosocial   Psychosocial Healthy Minds, Bodies, Hearts   Instruction Review Code 1- Verbalizes Understanding   Class Start Time 1409   Class Stop Time 1443   Class Time Calculation (min) 34 min    Row Name 09/19/23 1400     Education   Cardiac Education Topics Pritikin   Nurse, children's   Educator Nurse   Select Nutrition   Nutrition Becoming a Pritikin Chef   Instruction Review Code 1- Verbalizes Understanding   Class Start Time 1401   Class Stop Time 1436   Class Time Calculation (  min) 35 min    Row Name 09/23/23 1500     Education   Cardiac Education Topics Pritikin   Glass blower/designer Nutrition   Nutrition Workshop Label Reading   Instruction Review Code 1- Verbalizes Understanding   Class Start Time 1400   Class Stop Time 1440   Class Time Calculation (min) 40 min    Row Name 09/26/23 1400     Education   Cardiac Education Topics Pritikin   Select Workshops     Workshops   Educator Exercise Physiologist   Select Psychosocial   Psychosocial Workshop Recognizing and Reducing Stress   Instruction Review Code 1- Verbalizes Understanding   Class Start Time 1358   Class Stop Time 1445   Class Time Calculation (min) 47 min    Row Name 09/28/23 1500     Education   Cardiac Education Topics Pritikin   Medical laboratory scientific officer   Weekly Topic Delicious Desserts   Instruction Review Code 1- Verbalizes Understanding   Class Start Time  1355   Class Stop Time 1433   Class Time Calculation (min) 38 min    Row Name 09/30/23 1500     Education   Cardiac Education Topics Pritikin   Licensed conveyancer Nutrition   Nutrition Calorie Density   Instruction Review Code 1- Verbalizes Understanding   Class Start Time 1400   Class Stop Time 1435   Class Time Calculation (min) 35 min    Row Name 10/03/23 1500     Education   Cardiac Education Topics Pritikin   Banker Exercise Education   Exercise Education Exercise Action Plan   Instruction Review Code 1- Verbalizes Understanding   Class Start Time 1405   Class Stop Time 1455   Class Time Calculation (min) 50 min    Row Name 10/05/23 1400     Education   Cardiac Education Topics Pritikin   Customer service manager   Weekly Topic Efficiency Cooking - Meals in a Snap   Instruction Review Code 1- Verbalizes Understanding   Class Start Time 1345   Class Stop Time 1435   Class Time Calculation (min) 50 min    Row Name 10/10/23 1500     Education   Cardiac Education Topics Pritikin   Glass blower/designer Nutrition   Nutrition Workshop Targeting Your Nutrition Priorities   Instruction Review Code 1- Verbalizes Understanding   Class Start Time 1400   Class Stop Time 1440   Class Time Calculation (min) 40 min    Row Name 10/12/23 1500     Education   Cardiac Education Topics Pritikin   Orthoptist   Educator Dietitian   Weekly Topic One-Pot Wonders   Instruction Review Code 1- Verbalizes Understanding   Class Start Time 1400   Class Stop Time 1445   Class Time Calculation (min) 45 min    Row Name 10/14/23 1400     Education   Cardiac Education Topics Pritikin   Nurse, children's Exercise  Physiologist  Select General Education   General Education Hypertension and Heart Disease   Instruction Review Code 1- Verbalizes Understanding   Class Start Time 1405   Class Stop Time 1455   Class Time Calculation (min) 50 min    Row Name 10/17/23 1400     Education   Cardiac Education Topics Pritikin   Select Workshops     Workshops   Educator Exercise Physiologist   Select Psychosocial   Psychosocial Workshop Focused Goals, Sustainable Changes   Instruction Review Code 1- Verbalizes Understanding   Class Start Time 1345   Class Stop Time 1443   Class Time Calculation (min) 58 min    Row Name 10/19/23 1600     Education   Cardiac Education Topics Pritikin   Customer service manager   Weekly Topic Comforting Weekend Breakfasts   Instruction Review Code 1- Verbalizes Understanding   Class Start Time 1400   Class Stop Time 1440   Class Time Calculation (min) 40 min            Core Videos: Exercise    Move It!  Clinical staff conducted group or individual video education with verbal and written material and guidebook.  Patient learns the recommended Pritikin exercise program. Exercise with the goal of living a long, healthy life. Some of the health benefits of exercise include controlled diabetes, healthier blood pressure levels, improved cholesterol levels, improved heart and lung capacity, improved sleep, and better body composition. Everyone should speak with their doctor before starting or changing an exercise routine.  Biomechanical Limitations Clinical staff conducted group or individual video education with verbal and written material and guidebook.  Patient learns how biomechanical limitations can impact exercise and how we can mitigate and possibly overcome limitations to have an impactful and balanced exercise routine.  Body Composition Clinical staff conducted group or individual video education with verbal and  written material and guidebook.  Patient learns that body composition (ratio of muscle mass to fat mass) is a key component to assessing overall fitness, rather than body weight alone. Increased fat mass, especially visceral belly fat, can put [place] at increased risk for metabolic syndrome, type 2 diabetes, heart disease, and even death. It is recommended to combine diet and exercise (cardiovascular and resistance training) to improve your body composition. Seek guidance from your physician and exercise physiologist before implementing an exercise routine.  Exercise Action Plan Clinical staff conducted group or individual video education with verbal and written material and guidebook.  Patient learns the recommended strategies to achieve and enjoy long-term exercise adherence, including variety, self-motivation, self-efficacy, and positive decision making. Benefits of exercise include fitness, good health, weight management, more energy, better sleep, less stress, and overall well-being.  Medical   Heart Disease Risk Reduction Clinical staff conducted group or individual video education with verbal and written material and guidebook.  Patient learns our heart is our most vital organ as it circulates oxygen, nutrients, white blood cells, and hormones throughout the entire body, and carries waste away. Data supports a plant-based eating plan like the Pritikin Program for its effectiveness in slowing progression of and reversing heart disease. The video provides a number of recommendations to address heart disease.   Metabolic Syndrome and Belly Fat  Clinical staff conducted group or individual video education with verbal and written material and guidebook.  Patient learns what metabolic syndrome is, how it leads to heart disease, and how one can reverse it and  keep it from coming back. You have metabolic syndrome if you have 3 of the following 5 criteria: abdominal obesity, high blood pressure, high  triglycerides, low HDL cholesterol, and high blood sugar.  Hypertension and Heart Disease Clinical staff conducted group or individual video education with verbal and written material and guidebook.  Patient learns that high blood pressure, or hypertension, is very common in the [place]. Hypertension is largely due to excessive salt intake, but other important risk factors include being overweight, physical inactivity, drinking too much alcohol, smoking, and not eating enough potassium from fruits and vegetables. High blood pressure is a leading risk factor for heart attack, stroke, congestive heart failure, dementia, kidney failure, and premature death. Long-term effects of excessive salt intake include stiffening of the arteries and thickening of heart muscle and organ damage. Recommendations include ways to reduce hypertension and the risk of heart disease.  Diseases of Our Time - Focusing on Diabetes Clinical staff conducted group or individual video education with verbal and written material and guidebook.  Patient learns why the best way to stop diseases of our time is prevention, through food and other lifestyle changes. Medicine (such as prescription pills and surgeries) is often only a Band-Aid on the problem, not a long-term solution. Most common diseases of our time include obesity, type 2 diabetes, hypertension, heart disease, and cancer. The Pritikin Program is recommended and has been proven to help reduce, reverse, and/or prevent the damaging effects of metabolic syndrome.  Nutrition   Overview of the Pritikin Eating Plan  Clinical staff conducted group or individual video education with verbal and written material and guidebook.  Patient learns about the Pritikin Eating Plan for disease risk reduction. The Pritikin Eating Plan emphasizes a wide variety of unrefined, minimally-processed carbohydrates, like fruits, vegetables, whole grains, and legumes. Go, Caution, and Stop food  choices are explained. Plant-based and lean animal proteins are emphasized. Rationale provided for low sodium intake for blood pressure control, low added sugars for blood sugar stabilization, and low added fats and oils for coronary artery disease risk reduction and weight management.  Calorie Density  Clinical staff conducted group or individual video education with verbal and written material and guidebook.  Patient learns about calorie density and how it impacts the Pritikin Eating Plan. Knowing the characteristics of the food you choose will help you decide whether those foods will lead to weight gain or weight loss, and whether you want to consume more or less of them. Weight loss is usually a side effect of the Pritikin Eating Plan because of its focus on low calorie-dense foods.  Label Reading  Clinical staff conducted group or individual video education with verbal and written material and guidebook.  Patient learns about the Pritikin recommended label reading guidelines and corresponding recommendations regarding calorie density, added sugars, sodium content, and whole grains.  Dining Out - Part 1  Clinical staff conducted group or individual video education with verbal and written material and guidebook.  Patient learns that restaurant meals can be sabotaging because they can be so high in calories, fat, sodium, and/or sugar. Patient learns recommended strategies on how to positively address this and avoid unhealthy pitfalls.  Facts on Fats  Clinical staff conducted group or individual video education with verbal and written material and guidebook.  Patient learns that lifestyle modifications can be just as effective, if not more so, as many medications for lowering your risk of heart disease. A Pritikin lifestyle can help to reduce your risk of  inflammation and atherosclerosis (cholesterol build-up, or plaque, in the artery walls). Lifestyle interventions such as dietary choices and  physical activity address the cause of atherosclerosis. A review of the types of fats and their impact on blood cholesterol levels, along with dietary recommendations to reduce fat intake is also included.  Nutrition Action Plan  Clinical staff conducted group or individual video education with verbal and written material and guidebook.  Patient learns how to incorporate Pritikin recommendations into their lifestyle. Recommendations include planning and keeping personal health goals in mind as an important part of their success.  Healthy Mind-Set    Healthy Minds, Bodies, Hearts  Clinical staff conducted group or individual video education with verbal and written material and guidebook.  Patient learns how to identify when they are stressed. Video will discuss the impact of that stress, as well as the many benefits of stress management. Patient will also be introduced to stress management techniques. The way we think, act, and feel has an impact on our hearts.  How Our Thoughts Can Heal Our Hearts  Clinical staff conducted group or individual video education with verbal and written material and guidebook.  Patient learns that negative thoughts can cause depression and anxiety. This can result in negative lifestyle behavior and serious health problems. Cognitive behavioral therapy is an effective method to help control our thoughts in order to change and improve our emotional outlook.  Additional Videos:  Exercise    Improving Performance  Clinical staff conducted group or individual video education with verbal and written material and guidebook.  Patient learns to use a non-linear approach by alternating intensity levels and lengths of time spent exercising to help burn more calories and lose more body fat. Cardiovascular exercise helps improve heart health, metabolism, hormonal balance, blood sugar control, and recovery from fatigue. Resistance training improves strength, endurance, balance,  coordination, reaction time, metabolism, and muscle mass. Flexibility exercise improves circulation, posture, and balance. Seek guidance from your physician and exercise physiologist before implementing an exercise routine and learn your capabilities and proper form for all exercise.  Introduction to Yoga  Clinical staff conducted group or individual video education with verbal and written material and guidebook.  Patient learns about yoga, a discipline of the coming together of mind, breath, and body. The benefits of yoga include improved flexibility, improved range of motion, better posture and core strength, increased lung function, weight loss, and positive self-image. Yoga's heart health benefits include lowered blood pressure, healthier heart rate, decreased cholesterol and triglyceride levels, improved immune function, and reduced stress. Seek guidance from your physician and exercise physiologist before implementing an exercise routine and learn your capabilities and proper form for all exercise.  Medical   Aging: Enhancing Your Quality of Life  Clinical staff conducted group or individual video education with verbal and written material and guidebook.  Patient learns key strategies and recommendations to stay in good physical health and enhance quality of life, such as prevention strategies, having an advocate, securing a Health Care Proxy and Power of Attorney, and keeping a list of medications and system for tracking them. It also discusses how to avoid risk for bone loss.  Biology of Weight Control  Clinical staff conducted group or individual video education with verbal and written material and guidebook.  Patient learns that weight gain occurs because we consume more calories than we burn (eating more, moving less). Even if your body weight is normal, you may have higher ratios of fat compared to muscle mass. Too  much body fat puts you at increased risk for cardiovascular disease, heart  attack, stroke, type 2 diabetes, and obesity-related cancers. In addition to exercise, following the Pritikin Eating Plan can help reduce your risk.  Decoding Lab Results  Clinical staff conducted group or individual video education with verbal and written material and guidebook.  Patient learns that lab test reflects one measurement whose values change over time and are influenced by many factors, including medication, stress, sleep, exercise, food, hydration, pre-existing medical conditions, and more. It is recommended to use the knowledge from this video to become more involved with your lab results and evaluate your numbers to speak with your doctor.   Diseases of Our Time - Overview  Clinical staff conducted group or individual video education with verbal and written material and guidebook.  Patient learns that according to the CDC, 50% to 70% of chronic diseases (such as obesity, type 2 diabetes, elevated lipids, hypertension, and heart disease) are avoidable through lifestyle improvements including healthier food choices, listening to satiety cues, and increased physical activity.  Sleep Disorders Clinical staff conducted group or individual video education with verbal and written material and guidebook.  Patient learns how good quality and duration of sleep are important to overall health and well-being. Patient also learns about sleep disorders and how they impact health along with recommendations to address them, including discussing with a physician.  Nutrition  Dining Out - Part 2 Clinical staff conducted group or individual video education with verbal and written material and guidebook.  Patient learns how to plan ahead and communicate in order to maximize their dining experience in a healthy and nutritious manner. Included are recommended food choices based on the type of restaurant the patient is visiting.   Fueling a Banker conducted group or individual  video education with verbal and written material and guidebook.  There is a strong connection between our food choices and our health. Diseases like obesity and type 2 diabetes are very prevalent and are in large-part due to lifestyle choices. The Pritikin Eating Plan provides plenty of food and hunger-curbing satisfaction. It is easy to follow, affordable, and helps reduce health risks.  Menu Workshop  Clinical staff conducted group or individual video education with verbal and written material and guidebook.  Patient learns that restaurant meals can sabotage health goals because they are often packed with calories, fat, sodium, and sugar. Recommendations include strategies to plan ahead and to communicate with the manager, chef, or server to help order a healthier meal.  Planning Your Eating Strategy  Clinical staff conducted group or individual video education with verbal and written material and guidebook.  Patient learns about the Pritikin Eating Plan and its benefit of reducing the risk of disease. The Pritikin Eating Plan does not focus on calories. Instead, it emphasizes high-quality, nutrient-rich foods. By knowing the characteristics of the foods, we choose, we can determine their calorie density and make informed decisions.  Targeting Your Nutrition Priorities  Clinical staff conducted group or individual video education with verbal and written material and guidebook.  Patient learns that lifestyle habits have a tremendous impact on disease risk and progression. This video provides eating and physical activity recommendations based on your personal health goals, such as reducing LDL cholesterol, losing weight, preventing or controlling type 2 diabetes, and reducing high blood pressure.  Vitamins and Minerals  Clinical staff conducted group or individual video education with verbal and written material and guidebook.  Patient learns different ways  to obtain key vitamins and minerals,  including through a recommended healthy diet. It is important to discuss all supplements you take with your doctor.   Healthy Mind-Set    Smoking Cessation  Clinical staff conducted group or individual video education with verbal and written material and guidebook.  Patient learns that cigarette smoking and tobacco addiction pose a serious health risk which affects millions of people. Stopping smoking will significantly reduce the risk of heart disease, lung disease, and many forms of cancer. Recommended strategies for quitting are covered, including working with your doctor to develop a successful plan.  Culinary   Becoming a Set designer conducted group or individual video education with verbal and written material and guidebook.  Patient learns that cooking at home can be healthy, cost-effective, quick, and puts them in control. Keys to cooking healthy recipes will include looking at your recipe, assessing your equipment needs, planning ahead, making it simple, choosing cost-effective seasonal ingredients, and limiting the use of added fats, salts, and sugars.  Cooking - Breakfast and Snacks  Clinical staff conducted group or individual video education with verbal and written material and guidebook.  Patient learns how important breakfast is to satiety and nutrition through the entire day. Recommendations include key foods to eat during breakfast to help stabilize blood sugar levels and to prevent overeating at meals later in the day. Planning ahead is also a key component.  Cooking - Educational psychologist conducted group or individual video education with verbal and written material and guidebook.  Patient learns eating strategies to improve overall health, including an approach to cook more at home. Recommendations include thinking of animal protein as a side on your plate rather than center stage and focusing instead on lower calorie dense options like vegetables,  fruits, whole grains, and plant-based proteins, such as beans. Making sauces in large quantities to freeze for later and leaving the skin on your vegetables are also recommended to maximize your experience.  Cooking - Healthy Salads and Dressing Clinical staff conducted group or individual video education with verbal and written material and guidebook.  Patient learns that vegetables, fruits, whole grains, and legumes are the foundations of the Pritikin Eating Plan. Recommendations include how to incorporate each of these in flavorful and healthy salads, and how to create homemade salad dressings. Proper handling of ingredients is also covered. Cooking - Soups and [employer] - Soups and Desserts Clinical staff conducted group or individual video education with verbal and written material and guidebook.  Patient learns that Pritikin soups and desserts make for easy, nutritious, and delicious snacks and meal components that are low in sodium, fat, sugar, and calorie density, while high in vitamins, minerals, and filling fiber. Recommendations include simple and healthy ideas for soups and desserts.   Overview     The Pritikin Solution Program Overview Clinical staff conducted group or individual video education with verbal and written material and guidebook.  Patient learns that the results of the Pritikin Program have been documented in more than 100 articles published in peer-reviewed journals, and the benefits include reducing risk factors for (and, in some cases, even reversing) high cholesterol, high blood pressure, type 2 diabetes, obesity, and more! An overview of the three key pillars of the Pritikin Program will be covered: eating well, doing regular exercise, and having a healthy mind-set.  WORKSHOPS  Exercise: Exercise Basics: Building Your Action Plan Clinical staff led group instruction and group discussion with PowerPoint  presentation and patient guidebook. To enhance the  learning environment the use of posters, models and videos may be added. At the conclusion of this workshop, patients will comprehend the difference between physical activity and exercise, as well as the benefits of incorporating both, into their routine. Patients will understand the FITT (Frequency, Intensity, Time, and Type) principle and how to use it to build an exercise action plan. In addition, safety concerns and other considerations for exercise and cardiac rehab will be addressed by the presenter. The purpose of this lesson is to promote a comprehensive and effective weekly exercise routine in order to improve patients' overall level of fitness.   Managing Heart Disease: Your Path to a Healthier Heart Clinical staff led group instruction and group discussion with PowerPoint presentation and patient guidebook. To enhance the learning environment the use of posters, models and videos may be added.At the conclusion of this workshop, patients will understand the anatomy and physiology of the heart. Additionally, they will understand how Pritikin's three pillars impact the risk factors, the progression, and the management of heart disease.  The purpose of this lesson is to provide a high-level overview of the heart, heart disease, and how the Pritikin lifestyle positively impacts risk factors.  Exercise Biomechanics Clinical staff led group instruction and group discussion with PowerPoint presentation and patient guidebook. To enhance the learning environment the use of posters, models and videos may be added. Patients will learn how the structural parts of their bodies function and how these functions impact their daily activities, movement, and exercise. Patients will learn how to promote a neutral spine, learn how to manage pain, and identify ways to improve their physical movement in order to promote healthy living. The purpose of this lesson is to expose patients to common  physical limitations that impact physical activity. Participants will learn practical ways to adapt and manage aches and pains, and to minimize their effect on regular exercise. Patients will learn how to maintain good posture while sitting, walking, and lifting.  Balance Training and Fall Prevention  Clinical staff led group instruction and group discussion with PowerPoint presentation and patient guidebook. To enhance the learning environment the use of posters, models and videos may be added. At the conclusion of this workshop, patients will understand the importance of their sensorimotor skills (vision, proprioception, and the vestibular system) in maintaining their ability to balance as they age. Patients will apply a variety of balancing exercises that are appropriate for their current level of function. Patients will understand the common causes for poor balance, possible solutions to these problems, and ways to modify their physical environment in order to minimize their fall risk. The purpose of this lesson is to teach patients about the importance of maintaining balance as they age and ways to minimize their risk of falling.  WORKSHOPS   Nutrition:  Fueling a Ship broker led group instruction and group discussion with PowerPoint presentation and patient guidebook. To enhance the learning environment the use of posters, models and videos may be added. Patients will review the foundational principles of the Pritikin Eating Plan and understand what constitutes a serving size in each of the food groups. Patients will also learn Pritikin-friendly foods that are better choices when away from home and review make-ahead meal and snack options. Calorie density will be reviewed and applied to three nutrition priorities: weight maintenance, weight loss, and weight gain. The purpose of this lesson is to reinforce (in a group setting) the key concepts around  what patients are  recommended to eat and how to apply these guidelines when away from home by planning and selecting Pritikin-friendly options. Patients will understand how calorie density may be adjusted for different weight management goals.  Mindful Eating  Clinical staff led group instruction and group discussion with PowerPoint presentation and patient guidebook. To enhance the learning environment the use of posters, models and videos may be added. Patients will briefly review the concepts of the Pritikin Eating Plan and the importance of low-calorie dense foods. The concept of mindful eating will be introduced as well as the importance of paying attention to internal hunger signals. Triggers for non-hunger eating and techniques for dealing with triggers will be explored. The purpose of this lesson is to provide patients with the opportunity to review the basic principles of the Pritikin Eating Plan, discuss the value of eating mindfully and how to measure internal cues of hunger and fullness using the Hunger Scale. Patients will also discuss reasons for non-hunger eating and learn strategies to use for controlling emotional eating.  Targeting Your Nutrition Priorities Clinical staff led group instruction and group discussion with PowerPoint presentation and patient guidebook. To enhance the learning environment the use of posters, models and videos may be added. Patients will learn how to determine their genetic susceptibility to disease by reviewing their family history. Patients will gain insight into the importance of diet as part of an overall healthy lifestyle in mitigating the impact of genetics and other environmental insults. The purpose of this lesson is to provide patients with the opportunity to assess their personal nutrition priorities by looking at their family history, their own health history and current risk factors. Patients will also be able to discuss ways of prioritizing and modifying the Pritikin  Eating Plan for their highest risk areas  Menu  Clinical staff led group instruction and group discussion with PowerPoint presentation and patient guidebook. To enhance the learning environment the use of posters, models and videos may be added. Using menus brought in from [employer], or printed from [employer], patients will apply the Pritikin dining out guidelines that were presented in the [employer] video. Patients will also be able to practice these guidelines in a variety of provided scenarios. The purpose of this lesson is to provide patients with the opportunity to practice hands-on learning of the Pritikin Dining Out guidelines with actual menus and practice scenarios.  Label Reading Clinical staff led group instruction and group discussion with PowerPoint presentation and patient guidebook. To enhance the learning environment the use of posters, models and videos may be added. Patients will review and discuss the Pritikin label reading guidelines presented in Pritikin's Label Reading Educational series video. Using fool labels brought in from local grocery stores and markets, patients will apply the label reading guidelines and determine if the packaged food meet the Pritikin guidelines. The purpose of this lesson is to provide patients with the opportunity to review, discuss, and practice hands-on learning of the Pritikin Label Reading guidelines with actual packaged food labels. Cooking School  Pritikin's [employer] are designed to teach patients ways to prepare quick, simple, and affordable recipes at home. The importance of nutrition's role in chronic disease risk reduction is reflected in its emphasis in the overall Pritikin program. By learning how to prepare essential core Pritikin Eating Plan recipes, patients will increase control over what they eat; be able to customize the flavor of foods without the use of added salt, sugar, or  fat; and  improve the quality of the food they consume. By learning a set of core recipes which are easily assembled, quickly prepared, and affordable, patients are more likely to prepare more healthy foods at home. These workshops focus on convenient breakfasts, simple entres, side dishes, and desserts which can be prepared with minimal effort and are consistent with nutrition recommendations for cardiovascular risk reduction. Cooking [employer] are taught by a Armed forces logistics/support/administrative officer (RD) who has been trained by the [employer]. The chef or RD has a clear understanding of the importance of minimizing - if not completely eliminating - added fat, sugar, and sodium in recipes. Throughout the series of Cooking School Workshop sessions, patients will learn about healthy ingredients and efficient methods of cooking to build confidence in their capability to prepare    Cooking School weekly topics:  Adding Flavor- Sodium-Free  Fast and Healthy Breakfasts  Powerhouse Plant-Based Proteins  Satisfying Salads and Dressings  Simple Sides and Sauces  International Cuisine-Spotlight on the [employer] Zones  Delicious Desserts  Savory Soups  [employer] - Meals in a Astronomer Appetizers and Snacks  Comforting Weekend Breakfasts  One-Pot Wonders   Fast Evening Meals  Landscape architect Your Pritikin Plate  WORKSHOPS   Healthy Mindset (Psychosocial):  Focused Goals, Sustainable Changes Clinical staff led group instruction and group discussion with PowerPoint presentation and patient guidebook. To enhance the learning environment the use of posters, models and videos may be added. Patients will be able to apply effective goal setting strategies to establish at least one personal goal, and then take consistent, meaningful action toward that goal. They will learn to identify common barriers to achieving personal goals and develop strategies to overcome them. Patients will  also gain an understanding of how our mind-set can impact our ability to achieve goals and the importance of cultivating a positive and growth-oriented mind-set. The purpose of this lesson is to provide patients with a deeper understanding of how to set and achieve personal goals, as well as the tools and strategies needed to overcome common obstacles which may arise along the way.  From Head to Heart: The Power of a Healthy Outlook  Clinical staff led group instruction and group discussion with PowerPoint presentation and patient guidebook. To enhance the learning environment the use of posters, models and videos may be added. Patients will be able to recognize and describe the impact of emotions and mood on physical health. They will discover the importance of self-care and explore self-care practices which may work for them. Patients will also learn how to utilize the 4 C's to cultivate a healthier outlook and better manage stress and challenges. The purpose of this lesson is to demonstrate to patients how a healthy outlook is an essential part of maintaining good health, especially as they continue their cardiac rehab journey.  Healthy Sleep for a Healthy Heart Clinical staff led group instruction and group discussion with PowerPoint presentation and patient guidebook. To enhance the learning environment the use of posters, models and videos may be added. At the conclusion of this workshop, patients will be able to demonstrate knowledge of the importance of sleep to overall health, well-being, and quality of life. They will understand the symptoms of, and treatments for, common sleep disorders. Patients will also be able to identify daytime and nighttime behaviors which impact sleep, and they will be able to apply these tools to help manage sleep-related challenges. The purpose of this lesson  is to provide patients with a general overview of sleep and outline the importance of quality sleep. Patients will  learn about a few of the most common sleep disorders. Patients will also be introduced to the concept of "sleep hygiene," and discover ways to self-manage certain sleeping problems through simple daily behavior changes. Finally, the workshop will motivate patients by clarifying the links between quality sleep and their goals of heart-healthy living.   Recognizing and Reducing Stress Clinical staff led group instruction and group discussion with PowerPoint presentation and patient guidebook. To enhance the learning environment the use of posters, models and videos may be added. At the conclusion of this workshop, patients will be able to understand the types of stress reactions, differentiate between acute and chronic stress, and recognize the impact that chronic stress has on their health. They will also be able to apply different coping mechanisms, such as reframing negative self-talk. Patients will have the opportunity to practice a variety of stress management techniques, such as deep abdominal breathing, progressive muscle relaxation, and/or guided imagery.  The purpose of this lesson is to educate patients on the role of stress in their lives and to provide healthy techniques for coping with it.  Learning Barriers/Preferences:  Learning Barriers/Preferences - 08/23/23 1032       Learning Barriers/Preferences   Learning Barriers Hearing;Sight   wears glasses and bilateral hearing aids   Learning Preferences Audio;Group Instruction;Individual Instruction;Skilled Demonstration;Verbal Instruction             Education Topics:  Knowledge Questionnaire Score:  Knowledge Questionnaire Score - 08/23/23 1032       Knowledge Questionnaire Score   Pre Score 27/28             Core Components/Risk Factors/Patient Goals at Admission:  Personal Goals and Risk Factors at Admission - 08/23/23 1016       Core Components/Risk Factors/Patient Goals on Admission    Weight Management Yes;Weight  Loss    Intervention Weight Management: Develop a combined nutrition and exercise program designed to reach desired caloric intake, while maintaining appropriate intake of nutrient and fiber, sodium and fats, and appropriate energy expenditure required for the weight goal.;Weight Management: Provide education and appropriate resources to help participant work on and attain dietary goals.;Weight Management/Obesity: Establish reasonable short term and long term weight goals.    Admit Weight 211 lb 3.2 oz (95.8 kg)    Goal Weight: Short Term 196 lb (88.9 kg)    Expected Outcomes Short Term: Continue to assess and modify interventions until short term weight is achieved;Long Term: Adherence to nutrition and physical activity/exercise program aimed toward attainment of established weight goal;Weight Loss: Understanding of general recommendations for a balanced deficit meal plan, which promotes 1-2 lb weight loss per week and includes a negative energy balance of [phone] kcal/d;Understanding recommendations for meals to include 15-35% energy as protein, 25-35% energy from fat, 35-60% energy from carbohydrates, less than 200mg  of dietary cholesterol, 20-35 gm of total fiber daily;Understanding of distribution of calorie intake throughout the day with the consumption of 4-5 meals/snacks    Tobacco Cessation Yes    Number of packs per day 0    Intervention Assist the participant in steps to quit. Provide individualized education and counseling about committing to Tobacco Cessation, relapse prevention, and pharmacological support that can be provided by physician.;Education officer, environmental, assist with locating and accessing local/national Quit Smoking programs, and support quit date choice.   Pt quit smoking 06/27/23   Expected Outcomes Long  Term: Complete abstinence from all tobacco products for at least 12 months from quit date.    Hypertension Yes    Intervention Provide education on lifestyle modifcations  including regular physical activity/exercise, weight management, moderate sodium restriction and increased consumption of fresh fruit, vegetables, and low fat dairy, alcohol moderation, and smoking cessation.;Monitor prescription use compliance.    Expected Outcomes Short Term: Continued assessment and intervention until BP is < 140/69mm HG in hypertensive participants. < 130/57mm HG in hypertensive participants with diabetes, heart failure or chronic kidney disease.;Long Term: Maintenance of blood pressure at goal levels.    Lipids Yes    Intervention Provide education and support for participant on nutrition & aerobic/resistive exercise along with prescribed medications to achieve LDL 70mg , HDL >40mg .    Expected Outcomes Short Term: Participant states understanding of desired cholesterol values and is compliant with medications prescribed. Participant is following exercise prescription and nutrition guidelines.;Long Term: Cholesterol controlled with medications as prescribed, with individualized exercise RX and with personalized nutrition plan. Value goals: LDL < 70mg , HDL > 40 mg.             Core Components/Risk Factors/Patient Goals Review:   Goals and Risk Factor Review     Row Name 09/27/23 1342 10/21/23 1254           Core Components/Risk Factors/Patient Goals Review   Personal Goals Review Weight Management/Obesity;Hypertension;Lipids;Tobacco Cessation Weight Management/Obesity;Hypertension;Lipids;Tobacco Cessation      Review Tommy Page is doing well with exercise. Vital signs have been stable. Tommy Page has lost 2.3 kg since starting cardiac rehab. Tommy Page is doing well with exercise. Vital signs have been stable. Tommy Page has lost 4.1 kg since starting cardiac rehab. Tommy Page says he is using a lot of nicotene gum, Will continue to encourage smoking cessation      Expected Outcomes Tommy Page will continue to participate in cardiac rehab for exercise, nutrition and lifestyle modifications. Tommy Page will  continue to participate in cardiac rehab for exercise, nutrition and lifestyle modifications.               Core Components/Risk Factors/Patient Goals at Discharge (Final Review):   Goals and Risk Factor Review - 10/21/23 1254       Core Components/Risk Factors/Patient Goals Review   Personal Goals Review Weight Management/Obesity;Hypertension;Lipids;Tobacco Cessation    Review Tommy Page is doing well with exercise. Vital signs have been stable. Tommy Page has lost 4.1 kg since starting cardiac rehab. Tommy Page says he is using a lot of nicotene gum, Will continue to encourage smoking cessation    Expected Outcomes Tommy Page will continue to participate in cardiac rehab for exercise, nutrition and lifestyle modifications.             ITP Comments:  ITP Comments     Row Name 08/23/23 1008 08/29/23 1715 09/27/23 1337 10/21/23 1252     ITP Comments Armanda Magic, MD: Medical Director.  Introduction to the Pritikin Education Program/Intensive Cardiac Rehab.  Initial orientation packet reviewed witht he patient. 30 Day ITP Review. Vinny started cardiac rehab on 08/29/23. Vinney did well with exercise. 30 Day ITP Review. Tommy Page has good attendance and participation with exercise at cardiac rehab 30 Day ITP Review. Tommy Page continues to have good attendance and participation with exercise at cardiac rehab             Comments: See ITP comments.Thayer Headings RN BSN

## 2023-10-24 ENCOUNTER — Encounter (HOSPITAL_COMMUNITY)
Admission: RE | Admit: 2023-10-24 | Discharge: 2023-10-24 | Disposition: A | Discharge status: Home / Self Care | Payer: BC Managed Care – PPO | Source: Ambulatory Visit | Attending: Cardiology

## 2023-10-24 ENCOUNTER — Ambulatory Visit (HOSPITAL_COMMUNITY): Payer: BC Managed Care – PPO

## 2023-10-24 DIAGNOSIS — Z955 Presence of coronary angioplasty implant and graft: Secondary | ICD-10-CM | POA: Diagnosis not present

## 2023-10-24 DIAGNOSIS — Z48812 Encounter for surgical aftercare following surgery on the circulatory system: Secondary | ICD-10-CM | POA: Diagnosis not present

## 2023-10-24 DIAGNOSIS — I252 Old myocardial infarction: Secondary | ICD-10-CM | POA: Diagnosis not present

## 2023-10-24 DIAGNOSIS — I2111 ST elevation (STEMI) myocardial infarction involving right coronary artery: Secondary | ICD-10-CM

## 2023-10-26 ENCOUNTER — Encounter (HOSPITAL_COMMUNITY)
Admission: RE | Admit: 2023-10-26 | Discharge: 2023-10-26 | Disposition: A | Discharge status: Home / Self Care | Payer: BC Managed Care – PPO | Source: Ambulatory Visit | Attending: Cardiology | Admitting: Cardiology

## 2023-10-26 ENCOUNTER — Ambulatory Visit (HOSPITAL_COMMUNITY): Payer: BC Managed Care – PPO

## 2023-10-26 DIAGNOSIS — I2111 ST elevation (STEMI) myocardial infarction involving right coronary artery: Secondary | ICD-10-CM | POA: Diagnosis not present

## 2023-10-26 DIAGNOSIS — Z1330 Encounter for screening examination for mental health and behavioral disorders, unspecified: Secondary | ICD-10-CM | POA: Diagnosis not present

## 2023-10-26 DIAGNOSIS — Z955 Presence of coronary angioplasty implant and graft: Secondary | ICD-10-CM | POA: Diagnosis not present

## 2023-10-26 DIAGNOSIS — I25118 Atherosclerotic heart disease of native coronary artery with other forms of angina pectoris: Secondary | ICD-10-CM | POA: Diagnosis not present

## 2023-10-26 DIAGNOSIS — I252 Old myocardial infarction: Secondary | ICD-10-CM | POA: Diagnosis not present

## 2023-10-28 ENCOUNTER — Ambulatory Visit (HOSPITAL_COMMUNITY): Payer: BC Managed Care – PPO

## 2023-10-28 ENCOUNTER — Encounter (HOSPITAL_COMMUNITY)
Admission: RE | Admit: 2023-10-28 | Discharge: 2023-10-28 | Disposition: A | Discharge status: Home / Self Care | Payer: BC Managed Care – PPO | Source: Ambulatory Visit | Attending: Cardiology | Admitting: Cardiology

## 2023-10-28 DIAGNOSIS — I2111 ST elevation (STEMI) myocardial infarction involving right coronary artery: Secondary | ICD-10-CM | POA: Diagnosis not present

## 2023-10-28 DIAGNOSIS — Z955 Presence of coronary angioplasty implant and graft: Secondary | ICD-10-CM | POA: Diagnosis not present

## 2023-10-28 DIAGNOSIS — I25118 Atherosclerotic heart disease of native coronary artery with other forms of angina pectoris: Secondary | ICD-10-CM | POA: Diagnosis not present

## 2023-10-28 DIAGNOSIS — I252 Old myocardial infarction: Secondary | ICD-10-CM | POA: Diagnosis not present

## 2023-10-31 ENCOUNTER — Ambulatory Visit (HOSPITAL_COMMUNITY): Payer: BC Managed Care – PPO

## 2023-10-31 ENCOUNTER — Encounter (HOSPITAL_COMMUNITY)
Admission: RE | Admit: 2023-10-31 | Discharge: 2023-10-31 | Disposition: A | Discharge status: Home / Self Care | Payer: BC Managed Care – PPO | Source: Ambulatory Visit | Attending: Cardiology | Admitting: Cardiology

## 2023-10-31 DIAGNOSIS — I2111 ST elevation (STEMI) myocardial infarction involving right coronary artery: Secondary | ICD-10-CM | POA: Diagnosis not present

## 2023-10-31 DIAGNOSIS — Z955 Presence of coronary angioplasty implant and graft: Secondary | ICD-10-CM

## 2023-11-02 ENCOUNTER — Encounter (HOSPITAL_COMMUNITY)
Admission: RE | Admit: 2023-11-02 | Discharge: 2023-11-02 | Disposition: A | Discharge status: Home / Self Care | Payer: BC Managed Care – PPO | Source: Ambulatory Visit | Attending: Cardiology | Admitting: Cardiology

## 2023-11-02 ENCOUNTER — Ambulatory Visit (HOSPITAL_COMMUNITY): Payer: BC Managed Care – PPO

## 2023-11-02 DIAGNOSIS — I2111 ST elevation (STEMI) myocardial infarction involving right coronary artery: Secondary | ICD-10-CM

## 2023-11-02 DIAGNOSIS — Z955 Presence of coronary angioplasty implant and graft: Secondary | ICD-10-CM

## 2023-11-04 ENCOUNTER — Encounter (HOSPITAL_COMMUNITY)
Admission: RE | Admit: 2023-11-04 | Discharge: 2023-11-04 | Disposition: A | Discharge status: Home / Self Care | Payer: BC Managed Care – PPO | Source: Ambulatory Visit | Attending: Cardiology | Admitting: Cardiology

## 2023-11-04 ENCOUNTER — Ambulatory Visit (HOSPITAL_COMMUNITY): Payer: BC Managed Care – PPO

## 2023-11-04 DIAGNOSIS — Z955 Presence of coronary angioplasty implant and graft: Secondary | ICD-10-CM

## 2023-11-04 DIAGNOSIS — I2111 ST elevation (STEMI) myocardial infarction involving right coronary artery: Secondary | ICD-10-CM

## 2023-11-07 ENCOUNTER — Ambulatory Visit (HOSPITAL_COMMUNITY): Payer: BC Managed Care – PPO

## 2023-11-07 ENCOUNTER — Encounter (HOSPITAL_COMMUNITY)
Admission: RE | Admit: 2023-11-07 | Discharge: 2023-11-07 | Disposition: A | Discharge status: Home / Self Care | Payer: BC Managed Care – PPO | Source: Ambulatory Visit | Attending: Cardiology | Admitting: Cardiology

## 2023-11-07 DIAGNOSIS — Z955 Presence of coronary angioplasty implant and graft: Secondary | ICD-10-CM | POA: Diagnosis not present

## 2023-11-07 DIAGNOSIS — I2111 ST elevation (STEMI) myocardial infarction involving right coronary artery: Secondary | ICD-10-CM | POA: Diagnosis not present

## 2023-11-09 ENCOUNTER — Ambulatory Visit (HOSPITAL_COMMUNITY): Payer: BC Managed Care – PPO

## 2023-11-09 ENCOUNTER — Encounter (HOSPITAL_COMMUNITY)
Admission: RE | Admit: 2023-11-09 | Discharge: 2023-11-09 | Disposition: A | Discharge status: Home / Self Care | Payer: BC Managed Care – PPO | Source: Ambulatory Visit | Attending: Cardiology | Admitting: Cardiology

## 2023-11-09 DIAGNOSIS — I2111 ST elevation (STEMI) myocardial infarction involving right coronary artery: Secondary | ICD-10-CM

## 2023-11-09 DIAGNOSIS — Z955 Presence of coronary angioplasty implant and graft: Secondary | ICD-10-CM

## 2023-11-11 ENCOUNTER — Encounter (HOSPITAL_COMMUNITY): Admission: RE | Admit: 2023-11-11 | Payer: BC Managed Care – PPO | Source: Ambulatory Visit

## 2023-11-11 ENCOUNTER — Ambulatory Visit (HOSPITAL_COMMUNITY): Payer: BC Managed Care – PPO

## 2023-11-11 ENCOUNTER — Telehealth (HOSPITAL_COMMUNITY): Payer: Self-pay

## 2023-11-11 NOTE — Telephone Encounter (Signed)
 Patient c/o for 1:45pm class, 'caught up at work'.

## 2023-11-14 ENCOUNTER — Ambulatory Visit (HOSPITAL_COMMUNITY): Payer: BC Managed Care – PPO

## 2023-11-14 ENCOUNTER — Encounter (HOSPITAL_COMMUNITY)
Admission: RE | Admit: 2023-11-14 | Discharge: 2023-11-14 | Disposition: A | Discharge status: Home / Self Care | Payer: BC Managed Care – PPO | Source: Ambulatory Visit | Attending: Cardiology

## 2023-11-14 VITALS — Ht 72.0 in | Wt 204.6 lb

## 2023-11-14 DIAGNOSIS — Z955 Presence of coronary angioplasty implant and graft: Secondary | ICD-10-CM | POA: Diagnosis not present

## 2023-11-14 DIAGNOSIS — I2111 ST elevation (STEMI) myocardial infarction involving right coronary artery: Secondary | ICD-10-CM | POA: Diagnosis not present

## 2023-11-15 NOTE — Progress Notes (Signed)
 Cardiac Individual Treatment Plan  Patient Details  Name: Tommy Page MRN: [id number] Date of Birth: 08-Apr-1961 Referring Provider:   Flowsheet Row INTENSIVE CARDIAC REHAB ORIENT from 08/23/2023 in [facility]  Referring Provider Dr. Jac Martes, MD covering)       Initial Encounter Date:  Flowsheet Row INTENSIVE CARDIAC REHAB ORIENT from 08/23/2023 in [facility]  Date 08/23/23       Visit Diagnosis: 06/27/23 ST elevation myocardial infarction involving right coronary artery (HCC)  06/27/23 Status post coronary artery stent placement  Patient's Home Medications on Admission:  Current Outpatient Medications:    ASPIRIN LOW DOSE 81 MG tablet, Take 81 mg by mouth daily., Disp: , Rfl:    BRILINTA 90 MG TABS tablet, Take 90 mg by mouth 2 (two) times daily., Disp: , Rfl:    escitalopram (LEXAPRO) 20 MG tablet, Take 20 mg by mouth daily., Disp: , Rfl:    Fluticasone-Umeclidin-Vilant (TRELEGY ELLIPTA) 100-62.5-25 MCG/ACT AEPB, Inhale 100 mcg into the lungs once as needed., Disp: , Rfl:    hydrocortisone  (ANUSOL -HC) 2.5 % rectal cream, Place 1 application rectally 2 (two) times daily. Apply around anus for irritated & painful hemorrhoids, Disp: 15 g, Rfl: 2   metoprolol succinate (TOPROL-XL) 25 MG 24 hr tablet, Take 12.5 mg by mouth daily., Disp: , Rfl:    nicotine  (NICODERM CQ  - DOSED IN MG/24 HOURS) 21 mg/24hr patch, Place 21 mg onto the skin daily., Disp: , Rfl:    nicotine  polacrilex (NICORETTE ) 2 MG gum, Take 2 mg by mouth as needed for smoking cessation., Disp: , Rfl:    omeprazole (PRILOSEC OTC) 20 MG tablet, Take 20 mg by mouth daily., Disp: , Rfl:    PROBIOTIC PRODUCT PO, Take by mouth daily., Disp: , Rfl:    rosuvastatin (CRESTOR) 20 MG tablet, Take 20 mg by mouth daily., Disp: , Rfl:    simvastatin  (ZOCOR ) 20 MG tablet, Take 20 mg by mouth at bedtime.  , Disp: , Rfl:    traMADol  (ULTRAM ) 50 MG tablet, Take 1-2 tablets (50-100 mg total) by mouth every 6 (six) hours as needed for moderate pain or severe pain., Disp: 40 tablet, Rfl: 0   Wheat Dextrin (BENEFIBER DRINK MIX) PACK, Take 3 g by mouth daily., Disp: , Rfl:   Past Medical History: Past Medical History:  Diagnosis Date   Anxiety    Depression    GERD (gastroesophageal reflux disease)    Hemorrhoids    HOH (hard of hearing)    BOTH EARS    Tobacco Use: Social History   Tobacco Use  Smoking Status Every Day   Current packs/day: 0.00   Average packs/day: 1 pack/day for 36.0 years (36.0 ttl pk-yrs)   Types: Cigarettes   Start date: 06/26/1979   Last attempt to quit: 06/26/2015   Years since quitting: 8.3  Smokeless Tobacco Never    Labs: Review Flowsheet       Latest Ref Rng & Units 12/16/2015  Labs for ITP Cardiac and Pulmonary Rehab  TCO2 0 - 100 mmol/L 21     Capillary Blood Glucose: Lab Results  Component Value Date   GLUCAP 117 (H) 12/10/2015     Exercise Target Goals: Exercise Program Goal: Individual exercise prescription set using results from initial 6 min walk test and THRR while considering  patient's activity barriers and safety.   Exercise Prescription Goal: Initial exercise prescription builds  to 30-45 minutes a day of aerobic activity, 2-3 days per week.  Home exercise guidelines will be given to patient during program as part of exercise prescription that the participant will acknowledge.  Activity Barriers & Risk Stratification:  Activity Barriers & Cardiac Risk Stratification - 08/23/23 1037       Activity Barriers & Cardiac Risk Stratification   Activity Barriers Back Problems;Neck/Spine Problems    Cardiac Risk Stratification High             6 Minute Walk:  6 Minute Walk     Row Name 08/23/23 0929 11/14/23 1652       6 Minute Walk   Phase Initial Discharge    Distance 1755 feet 2049 feet    Distance % Change -- 16.75 %     Distance Feet Change -- 294 ft    Walk Time 6 minutes 6 minutes    # of Rest Breaks 0 0    MPH 3.32 3.88    METS 4.2 4.91    RPE 8 8    Perceived Dyspnea  0 0    VO2 Peak 14.7 17.18    Symptoms No Yes (comment)    Comments -- chronic back pain 4/10. does not resolve with rest    Resting HR 77 bpm 56 bpm    Resting BP 112/70 104/72    Resting Oxygen Saturation  96 % --    Exercise Oxygen Saturation  during 6 min walk 97 % --    Max Ex. HR 95 bpm 102 bpm    Max Ex. BP 138/72 148/70    2 Minute Post BP 112/68 120/64             Oxygen Initial Assessment:   Oxygen Re-Evaluation:   Oxygen Discharge (Final Oxygen Re-Evaluation):   Initial Exercise Prescription:  Initial Exercise Prescription - 08/23/23 1000       Date of Initial Exercise RX and Referring Provider   Date 08/23/23    Referring Provider Dr. Jac Martes, MD covering)    Expected Discharge Date 11/16/23      Recumbant Bike   Level 2    RPM 60    Watts 25    Minutes 15    METs 4.2      Recumbant Elliptical   Level 2    RPM 60    Watts 50    Minutes 15    METs 4.2      Prescription Details   Frequency (times per week) 3    Duration Progress to 30 minutes of continuous aerobic without signs/symptoms of physical distress      Intensity   THRR 40-80% of Max Heartrate 64-127    Ratings of Perceived Exertion 11-13    Perceived Dyspnea 0-4      Progression   Progression Continue progressive overload as per policy without signs/symptoms or physical distress.      Resistance Training   Training Prescription Yes    Weight 4 lbs    Reps 10-15             Perform Capillary Blood Glucose checks as needed.  Exercise Prescription Changes:   Exercise Prescription Changes     Row Name 08/29/23 1624 09/12/23 1625 09/30/23 1606 10/14/23 1625 10/28/23 1640     Response to Exercise   Blood Pressure (Admit) 118/64 118/60 114/68 130/76 116/68   Blood Pressure (Exercise) 148/90  156/80 -- -- --   Blood Pressure (Exit) 100/68 104/68 100/60  98/64 106/72   Heart Rate (Admit) 74 bpm 70 bpm 88 bpm 70 bpm 64 bpm   Heart Rate (Exercise) 100 bpm 98 bpm 119 bpm 121 bpm 116 bpm   Heart Rate (Exit) 69 bpm 75 bpm 94 bpm 79 bpm 75 bpm   Rating of Perceived Exertion (Exercise) 11.5 10.5 12.5 11 10.5   Perceived Dyspnea (Exercise) 0 0 0 0 0   Symptoms 0 0 0 0 0   Comments Pt first day in the [facility] Reviewed MET's, goals and home Reviewed MET's Reviewed MET's and goals Reviewed MET's   Duration Progress to 30 minutes of  aerobic without signs/symptoms of physical distress Progress to 30 minutes of  aerobic without signs/symptoms of physical distress Progress to 30 minutes of  aerobic without signs/symptoms of physical distress Progress to 30 minutes of  aerobic without signs/symptoms of physical distress Progress to 30 minutes of  aerobic without signs/symptoms of physical distress   Intensity THRR unchanged THRR unchanged THRR unchanged THRR unchanged THRR unchanged     Progression   Progression Continue to progress workloads to maintain intensity without signs/symptoms of physical distress. Continue to progress workloads to maintain intensity without signs/symptoms of physical distress. Continue to progress workloads to maintain intensity without signs/symptoms of physical distress. Continue to progress workloads to maintain intensity without signs/symptoms of physical distress. Continue to progress workloads to maintain intensity without signs/symptoms of physical distress.   Average METs 3.3 4.35 5.05 4.85 5.85     Resistance Training   Training Prescription Yes Yes Yes Yes Yes   Weight 4 lbs 4 lbs 6 lbs wts 6 lbs wts 6 lbs wts   Reps 10-15 10-15 10-15 10-15 10-15   Time 10 Minutes 10 Minutes 10 Minutes 10 Minutes 10 Minutes     Recumbant Bike   Level 2 3 4 4  --   RPM 66 82 80 83 --   Watts 102 55 92 76 --   Minutes 15 15 15 15  --   METs 4 3.8 4.9 4.7 --      Recumbant Elliptical   Level 2 3 4 4 5    RPM 80 80 82 82 91   Watts 28 122 131 116 151   Minutes 15 15 15 15 15    METs 2.6 4.9 5.2 5 6.2     Rower   Level -- -- -- -- 2   Watts -- -- -- -- 53   Minutes -- -- -- -- 15   METs -- -- -- -- 5.49     Home Exercise Plan   Plans to continue exercise at -- Home (comment) Home (comment) Home (comment) Home (comment)   Frequency -- Add 2 additional days to program exercise sessions. Add 2 additional days to program exercise sessions. Add 2 additional days to program exercise sessions. Add 2 additional days to program exercise sessions.   Initial Home Exercises Provided -- 09/12/23 09/12/23 09/12/23 09/12/23            Exercise Comments:   Exercise Comments     Row Name 08/29/23 1634 09/12/23 1629 09/30/23 1608 10/14/23 1632 10/28/23 1645   Exercise Comments Pt first day in the [facility] program. Pt tolerated exercise well with an average MET level of 3.3. He is off to a good start and is learning his THRR, RPE and ExRx Reviewed MET's, goals and home ExRx. Pt tolerated exercise well with an average MET level of 4.35. He is already progressing MET's and WL's  and feels good with his exercie routine. He feels good about his goals and has been working on weight loss since the summer and is doing well, he states he's having a hard time with heart healthy eating, but is making changes. Reviewed MET's today. Pt tolerated exercise well with an average MET level of 5.05. Pt is increasing WL's and progressing MET's. He is doing well and feels good about his progress Reviewed MET's and goals. Pt tolerated exercise well with an average MET level of 4.85. He is doing well, some issues with quad muscle tighness and occasional pain, but still feels good with exercise routine. He's been working on more heart healthy choices and is working with the RD to make some changes. He also states he had better control of his SOB Reviewed MET's. Pt tolerated exercise well  with an average MET level of 5.85. Pt is doing well and added a new modality today with the rower. He did very well and is enjoying some change. He is increasing MET's and is gaining more confidence in exercise.            Exercise Goals and Review:   Exercise Goals     Row Name 08/23/23 1038             Exercise Goals   Increase Physical Activity Yes       Intervention Provide advice, education, support and counseling about physical activity/exercise needs.;Develop an individualized exercise prescription for aerobic and resistive training based on initial evaluation findings, risk stratification, comorbidities and participant's personal goals.       Expected Outcomes Short Term: Attend rehab on a regular basis to increase amount of physical activity.;Long Term: Exercising regularly at least 3-5 days a week.;Long Term: Add in home exercise to make exercise part of routine and to increase amount of physical activity.       Increase Strength and Stamina Yes       Intervention Provide advice, education, support and counseling about physical activity/exercise needs.;Develop an individualized exercise prescription for aerobic and resistive training based on initial evaluation findings, risk stratification, comorbidities and participant's personal goals.       Expected Outcomes Short Term: Increase workloads from initial exercise prescription for resistance, speed, and METs.;Short Term: Perform resistance training exercises routinely during rehab and add in resistance training at home;Long Term: Improve cardiorespiratory fitness, muscular endurance and strength as measured by increased METs and functional capacity ( )       Able to understand and use rate of perceived exertion (RPE) scale Yes       Intervention Provide education and explanation on how to use RPE scale       Expected Outcomes Short Term: Able to use RPE daily in rehab to express subjective intensity level;Long Term:  Able to  use RPE to guide intensity level when exercising independently       Knowledge and understanding of Target Heart Rate Range (THRR) Yes       Intervention Provide education and explanation of THRR including how the numbers were predicted and where they are located for reference       Expected Outcomes Short Term: Able to state/look up THRR;Long Term: Able to use THRR to govern intensity when exercising independently;Short Term: Able to use daily as guideline for intensity in rehab       Understanding of Exercise Prescription Yes       Intervention Provide education, explanation, and written materials on patient's individual exercise prescription  Expected Outcomes Short Term: Able to explain program exercise prescription;Long Term: Able to explain home exercise prescription to exercise independently                Exercise Goals Re-Evaluation :  Exercise Goals Re-Evaluation     Row Name 08/29/23 1630 09/12/23 1627 10/14/23 1628         Exercise Goal Re-Evaluation   Exercise Goals Review Increase Physical Activity;Understanding of Exercise Prescription;Increase Strength and Stamina;Knowledge and understanding of Target Heart Rate Range (THRR);Able to understand and use rate of perceived exertion (RPE) scale Increase Physical Activity;Understanding of Exercise Prescription;Increase Strength and Stamina;Knowledge and understanding of Target Heart Rate Range (THRR);Able to understand and use rate of perceived exertion (RPE) scale Increase Physical Activity;Understanding of Exercise Prescription;Increase Strength and Stamina;Knowledge and understanding of Target Heart Rate Range (THRR);Able to understand and use rate of perceived exertion (RPE) scale     Comments Pt first day in the [facility] program. Pt tolerated exercise well with an average MET level of 3.3. He is off to a good start and is learning his THRR, RPE and ExRx Reviewed MET's, goals and home ExRx. Pt tolerated exercise well  with an average MET level of 4.35. He is already progressing MET's and WL's and feels good with his exercie routine. He feels good about his goals and has been working on weight loss since the summer and is doing well, he states he's having a hard time with heart healthy eating, but is making changes. Reviewed MET's and goals. Pt tolerated exercise well with an average MET level of 4.85. He is doing well, some issues with quad muscle tighness and occasional pain, but still feels good with exercise routine. He's been working on more heart healthy choices and is working with the RD to make some changes. He also states he had better control of his SOB     Expected Outcomes Will continue to monitor pt and progress workloads as tolerated without sign or symptom Will continue to monitor pt and progress workloads as tolerated without sign or symptom Will continue to monitor pt and progress workloads as tolerated without sign or symptom              Discharge Exercise Prescription (Final Exercise Prescription Changes):  Exercise Prescription Changes - 10/28/23 1640       Response to Exercise   Blood Pressure (Admit) 116/68    Blood Pressure (Exit) 106/72    Heart Rate (Admit) 64 bpm    Heart Rate (Exercise) 116 bpm    Heart Rate (Exit) 75 bpm    Rating of Perceived Exertion (Exercise) 10.5    Perceived Dyspnea (Exercise) 0    Symptoms 0    Comments Reviewed MET's    Duration Progress to 30 minutes of  aerobic without signs/symptoms of physical distress    Intensity THRR unchanged      Progression   Progression Continue to progress workloads to maintain intensity without signs/symptoms of physical distress.    Average METs 5.85      Resistance Training   Training Prescription Yes    Weight 6 lbs wts    Reps 10-15    Time 10 Minutes      Recumbant Elliptical   Level 5    RPM 91    Watts 151    Minutes 15    METs 6.2      Rower   Level 2    Watts 53    Minutes 15  METs 5.49       Home Exercise Plan   Plans to continue exercise at Home (comment)    Frequency Add 2 additional days to program exercise sessions.    Initial Home Exercises Provided 09/12/23             Nutrition:  Target Goals: Understanding of nutrition guidelines, daily intake of sodium 1500mg , cholesterol 200mg , calories 30% from fat and 7% or less from saturated fats, daily to have 5 or more servings of fruits and vegetables.  Biometrics:  Pre Biometrics - 08/23/23 0822       Pre Biometrics   Waist Circumference 42.5 inches    Hip Circumference 42.5 inches    Waist to Hip Ratio 1 %    Triceps Skinfold 13 mm    % Body Fat 27.9 %    Grip Strength 44 kg    Flexibility 12 in    Single Leg Stand 30 seconds             Post Biometrics - 11/14/23 1653        Post  Biometrics   Height 6' (1.829 m)    Weight 92.8 kg    Waist Circumference 39.25 inches    Hip Circumference 40 inches    Waist to Hip Ratio 0.98 %    BMI (Calculated) 27.74    Triceps Skinfold 13 mm    % Body Fat 26 %    Grip Strength 45 kg    Flexibility 14 in    Single Leg Stand 30 seconds             Nutrition Therapy Plan and Nutrition Goals:  Nutrition Therapy & Goals - 10/27/23 1143       Nutrition Therapy   Diet Heart Healthy Diet    Drug/Food Interactions Statins/Certain Fruits      Personal Nutrition Goals   Nutrition Goal Patient to identify strategies for reducing cardiovascular risk by attending the [facility] education and nutrition series weekly.   goal in progress.   Personal Goal #2 Patient to improve diet quality by using the plate method as a guide for meal planning to include lean protein/plant protein, fruits, vegetables, whole grains, nonfat dairy as part of a well-balanced diet.   goal in progress.   Personal Goal #3 Patient to identify strategies for weight loss of 0.5-2.0# per week.   goal in progress.   Comments Goals in progress. Abbott Abbot has medical history of STEMI. He continues  to attend the [facility] education and nutrition series. Lipids/ LDL are not at goal; he continues crestor and diet modification. He is down 6# since starting with our program. He has quit smoking. He has drastically reduced eating out/fast food and is cooking at home regularly. He has increased dietary fiber, decreased saturated fat, and decreased sodium. Patient will continue to benefit from participation in intensive cardiac rehab for nutrition, exercise, and lifestyle modification.      Intervention Plan   Intervention Prescribe, educate and counsel regarding individualized specific dietary modifications aiming towards targeted core components such as weight, hypertension, lipid management, diabetes, heart failure and other comorbidities.;Nutrition handout(s) given to patient.    Expected Outcomes Short Term Goal: Understand basic principles of dietary content, such as calories, fat, sodium, cholesterol and nutrients.;Long Term Goal: Adherence to prescribed nutrition plan.             Nutrition Assessments:  Nutrition Assessments - 09/08/23 0909       Rate Your Plate Scores  Pre Score 53            MEDIFICTS Score Key: >=70 Need to make dietary changes  40-70 Heart Healthy Diet <= 40 Therapeutic Level Cholesterol Diet   Flowsheet Row INTENSIVE CARDIAC REHAB from 09/07/2023 in [facility]  Picture Your Plate Total Score on Admission 53      Picture Your Plate Scores: <82 Unhealthy dietary pattern with much room for improvement. 41-50 Dietary pattern unlikely to meet recommendations for good health and room for improvement. 51-60 More healthful dietary pattern, with some room for improvement.  >60 Healthy dietary pattern, although there may be some specific behaviors that could be improved.    Nutrition Goals Re-Evaluation:  Nutrition Goals Re-Evaluation     Row Name 09/27/23 1419 10/27/23 1143           Goals    Current Weight 206 lb 2.1 oz (93.5 kg) 205 lb 4 oz (93.1 kg)      Comment cholesterol 251, triglycerides 249, LDL 160 no new labs at this time; most recent labs from January 2025  cholesterol 251, triglycerides 249, LDL 160      Expected Outcome Goals in progress. Abbott Abbot has medical history of STEMI. He continues to attend the [facility] education and nutrition series. Lipids/ LDL are not at goal; he continues crestor and diet modification. He is down 5.1# since starting with our program. He has been working toward smoking cessation. Patient will benefit from participation in intensive cardiac rehab for nutrition, exercise, and lifestyle modification. Goals in progress. Abbott Abbot has medical history of STEMI. He continues to attend the [facility] education and nutrition series. Lipids/ LDL are not at goal; he continues crestor and diet modification. He is down 6# since starting with our program. He has quit smoking. He has drastically reduced eating out/fast food and is cooking at home regularly. He has increased dietary fiber, decreased saturated fat, and decreased sodium. Patient will continue to benefit from participation in intensive cardiac rehab for nutrition, exercise, and lifestyle modification.               Nutrition Goals Re-Evaluation:  Nutrition Goals Re-Evaluation     Row Name 09/27/23 1419 10/27/23 1143           Goals   Current Weight 206 lb 2.1 oz (93.5 kg) 205 lb 4 oz (93.1 kg)      Comment cholesterol 251, triglycerides 249, LDL 160 no new labs at this time; most recent labs from January 2025  cholesterol 251, triglycerides 249, LDL 160      Expected Outcome Goals in progress. Abbott Abbot has medical history of STEMI. He continues to attend the [facility] education and nutrition series. Lipids/ LDL are not at goal; he continues crestor and diet modification. He is down 5.1# since starting with our program. He has been working toward smoking cessation. Patient will benefit from participation  in intensive cardiac rehab for nutrition, exercise, and lifestyle modification. Goals in progress. Abbott Abbot has medical history of STEMI. He continues to attend the [facility] education and nutrition series. Lipids/ LDL are not at goal; he continues crestor and diet modification. He is down 6# since starting with our program. He has quit smoking. He has drastically reduced eating out/fast food and is cooking at home regularly. He has increased dietary fiber, decreased saturated fat, and decreased sodium. Patient will continue to benefit from participation in intensive cardiac rehab for nutrition, exercise, and lifestyle modification.  Nutrition Goals Discharge (Final Nutrition Goals Re-Evaluation):  Nutrition Goals Re-Evaluation - 10/27/23 1143       Goals   Current Weight 205 lb 4 oz (93.1 kg)    Comment no new labs at this time; most recent labs from January 2025  cholesterol 251, triglycerides 249, LDL 160    Expected Outcome Goals in progress. Abbott Abbot has medical history of STEMI. He continues to attend the [facility] education and nutrition series. Lipids/ LDL are not at goal; he continues crestor and diet modification. He is down 6# since starting with our program. He has quit smoking. He has drastically reduced eating out/fast food and is cooking at home regularly. He has increased dietary fiber, decreased saturated fat, and decreased sodium. Patient will continue to benefit from participation in intensive cardiac rehab for nutrition, exercise, and lifestyle modification.             Psychosocial: Target Goals: Acknowledge presence or absence of significant depression and/or stress, maximize coping skills, provide positive support system. Participant is able to verbalize types and ability to use techniques and skills needed for reducing stress and depression.  Initial Review & Psychosocial Screening:  Initial Psych Review & Screening - 08/23/23 1013       Initial Review    Current issues with History of Depression;Current Sleep Concerns   Pt on Lexapro, feels it working. Voices that he does not feel depressed.     Family Dynamics   Good Support System? Yes   Pt has his brother and his good friends Twilla Galea and Rob     Barriers   Psychosocial barriers to participate in program The patient should benefit from training in stress management and relaxation.      Screening Interventions   Interventions Encouraged to exercise             Quality of Life Scores:  Quality of Life - 08/23/23 1033       Quality of Life   Select Quality of Life      Quality of Life Scores   Health/Function Pre 13.8 %    Socioeconomic Pre 23 %    Psych/Spiritual Pre 14.79 %    Family Pre 28 %    GLOBAL Pre 17.36 %            Scores of 19 and below usually indicate a poorer quality of life in these areas.  A difference of  2-3 points is a clinically meaningful difference.  A difference of 2-3 points in the total score of the Quality of Life Index has been associated with significant improvement in overall quality of life, self-image, physical symptoms, and general health in studies assessing change in quality of life.  PHQ-9: Review Flowsheet       08/23/2023  Depression screen PHQ 2/9  Decreased Interest 2  Down, Depressed, Hopeless 0  PHQ - 2 Score 2  Altered sleeping 2  Tired, decreased energy 1  Change in appetite 0  Feeling bad or failure about yourself  0  Trouble concentrating 0  Moving slowly or fidgety/restless 0  Suicidal thoughts 0  PHQ-9 Score 5  Difficult doing work/chores Not difficult at all   Interpretation of Total Score  Total Score Depression Severity:  1-4 = Minimal depression, 5-9 = Mild depression, 10-14 = Moderate depression, 15-19 = Moderately severe depression, 20-27 = Severe depression   Psychosocial Evaluation and Intervention:   Psychosocial Re-Evaluation:  Psychosocial Re-Evaluation     Row Name 09/27/23 1339 10/21/23  1253  11/15/23 1624         Psychosocial Re-Evaluation   Current issues with History of Depression;Current Sleep Concerns History of Depression;Current Sleep Concerns History of Depression;Current Sleep Concerns     Comments Will review PHq2-9 and quality of life questionnaire in the upcoming week Quality of life questionnaire reviewed. Abbott Abbot denies being depressed currently. Abbott Abbot says the antidepressant he is taking is controlling his depression.Abbott Abbot has good support from his brother and a good friend who lives in the area. 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 Abbott Abbot says he has not smoked a cigarette in a while. Abbott Abbot says he is chewing a lot of nicotine  gum. Abbott Abbot says that he is not as short of breath. Abbott Abbot uses his trelegy inhaler as needed. Abbott Abbot also reports that his energy level has increased since he has been participating in cardiac rehab Abbott Abbot has not voiced any increased concerns or stressors during exercise at cardiac rehab     Expected Outcomes Abbott Abbot will have controlled or decreased depression upon completion of cardiac rehab. Abbott Abbot will have controlled or decreased depression upon completion of cardiac rehab. Abbott Abbot will have controlled or decreased depression upon completion of cardiac rehab.     Interventions Stress management education;Relaxation education;Encouraged to attend Cardiac Rehabilitation for the exercise Stress management education;Relaxation education;Encouraged to attend Cardiac Rehabilitation for the exercise Stress management education;Relaxation education;Encouraged to attend Cardiac Rehabilitation for the exercise     Continue Psychosocial Services  Follow up required by staff Follow up required by staff Follow up required by staff              Psychosocial Discharge (Final Psychosocial Re-Evaluation):  Psychosocial Re-Evaluation - 11/15/23 1624       Psychosocial Re-Evaluation   Current issues with History of  Depression;Current Sleep Concerns    Comments Abbott Abbot has not voiced any increased concerns or stressors during exercise at cardiac rehab    Expected Outcomes Abbott Abbot will have controlled or decreased depression upon completion of cardiac rehab.    Interventions Stress management education;Relaxation education;Encouraged to attend Cardiac Rehabilitation for the exercise    Continue Psychosocial Services  Follow up required by staff             Vocational Rehabilitation: Provide vocational rehab assistance to qualifying candidates.   Vocational Rehab Evaluation & Intervention:  Vocational Rehab - 08/23/23 1015       Initial Vocational Rehab Evaluation & Intervention   Assessment shows need for Vocational Rehabilitation No   Pt is employed            Education: Education Goals: Education classes will be provided on a weekly basis, covering required topics. Participant will state understanding/return demonstration of topics presented.    Education     Row Name 08/29/23 1600     Education   Cardiac Education Topics Pritikin   Geographical information systems officer Psychosocial   Psychosocial Workshop From Head to Heart: The Power of a Healthy Outlook   Instruction Review Code 1- Verbalizes Understanding   Class Start Time 1400   Class Stop Time 1446   Class Time Calculation (min) 46 min    Row Name 08/31/23 1400     Education   Cardiac Education Topics Pritikin   Secondary school teacher School   Educator Nurse;Respiratory Therapist   Weekly Topic Powerhouse Plant-Based Proteins  Instruction Review Code 1- Verbalizes Understanding   Class Start Time 1358   Class Stop Time 1430   Class Time Calculation (min) 32 min    Row Name 09/02/23 1300     Education   Cardiac Education Topics Pritikin   Hospital doctor Education   General Education Heart  Disease Risk Reduction   Instruction Review Code 1- Verbalizes Understanding   Class Start Time 1350   Class Stop Time 1430   Class Time Calculation (min) 40 min    Row Name 09/05/23 1400     Education   Cardiac Education Topics Pritikin   [employer] Workshops     Workshops   Educator Exercise Physiologist   Select Exercise   Exercise Workshop Location manager and Fall Prevention   Instruction Review Code 1- Verbalizes Understanding   Class Start Time 1400   Class Stop Time 1445   Class Time Calculation (min) 45 min    Row Name 09/07/23 1500     Education   Cardiac Education Topics Pritikin   Customer service manager   Weekly Topic Adding Flavor - Sodium-Free   Instruction Review Code 1- Verbalizes Understanding   Class Start Time 1358   Class Stop Time 1436   Class Time Calculation (min) 38 min    Row Name 09/09/23 1500     Education   Cardiac Education Topics Pritikin   Licensed conveyancer Nutrition   Nutrition Overview of the Pritikin Eating Plan   Instruction Review Code 1- Verbalizes Understanding   Class Start Time 1357   Class Stop Time 1444   Class Time Calculation (min) 47 min    Row Name 09/12/23 1400     Education   Cardiac Education Topics Pritikin   Nurse, children's Exercise Physiologist   Select Psychosocial   Psychosocial Healthy Minds, Bodies, Hearts   Instruction Review Code 1- Verbalizes Understanding   Class Start Time 1409   Class Stop Time 1443   Class Time Calculation (min) 34 min    Row Name 09/19/23 1400     Education   Cardiac Education Topics Pritikin   Select Core Videos     Core Videos   Educator Nurse   Select Nutrition   Nutrition Becoming a Pritikin Chef   Instruction Review Code 1- Verbalizes Understanding   Class Start Time 1401   Class Stop Time 1436   Class Time Calculation (min) 35 min    Row Name  09/23/23 1500     Education   Cardiac Education Topics Pritikin   Glass blower/designer Nutrition   Nutrition Workshop Label Reading   Instruction Review Code 1- Tax inspector   Class Start Time 1400   Class Stop Time 1440   Class Time Calculation (min) 40 min    Row Name 09/26/23 1400     Education   Cardiac Education Topics Pritikin   [employer]     Workshops   Educator Exercise Physiologist   Select Psychosocial   Psychosocial Workshop Recognizing and Reducing Stress   Instruction Review Code 1- Verbalizes Understanding   Class Start Time 1358   Class Stop Time 1445   Class  Time Calculation (min) 47 min    Row Name 09/28/23 1500     Education   Cardiac Education Topics Pritikin   Medical laboratory scientific officer   Weekly Topic [employer] Desserts   Instruction Review Code 1- Verbalizes Understanding   Class Start Time 1355   Class Stop Time 1433   Class Time Calculation (min) 38 min    Row Name 09/30/23 1500     Education   Cardiac Education Topics Pritikin   Licensed conveyancer Nutrition   Nutrition Calorie Density   Instruction Review Code 1- Verbalizes Understanding   Class Start Time 1400   Class Stop Time 1435   Class Time Calculation (min) 35 min    Row Name 10/03/23 1500     Education   Cardiac Education Topics Pritikin   Banker Exercise Education   Exercise Education Exercise Action Plan   Instruction Review Code 1- Verbalizes Understanding   Class Start Time 1405   Class Stop Time 1455   Class Time Calculation (min) 50 min    Row Name 10/05/23 1400     Education   Cardiac Education Topics Pritikin   Customer service manager   Weekly Topic Efficiency Cooking - Meals in a Snap   Instruction Review  Code 1- Verbalizes Understanding   Class Start Time 1345   Class Stop Time 1435   Class Time Calculation (min) 50 min    Row Name 10/10/23 1500     Education   Cardiac Education Topics Pritikin   Glass blower/designer Nutrition   Nutrition Workshop Targeting Your Nutrition Priorities   Instruction Review Code 1- Verbalizes Understanding   Class Start Time 1400   Class Stop Time 1440   Class Time Calculation (min) 40 min    Row Name 10/12/23 1500     Education   Cardiac Education Topics Pritikin   Customer service manager   Weekly Topic One-Pot Wonders   Instruction Review Code 1- Verbalizes Understanding   Class Start Time 1400   Class Stop Time 1445   Class Time Calculation (min) 45 min    Row Name 10/14/23 1400     Education   Cardiac Education Topics Pritikin   Hospital doctor Education   General Education Hypertension and Heart Disease   Instruction Review Code 1- Verbalizes Understanding   Class Start Time 1405   Class Stop Time 1455   Class Time Calculation (min) 50 min    Row Name 10/17/23 1400     Education   Cardiac Education Topics Pritikin   [employer] Works[employer]     Workshops   Educator Exercise Physiologist   Select Psychosocial   Psychosocial Workshop Focused Goals, Sustainable Changes   Instruction Review Code 1- Verbalizes Understanding   Class Start Time 1345   Class Stop Time 1443   Class Time Calculation (min) 58 min    Row Name 10/19/23 1600     Education   Cardiac Education Topics Pritikin   [employer]  School   Scientist, research (physical sciences)   Weekly Topic Comforting Weekend Breakfasts   Instruction Review Code 1- Verbalizes Understanding   Class Start Time 1400   Class Stop Time 1440   Class Time Calculation (min) 40 min    Row Name 10/24/23 1400     Education    Cardiac Education Topics Pritikin   Psychologist, forensic Exercise Education   Exercise Education Biomechanial Limitations   Instruction Review Code 1- Verbalizes Understanding   Class Start Time 1400   Class Stop Time 1440   Class Time Calculation (min) 40 min    Row Name 10/26/23 1400     Education   Cardiac Education Topics Pritikin   Customer service manager   Weekly Topic Fast Evening Meals   Instruction Review Code 1- Verbalizes Understanding   Class Start Time 1400   Class Stop Time 1440   Class Time Calculation (min) 40 min    Row Name 10/28/23 1400     Education   Cardiac Education Topics Pritikin   Nurse, children's   Educator Dietitian   Nutrition Vitamins and Minerals   Instruction Review Code 1- Verbalizes Understanding   Class Start Time 1400   Class Stop Time 1440   Class Time Calculation (min) 40 min    Row Name 10/31/23 1500     Education   Cardiac Education Topics Pritikin   Glass blower/designer Nutrition   Nutrition Workshop Fueling a Forensic psychologist   Instruction Review Code 1- Teaching laboratory technician Start Time 1400   Class Stop Time 1440   Class Time Calculation (min) 40 min    Row Name 11/02/23 1500     Education   Cardiac Education Topics Pritikin   Customer service manager   Weekly Topic International Cuisine- Spotlight on the [employer] Zones   Instruction Review Code 1- Verbalizes Understanding   Class Start Time 1400   Class Stop Time 1440   Class Time Calculation (min) 40 min    Row Name 11/04/23 1400     Education   Cardiac Education Topics Pritikin   Psychologist, forensic Exercise Education   Exercise Education Improving Performance   Instruction Review Code 1- Verbalizes  Understanding   Class Start Time 1400   Class Stop Time 1435   Class Time Calculation (min) 35 min    Row Name 11/07/23 1700     Education   Cardiac Education Topics Pritikin   [employer]     Workshops   Educator Exercise Physiologist   [facility] Psychosocial   Psychosocial Workshop Healthy Sleep for a Healthy Heart   Instruction Review Code 1- Verbalizes Understanding   Class Start Time 1403   Class Stop Time 1457   Class Time Calculation (min) 54 min    Row Name 11/09/23 1400     Education   Cardiac Education Topics Pritikin   Customer service manager   Weekly Topic Simple Sides and Sauces   Instruction Review Code 1- Verbalizes Understanding   Class Start Time 1355   Class Stop Time  1431   Class Time Calculation (min) 36 min    Row Name 11/14/23 1500     Education   Cardiac Education Topics Pritikin   [employer] Workshops     Workshops   Educator Exercise Physiologist   Select Exercise   Exercise Workshop Managing Heart Disease: Your Path to a Healthier Heart   Instruction Review Code 1- Verbalizes Understanding   Class Start Time 1400   Class Stop Time 1448   Class Time Calculation (min) 48 min            Core Videos: Exercise    Move It!  Clinical staff conducted group or individual video education with verbal and written material and guidebook.  Patient learns the recommended Pritikin exercise program. Exercise with the goal of living a long, healthy life. Some of the health benefits of exercise include controlled diabetes, healthier blood pressure levels, improved cholesterol levels, improved heart and lung capacity, improved sleep, and better body composition. Everyone should speak with their doctor before starting or changing an exercise routine.  Biomechanical Limitations Clinical staff conducted group or individual video education with verbal and written material and guidebook.  Patient learns how  biomechanical limitations can impact exercise and how we can mitigate and possibly overcome limitations to have an impactful and balanced exercise routine.  Body Composition Clinical staff conducted group or individual video education with verbal and written material and guidebook.  Patient learns that body composition (ratio of muscle mass to fat mass) is a key component to assessing overall fitness, rather than body weight alone. Increased fat mass, especially visceral belly fat, can put us  at increased risk for metabolic syndrome, type 2 diabetes, heart disease, and even death. It is recommended to combine diet and exercise (cardiovascular and resistance training) to improve your body composition. Seek guidance from your physician and exercise physiologist before implementing an exercise routine.  Exercise Action Plan Clinical staff conducted group or individual video education with verbal and written material and guidebook.  Patient learns the recommended strategies to achieve and enjoy long-term exercise adherence, including variety, self-motivation, self-efficacy, and positive decision making. Benefits of exercise include fitness, good health, weight management, more energy, better sleep, less stress, and overall well-being.  Medical   Heart Disease Risk Reduction Clinical staff conducted group or individual video education with verbal and written material and guidebook.  Patient learns our heart is our most vital organ as it circulates oxygen, nutrients, white blood cells, and hormones throughout the entire body, and carries waste away. Data supports a plant-based eating plan like the Pritikin Program for its effectiveness in slowing progression of and reversing heart disease. The video provides a number of recommendations to address heart disease.   Metabolic Syndrome and Belly Fat  Clinical staff conducted group or individual video education with verbal and written material and guidebook.   Patient learns what metabolic syndrome is, how it leads to heart disease, and how one can reverse it and keep it from coming back. You have metabolic syndrome if you have 3 of the following 5 criteria: abdominal obesity, high blood pressure, high triglycerides, low HDL cholesterol, and high blood sugar.  Hypertension and Heart Disease Clinical staff conducted group or individual video education with verbal and written material and guidebook.  Patient learns that high blood pressure, or hypertension, is very common in the [place] . Hypertension is largely due to excessive salt intake, but other important risk factors include being overweight, physical inactivity, drinking too much alcohol, smoking, and  not eating enough potassium from fruits and vegetables. High blood pressure is a leading risk factor for heart attack, stroke, congestive heart failure, dementia, kidney failure, and premature death. Long-term effects of excessive salt intake include stiffening of the arteries and thickening of heart muscle and organ damage. Recommendations include ways to reduce hypertension and the risk of heart disease.  Diseases of Our Time - Focusing on Diabetes Clinical staff conducted group or individual video education with verbal and written material and guidebook.  Patient learns why the best way to stop diseases of our time is prevention, through food and other lifestyle changes. Medicine (such as prescription pills and surgeries) is often only a Band-Aid on the problem, not a long-term solution. Most common diseases of our time include obesity, type 2 diabetes, hypertension, heart disease, and cancer. The Pritikin Program is recommended and has been proven to help reduce, reverse, and/or prevent the damaging effects of metabolic syndrome.  Nutrition   Overview of the Pritikin Eating Plan  Clinical staff conducted group or individual video education with verbal and written material and guidebook.  Patient  learns about the Pritikin Eating Plan for disease risk reduction. The Pritikin Eating Plan emphasizes a wide variety of unrefined, minimally-processed carbohydrates, like fruits, vegetables, whole grains, and legumes. Go, Caution, and Stop food choices are explained. Plant-based and lean animal proteins are emphasized. Rationale provided for low sodium intake for blood pressure control, low added sugars for blood sugar stabilization, and low added fats and oils for coronary artery disease risk reduction and weight management.  Calorie Density  Clinical staff conducted group or individual video education with verbal and written material and guidebook.  Patient learns about calorie density and how it impacts the Pritikin Eating Plan. Knowing the characteristics of the food you choose will help you decide whether those foods will lead to weight gain or weight loss, and whether you want to consume more or less of them. Weight loss is usually a side effect of the Pritikin Eating Plan because of its focus on low calorie-dense foods.  Label Reading  Clinical staff conducted group or individual video education with verbal and written material and guidebook.  Patient learns about the Pritikin recommended label reading guidelines and corresponding recommendations regarding calorie density, added sugars, sodium content, and whole grains.  Dining Out - Part 1  Clinical staff conducted group or individual video education with verbal and written material and guidebook.  Patient learns that restaurant meals can be sabotaging because they can be so high in calories, fat, sodium, and/or sugar. Patient learns recommended strategies on how to positively address this and avoid unhealthy pitfalls.  Facts on Fats  Clinical staff conducted group or individual video education with verbal and written material and guidebook.  Patient learns that lifestyle modifications can be just as effective, if not more so, as many  medications for lowering your risk of heart disease. A Pritikin lifestyle can help to reduce your risk of inflammation and atherosclerosis (cholesterol build-up, or plaque, in the artery walls). Lifestyle interventions such as dietary choices and physical activity address the cause of atherosclerosis. A review of the types of fats and their impact on blood cholesterol levels, along with dietary recommendations to reduce fat intake is also included.  Nutrition Action Plan  Clinical staff conducted group or individual video education with verbal and written material and guidebook.  Patient learns how to incorporate Pritikin recommendations into their lifestyle. Recommendations include planning and keeping personal health goals in mind as  an important part of their success.  Healthy Mind-Set    Healthy Minds, Bodies, Hearts  Clinical staff conducted group or individual video education with verbal and written material and guidebook.  Patient learns how to identify when they are stressed. Video will discuss the impact of that stress, as well as the many benefits of stress management. Patient will also be introduced to stress management techniques. The way we think, act, and feel has an impact on our hearts.  How Our Thoughts Can Heal Our Hearts  Clinical staff conducted group or individual video education with verbal and written material and guidebook.  Patient learns that negative thoughts can cause depression and anxiety. This can result in negative lifestyle behavior and serious health problems. Cognitive behavioral therapy is an effective method to help control our thoughts in order to change and improve our emotional outlook.  Additional Videos:  Exercise    Improving Performance  Clinical staff conducted group or individual video education with verbal and written material and guidebook.  Patient learns to use a non-linear approach by alternating intensity levels and lengths of time spent  exercising to help burn more calories and lose more body fat. Cardiovascular exercise helps improve heart health, metabolism, hormonal balance, blood sugar control, and recovery from fatigue. Resistance training improves strength, endurance, balance, coordination, reaction time, metabolism, and muscle mass. Flexibility exercise improves circulation, posture, and balance. Seek guidance from your physician and exercise physiologist before implementing an exercise routine and learn your capabilities and proper form for all exercise.  Introduction to Yoga  Clinical staff conducted group or individual video education with verbal and written material and guidebook.  Patient learns about yoga, a discipline of the coming together of mind, breath, and body. The benefits of yoga include improved flexibility, improved range of motion, better posture and core strength, increased lung function, weight loss, and positive self-image. Yoga's heart health benefits include lowered blood pressure, healthier heart rate, decreased cholesterol and triglyceride levels, improved immune function, and reduced stress. Seek guidance from your physician and exercise physiologist before implementing an exercise routine and learn your capabilities and proper form for all exercise.  Medical   Aging: Enhancing Your Quality of Life  Clinical staff conducted group or individual video education with verbal and written material and guidebook.  Patient learns key strategies and recommendations to stay in good physical health and enhance quality of life, such as prevention strategies, having an advocate, securing a Health Care Proxy and Power of Attorney, and keeping a list of medications and system for tracking them. It also discusses how to avoid risk for bone loss.  Biology of Weight Control  Clinical staff conducted group or individual video education with verbal and written material and guidebook.  Patient learns that weight gain occurs  because we consume more calories than we burn (eating more, moving less). Even if your body weight is normal, you may have higher ratios of fat compared to muscle mass. Too much body fat puts you at increased risk for cardiovascular disease, heart attack, stroke, type 2 diabetes, and obesity-related cancers. In addition to exercise, following the Pritikin Eating Plan can help reduce your risk.  Decoding Lab Results  Clinical staff conducted group or individual video education with verbal and written material and guidebook.  Patient learns that lab test reflects one measurement whose values change over time and are influenced by many factors, including medication, stress, sleep, exercise, food, hydration, pre-existing medical conditions, and more. It is recommended to use the  knowledge from this video to become more involved with your lab results and evaluate your numbers to speak with your doctor.   Diseases of Our Time - Overview  Clinical staff conducted group or individual video education with verbal and written material and guidebook.  Patient learns that according to the CDC, 50% to 70% of chronic diseases (such as obesity, type 2 diabetes, elevated lipids, hypertension, and heart disease) are avoidable through lifestyle improvements including healthier food choices, listening to satiety cues, and increased physical activity.  Sleep Disorders Clinical staff conducted group or individual video education with verbal and written material and guidebook.  Patient learns how good quality and duration of sleep are important to overall health and well-being. Patient also learns about sleep disorders and how they impact health along with recommendations to address them, including discussing with a physician.  Nutrition  Dining Out - Part 2 Clinical staff conducted group or individual video education with verbal and written material and guidebook.  Patient learns how to plan ahead and communicate in  order to maximize their dining experience in a healthy and nutritious manner. Included are recommended food choices based on the type of restaurant the patient is visiting.   Fueling a Banker conducted group or individual video education with verbal and written material and guidebook.  There is a strong connection between our food choices and our health. Diseases like obesity and type 2 diabetes are very prevalent and are in large-part due to lifestyle choices. The Pritikin Eating Plan provides plenty of food and hunger-curbing satisfaction. It is easy to follow, affordable, and helps reduce health risks.  Menu Workshop  Clinical staff conducted group or individual video education with verbal and written material and guidebook.  Patient learns that restaurant meals can sabotage health goals because they are often packed with calories, fat, sodium, and sugar. Recommendations include strategies to plan ahead and to communicate with the manager, chef, or server to help order a healthier meal.  Planning Your Eating Strategy  Clinical staff conducted group or individual video education with verbal and written material and guidebook.  Patient learns about the Pritikin Eating Plan and its benefit of reducing the risk of disease. The Pritikin Eating Plan does not focus on calories. Instead, it emphasizes high-quality, nutrient-rich foods. By knowing the characteristics of the foods, we choose, we can determine their calorie density and make informed decisions.  Targeting Your Nutrition Priorities  Clinical staff conducted group or individual video education with verbal and written material and guidebook.  Patient learns that lifestyle habits have a tremendous impact on disease risk and progression. This video provides eating and physical activity recommendations based on your personal health goals, such as reducing LDL cholesterol, losing weight, preventing or controlling type 2  diabetes, and reducing high blood pressure.  Vitamins and Minerals  Clinical staff conducted group or individual video education with verbal and written material and guidebook.  Patient learns different ways to obtain key vitamins and minerals, including through a recommended healthy diet. It is important to discuss all supplements you take with your doctor.   Healthy Mind-Set    Smoking Cessation  Clinical staff conducted group or individual video education with verbal and written material and guidebook.  Patient learns that cigarette smoking and tobacco addiction pose a serious health risk which affects millions of people. Stopping smoking will significantly reduce the risk of heart disease, lung disease, and many forms of cancer. Recommended strategies for quitting are covered, including  working with your doctor to develop a successful plan.  Culinary   Becoming a Set designer conducted group or individual video education with verbal and written material and guidebook.  Patient learns that cooking at home can be healthy, cost-effective, quick, and puts them in control. Keys to cooking healthy recipes will include looking at your recipe, assessing your equipment needs, planning ahead, making it simple, choosing cost-effective seasonal ingredients, and limiting the use of added fats, salts, and sugars.  Cooking - Breakfast and Snacks  Clinical staff conducted group or individual video education with verbal and written material and guidebook.  Patient learns how important breakfast is to satiety and nutrition through the entire day. Recommendations include key foods to eat during breakfast to help stabilize blood sugar levels and to prevent overeating at meals later in the day. Planning ahead is also a key component.  Cooking - Educational psychologist conducted group or individual video education with verbal and written material and guidebook.  Patient learns eating  strategies to improve overall health, including an approach to cook more at home. Recommendations include thinking of animal protein as a side on your plate rather than center stage and focusing instead on lower calorie dense options like vegetables, fruits, whole grains, and plant-based proteins, such as beans. Making sauces in large quantities to freeze for later and leaving the skin on your vegetables are also recommended to maximize your experience.  Cooking - Healthy Salads and Dressing Clinical staff conducted group or individual video education with verbal and written material and guidebook.  Patient learns that vegetables, fruits, whole grains, and legumes are the foundations of the Pritikin Eating Plan. Recommendations include how to incorporate each of these in flavorful and healthy salads, and how to create homemade salad dressings. Proper handling of ingredients is also covered. Cooking - Soups and [employer] - Soups and Desserts Clinical staff conducted group or individual video education with verbal and written material and guidebook.  Patient learns that Pritikin soups and desserts make for easy, nutritious, and delicious snacks and meal components that are low in sodium, fat, sugar, and calorie density, while high in vitamins, minerals, and filling fiber. Recommendations include simple and healthy ideas for soups and desserts.   Overview     The Pritikin Solution Program Overview Clinical staff conducted group or individual video education with verbal and written material and guidebook.  Patient learns that the results of the Pritikin Program have been documented in more than 100 articles published in peer-reviewed journals, and the benefits include reducing risk factors for (and, in some cases, even reversing) high cholesterol, high blood pressure, type 2 diabetes, obesity, and more! An overview of the three key pillars of the Pritikin Program will be covered: eating well, doing  regular exercise, and having a healthy mind-set.  WORKSHOPS  Exercise: Exercise Basics: Building Your Action Plan Clinical staff led group instruction and group discussion with PowerPoint presentation and patient guidebook. To enhance the learning environment the use of posters, models and videos may be added. At the conclusion of this workshop, patients will comprehend the difference between physical activity and exercise, as well as the benefits of incorporating both, into their routine. Patients will understand the FITT (Frequency, Intensity, Time, and Type) principle and how to use it to build an exercise action plan. In addition, safety concerns and other considerations for exercise and cardiac rehab will be addressed by the presenter. The purpose of this lesson is  to promote a comprehensive and effective weekly exercise routine in order to improve patients' overall level of fitness.   Managing Heart Disease: Your Path to a Healthier Heart Clinical staff led group instruction and group discussion with PowerPoint presentation and patient guidebook. To enhance the learning environment the use of posters, models and videos may be added.At the conclusion of this workshop, patients will understand the anatomy and physiology of the heart. Additionally, they will understand how Pritikin's three pillars impact the risk factors, the progression, and the management of heart disease.  The purpose of this lesson is to provide a high-level overview of the heart, heart disease, and how the Pritikin lifestyle positively impacts risk factors.  Exercise Biomechanics Clinical staff led group instruction and group discussion with PowerPoint presentation and patient guidebook. To enhance the learning environment the use of posters, models and videos may be added. Patients will learn how the structural parts of their bodies function and how these functions impact their daily activities, movement, and exercise.  Patients will learn how to promote a neutral spine, learn how to manage pain, and identify ways to improve their physical movement in order to promote healthy living. The purpose of this lesson is to expose patients to common physical limitations that impact physical activity. Participants will learn practical ways to adapt and manage aches and pains, and to minimize their effect on regular exercise. Patients will learn how to maintain good posture while sitting, walking, and lifting.  Balance Training and Fall Prevention  Clinical staff led group instruction and group discussion with PowerPoint presentation and patient guidebook. To enhance the learning environment the use of posters, models and videos may be added. At the conclusion of this workshop, patients will understand the importance of their sensorimotor skills (vision, proprioception, and the vestibular system) in maintaining their ability to balance as they age. Patients will apply a variety of balancing exercises that are appropriate for their current level of function. Patients will understand the common causes for poor balance, possible solutions to these problems, and ways to modify their physical environment in order to minimize their fall risk. The purpose of this lesson is to teach patients about the importance of maintaining balance as they age and ways to minimize their risk of falling.  WORKSHOPS   Nutrition:  Fueling a Ship broker led group instruction and group discussion with PowerPoint presentation and patient guidebook. To enhance the learning environment the use of posters, models and videos may be added. Patients will review the foundational principles of the Pritikin Eating Plan and understand what constitutes a serving size in each of the food groups. Patients will also learn Pritikin-friendly foods that are better choices when away from home and review make-ahead meal and snack options. Calorie  density will be reviewed and applied to three nutrition priorities: weight maintenance, weight loss, and weight gain. The purpose of this lesson is to reinforce (in a group setting) the key concepts around what patients are recommended to eat and how to apply these guidelines when away from home by planning and selecting Pritikin-friendly options. Patients will understand how calorie density may be adjusted for different weight management goals.  Mindful Eating  Clinical staff led group instruction and group discussion with PowerPoint presentation and patient guidebook. To enhance the learning environment the use of posters, models and videos may be added. Patients will briefly review the concepts of the Pritikin Eating Plan and the importance of low-calorie dense foods. The concept of mindful eating  will be introduced as well as the importance of paying attention to internal hunger signals. Triggers for non-hunger eating and techniques for dealing with triggers will be explored. The purpose of this lesson is to provide patients with the opportunity to review the basic principles of the Pritikin Eating Plan, discuss the value of eating mindfully and how to measure internal cues of hunger and fullness using the Hunger Scale. Patients will also discuss reasons for non-hunger eating and learn strategies to use for controlling emotional eating.  Targeting Your Nutrition Priorities Clinical staff led group instruction and group discussion with PowerPoint presentation and patient guidebook. To enhance the learning environment the use of posters, models and videos may be added. Patients will learn how to determine their genetic susceptibility to disease by reviewing their family history. Patients will gain insight into the importance of diet as part of an overall healthy lifestyle in mitigating the impact of genetics and other environmental insults. The purpose of this lesson is to provide patients with the  opportunity to assess their personal nutrition priorities by looking at their family history, their own health history and current risk factors. Patients will also be able to discuss ways of prioritizing and modifying the Pritikin Eating Plan for their highest risk areas  Menu  Clinical staff led group instruction and group discussion with PowerPoint presentation and patient guidebook. To enhance the learning environment the use of posters, models and videos may be added. Using menus brought in from [employer], or printed from [employer], patients will apply the Pritikin dining out guidelines that were presented in the [employer] video. Patients will also be able to practice these guidelines in a variety of provided scenarios. The purpose of this lesson is to provide patients with the opportunity to practice hands-on learning of the Pritikin Dining Out guidelines with actual menus and practice scenarios.  Label Reading Clinical staff led group instruction and group discussion with PowerPoint presentation and patient guidebook. To enhance the learning environment the use of posters, models and videos may be added. Patients will review and discuss the Pritikin label reading guidelines presented in Pritikin's Label Reading Educational series video. Using fool labels brought in from local grocery stores and markets, patients will apply the label reading guidelines and determine if the packaged food meet the Pritikin guidelines. The purpose of this lesson is to provide patients with the opportunity to review, discuss, and practice hands-on learning of the Pritikin Label Reading guidelines with actual packaged food labels. Cooking School  Pritikin's [employer] are designed to teach patients ways to prepare quick, simple, and affordable recipes at home. The importance of nutrition's role in chronic disease risk reduction is reflected in its emphasis in the overall  Pritikin program. By learning how to prepare essential core Pritikin Eating Plan recipes, patients will increase control over what they eat; be able to customize the flavor of foods without the use of added salt, sugar, or fat; and improve the quality of the food they consume. By learning a set of core recipes which are easily assembled, quickly prepared, and affordable, patients are more likely to prepare more healthy foods at home. These workshops focus on convenient breakfasts, simple entres, side dishes, and desserts which can be prepared with minimal effort and are consistent with nutrition recommendations for cardiovascular risk reduction. Cooking [employer] are taught by a Armed forces logistics/support/administrative officer (RD) who has been trained by the [employer]. The chef or RD has  a clear understanding of the importance of minimizing - if not completely eliminating - added fat, sugar, and sodium in recipes. Throughout the series of Cooking School Workshop sessions, patients will learn about healthy ingredients and efficient methods of cooking to build confidence in their capability to prepare    Cooking School weekly topics:  Adding Flavor- Sodium-Free  Fast and Healthy Breakfasts  Powerhouse Plant-Based Proteins  Satisfying Salads and Dressings  Simple Sides and Sauces  International Cuisine-Spotlight on the [employer] Zones  Delicious Desserts  Savory Soups  [employer] - Meals in a Astronomer Appetizers and Snacks  Comforting Weekend Breakfasts  One-Pot Wonders   Fast Evening Meals  Landscape architect Your Pritikin Plate  WORKSHOPS   Healthy Mindset (Psychosocial):  Focused Goals, Sustainable Changes Clinical staff led group instruction and group discussion with PowerPoint presentation and patient guidebook. To enhance the learning environment the use of posters, models and videos may be added. Patients will be able to apply effective goal setting strategies  to establish at least one personal goal, and then take consistent, meaningful action toward that goal. They will learn to identify common barriers to achieving personal goals and develop strategies to overcome them. Patients will also gain an understanding of how our mind-set can impact our ability to achieve goals and the importance of cultivating a positive and growth-oriented mind-set. The purpose of this lesson is to provide patients with a deeper understanding of how to set and achieve personal goals, as well as the tools and strategies needed to overcome common obstacles which may arise along the way.  From Head to Heart: The Power of a Healthy Outlook  Clinical staff led group instruction and group discussion with PowerPoint presentation and patient guidebook. To enhance the learning environment the use of posters, models and videos may be added. Patients will be able to recognize and describe the impact of emotions and mood on physical health. They will discover the importance of self-care and explore self-care practices which may work for them. Patients will also learn how to utilize the 4 C's to cultivate a healthier outlook and better manage stress and challenges. The purpose of this lesson is to demonstrate to patients how a healthy outlook is an essential part of maintaining good health, especially as they continue their cardiac rehab journey.  Healthy Sleep for a Healthy Heart Clinical staff led group instruction and group discussion with PowerPoint presentation and patient guidebook. To enhance the learning environment the use of posters, models and videos may be added. At the conclusion of this workshop, patients will be able to demonstrate knowledge of the importance of sleep to overall health, well-being, and quality of life. They will understand the symptoms of, and treatments for, common sleep disorders. Patients will also be able to identify daytime and nighttime behaviors which impact  sleep, and they will be able to apply these tools to help manage sleep-related challenges. The purpose of this lesson is to provide patients with a general overview of sleep and outline the importance of quality sleep. Patients will learn about a few of the most common sleep disorders. Patients will also be introduced to the concept of "sleep hygiene," and discover ways to self-manage certain sleeping problems through simple daily behavior changes. Finally, the workshop will motivate patients by clarifying the links between quality sleep and their goals of heart-healthy living.   Recognizing and Reducing Stress Clinical staff led group instruction and group discussion with PowerPoint presentation and patient guidebook.  To enhance the learning environment the use of posters, models and videos may be added. At the conclusion of this workshop, patients will be able to understand the types of stress reactions, differentiate between acute and chronic stress, and recognize the impact that chronic stress has on their health. They will also be able to apply different coping mechanisms, such as reframing negative self-talk. Patients will have the opportunity to practice a variety of stress management techniques, such as deep abdominal breathing, progressive muscle relaxation, and/or guided imagery.  The purpose of this lesson is to educate patients on the role of stress in their lives and to provide healthy techniques for coping with it.  Learning Barriers/Preferences:  Learning Barriers/Preferences - 08/23/23 1032       Learning Barriers/Preferences   Learning Barriers Hearing;Sight   wears glasses and bilateral hearing aids   Learning Preferences Audio;Group Instruction;Individual Instruction;Skilled Demonstration;Verbal Instruction             Education Topics:  Knowledge Questionnaire Score:  Knowledge Questionnaire Score - 08/23/23 1032       Knowledge Questionnaire Score   Pre Score 27/28              Core Components/Risk Factors/Patient Goals at Admission:  Personal Goals and Risk Factors at Admission - 08/23/23 1016       Core Components/Risk Factors/Patient Goals on Admission    Weight Management Yes;Weight Loss    Intervention Weight Management: Develop a combined nutrition and exercise program designed to reach desired caloric intake, while maintaining appropriate intake of nutrient and fiber, sodium and fats, and appropriate energy expenditure required for the weight goal.;Weight Management: Provide education and appropriate resources to help participant work on and attain dietary goals.;Weight Management/Obesity: Establish reasonable short term and long term weight goals.    Admit Weight 211 lb 3.2 oz (95.8 kg)    Goal Weight: Short Term 196 lb (88.9 kg)    Expected Outcomes Short Term: Continue to assess and modify interventions until short term weight is achieved;Long Term: Adherence to nutrition and physical activity/exercise program aimed toward attainment of established weight goal;Weight Loss: Understanding of general recommendations for a balanced deficit meal plan, which promotes 1-2 lb weight loss per week and includes a negative energy balance of [phone] kcal/d;Understanding recommendations for meals to include 15-35% energy as protein, 25-35% energy from fat, 35-60% energy from carbohydrates, less than 200mg  of dietary cholesterol, 20-35 gm of total fiber daily;Understanding of distribution of calorie intake throughout the day with the consumption of 4-5 meals/snacks    Tobacco Cessation Yes    Number of packs per day 0    Intervention Assist the participant in steps to quit. Provide individualized education and counseling about committing to Tobacco Cessation, relapse prevention, and pharmacological support that can be provided by physician.;Education officer, environmental, assist with locating and accessing local/national Quit Smoking programs, and support quit  date choice.   Pt quit smoking 06/27/23   Expected Outcomes Long Term: Complete abstinence from all tobacco products for at least 12 months from quit date.    Hypertension Yes    Intervention Provide education on lifestyle modifcations including regular physical activity/exercise, weight management, moderate sodium restriction and increased consumption of fresh fruit, vegetables, and low fat dairy, alcohol moderation, and smoking cessation.;Monitor prescription use compliance.    Expected Outcomes Short Term: Continued assessment and intervention until BP is < 140/82mm HG in hypertensive participants. < 130/48mm HG in hypertensive participants with diabetes, heart failure or chronic kidney disease.;Long Term:  Maintenance of blood pressure at goal levels.    Lipids Yes    Intervention Provide education and support for participant on nutrition & aerobic/resistive exercise along with prescribed medications to achieve LDL 70mg , HDL >40mg .    Expected Outcomes Short Term: Participant states understanding of desired cholesterol values and is compliant with medications prescribed. Participant is following exercise prescription and nutrition guidelines.;Long Term: Cholesterol controlled with medications as prescribed, with individualized exercise RX and with personalized nutrition plan. Value goals: LDL < 70mg , HDL > 40 mg.             Core Components/Risk Factors/Patient Goals Review:   Goals and Risk Factor Review     Row Name 09/27/23 1342 10/21/23 1254 11/15/23 1628         Core Components/Risk Factors/Patient Goals Review   Personal Goals Review Weight Management/Obesity;Hypertension;Lipids;Tobacco Cessation Weight Management/Obesity;Hypertension;Lipids;Tobacco Cessation Weight Management/Obesity;Hypertension;Lipids;Tobacco Cessation     Review [employer] Abbot is doing well with exercise. Vital signs have been stable. [employer]bot has lost 2.3 kg since starting cardiac rehab. [employer] Abbot is doing well with exercise.  Vital signs have been stable. Abbott Abbot has lost 4.1 kg since starting cardiac rehab. Abbott Abbot says he is using a lot of nicotene gum, Will continue to encourage smoking cessation Abbott Abbot is doing well with exercise. Vital signs have been stable. Abbott Abbot has lost 3.0  kg since starting cardiac rehab. Abbott Abbot will complete cardiac rehab on 11/18/23.     Expected Outcomes Abbott Abbot will continue to participate in cardiac rehab for exercise, nutrition and lifestyle modifications. Abbott Abbot will continue to participate in cardiac rehab for exercise, nutrition and lifestyle modifications. Abbott Abbot will continue to participate in cardiac rehab for exercise, nutrition and lifestyle modifications.              Core Components/Risk Factors/Patient Goals at Discharge (Final Review):   Goals and Risk Factor Review - 11/15/23 1628       Core Components/Risk Factors/Patient Goals Review   Personal Goals Review Weight Management/Obesity;Hypertension;Lipids;Tobacco Cessation    Review Abbott Abbot is doing well with exercise. Vital signs have been stable. Abbott Abbot has lost 3.0  kg since starting cardiac rehab. Abbott Abbot will complete cardiac rehab on 11/18/23.    Expected Outcomes Abbott Abbot will continue to participate in cardiac rehab for exercise, nutrition and lifestyle modifications.             ITP Comments:  ITP Comments     Row Name 08/23/23 1008 08/29/23 1715 09/27/23 1337 10/21/23 1252 11/15/23 1622   ITP Comments Gaylyn Keas, MD: Medical Director.  Introduction to the [facility]/Intensive Cardiac Rehab.  Initial orientation packet reviewed witht he patient. 30 Day ITP Review. Vinny started cardiac rehab on 08/29/23. Vinney did well with exercise. 30 Day ITP Review. Abbott Abbot has good attendance and participation with exercise at cardiac rehab 30 Day ITP Review. Abbott Abbot continues to have good attendance and participation with exercise at cardiac rehab 30 Day ITP Review. Abbott Abbot continues to have good attendance and  participation with exercise at cardiac rehab. Abbott Abbot will complete cardiac rehab on 11/18/23.            Comments: See ITP comments.Monte Antonio RN BSN

## 2023-11-16 ENCOUNTER — Ambulatory Visit (HOSPITAL_COMMUNITY): Payer: BC Managed Care – PPO

## 2023-11-16 ENCOUNTER — Encounter (HOSPITAL_COMMUNITY)
Admission: RE | Admit: 2023-11-16 | Discharge: 2023-11-16 | Disposition: A | Discharge status: Home / Self Care | Payer: BC Managed Care – PPO | Source: Ambulatory Visit | Attending: Cardiology | Admitting: Cardiology

## 2023-11-16 DIAGNOSIS — Z955 Presence of coronary angioplasty implant and graft: Secondary | ICD-10-CM | POA: Diagnosis not present

## 2023-11-16 DIAGNOSIS — I2111 ST elevation (STEMI) myocardial infarction involving right coronary artery: Secondary | ICD-10-CM | POA: Diagnosis not present

## 2023-11-16 NOTE — Progress Notes (Signed)
 Discharge Progress Report  Patient Details  Name: Tommy Page MRN: [id number] Date of Birth: 05-12-1961 Referring Provider:   Flowsheet Row INTENSIVE CARDIAC REHAB ORIENT from 08/23/2023 in [facility]  Referring Provider Dr. Jac Martes, MD covering)        Number of Visits: 23  Reason for Discharge:  Patient reached a stable level of exercise. Patient independent in their exercise. Patient has met program and personal goals.  Smoking History:  Social History   Tobacco Use  Smoking Status Every Day   Current packs/day: 0.00   Average packs/day: 1 pack/day for 36.0 years (36.0 ttl pk-yrs)   Types: Cigarettes   Start date: 06/26/1979   Last attempt to quit: 06/26/2015   Years since quitting: 8.4  Smokeless Tobacco Never    Diagnosis:  06/27/23 ST elevation myocardial infarction involving right coronary artery (HCC)  06/27/23 Status post coronary artery stent placement  ADL [facility]:   Initial Exercise Prescription:  Initial Exercise Prescription - 08/23/23 1000       Date of Initial Exercise RX and Referring Provider   Date 08/23/23    Referring Provider Dr. Jac Martes, MD covering)    Expected Discharge Date 11/16/23      Recumbant Bike   Level 2    RPM 60    Watts 25    Minutes 15    METs 4.2      Recumbant Elliptical   Level 2    RPM 60    Watts 50    Minutes 15    METs 4.2      Prescription Details   Frequency (times per week) 3    Duration Progress to 30 minutes of continuous aerobic without signs/symptoms of physical distress      Intensity   THRR 40-80% of Max Heartrate 64-127    Ratings of Perceived Exertion 11-13    Perceived Dyspnea 0-4      Progression   Progression Continue progressive overload as per policy without signs/symptoms or physical distress.      Resistance Training   Training Prescription Yes    Weight 4 lbs    Reps 10-15              Discharge Exercise Prescription (Final Exercise Prescription Changes):  Exercise Prescription Changes - 11/16/23 1645       Response to Exercise   Blood Pressure (Admit) 112/72    Blood Pressure (Exit) 108/68    Heart Rate (Admit) 80 bpm    Heart Rate (Exercise) 132 bpm    Heart Rate (Exit) 89 bpm    Rating of Perceived Exertion (Exercise) 10.5    Perceived Dyspnea (Exercise) 0    Symptoms 0    Comments Pt graduated the [employer] program    Duration Progress to 30 minutes of  aerobic without signs/symptoms of physical distress    Intensity THRR unchanged      Progression   Progression Continue to progress workloads to maintain intensity without signs/symptoms of physical distress.    Average METs 6.32      Resistance Training   Training Prescription No    Weight 6 lbs wts    Reps 10-15    Time 10 Minutes      Recumbant Elliptical   Level 5    RPM 95    Watts 151    Minutes 15    METs 6.3      Rower   Level  2    Watts 77    Minutes 15    METs 6.34      Home Exercise Plan   Plans to continue exercise at Home (comment)    Frequency Add 2 additional days to program exercise sessions.    Initial Home Exercises Provided 09/12/23             Functional Capacity:  6 Minute Walk     Row Name 08/23/23 0929 11/14/23 1652       6 Minute Walk   Phase Initial Discharge    Distance 1755 feet 2049 feet    Distance % Change -- 16.75 %    Distance Feet Change -- 294 ft    Walk Time 6 minutes 6 minutes    # of Rest Breaks 0 0    MPH 3.32 3.88    METS 4.2 4.91    RPE 8 8    Perceived Dyspnea  0 0    VO2 Peak 14.7 17.18    Symptoms No Yes (comment)    Comments -- chronic back pain 4/10. does not resolve with rest    Resting HR 77 bpm 56 bpm    Resting BP 112/70 104/72    Resting Oxygen Saturation  96 % --    Exercise Oxygen Saturation  during 6 min walk 97 % --    Max Ex. HR 95 bpm 102 bpm    Max Ex. BP 138/72 148/70    2 Minute Post BP 112/68 120/64              Psychological, QOL, Others - Outcomes: PHQ 2/9:    11/16/2023    3:23 PM 08/23/2023   10:10 AM  Depression screen PHQ 2/9  Decreased Interest 0 2  Down, Depressed, Hopeless 0 0  PHQ - 2 Score 0 2  Altered sleeping 0 2  Tired, decreased energy 0 1  Change in appetite 0 0  Feeling bad or failure about yourself  0 0  Trouble concentrating 0 0  Moving slowly or fidgety/restless 0 0  Suicidal thoughts 0 0  PHQ-9 Score 0 5  Difficult doing work/chores Not difficult at all Not difficult at all    Quality of Life:  Quality of Life - 11/16/23 1644       Quality of Life   Select Quality of Life      Quality of Life Scores   Health/Function Post 25.03 %    Socioeconomic Post 23.43 %    Psych/Spiritual Post 22.36 %    Family Post 30 %    GLOBAL Post 24.2 %             Personal Goals: Goals established at orientation with interventions provided to work toward goal.  Personal Goals and Risk Factors at Admission - 08/23/23 1016       Core Components/Risk Factors/Patient Goals on Admission    Weight Management Yes;Weight Loss    Intervention Weight Management: Develop a combined nutrition and exercise program designed to reach desired caloric intake, while maintaining appropriate intake of nutrient and fiber, sodium and fats, and appropriate energy expenditure required for the weight goal.;Weight Management: Provide education and appropriate resources to help participant work on and attain dietary goals.;Weight Management/Obesity: Establish reasonable short term and long term weight goals.    Admit Weight 211 lb 3.2 oz (95.8 kg)    Goal Weight: Short Term 196 lb (88.9 kg)    Expected Outcomes Short Term: Continue to assess  and modify interventions until short term weight is achieved;Long Term: Adherence to nutrition and physical activity/exercise program aimed toward attainment of established weight goal;Weight Loss: Understanding of general recommendations for a  balanced deficit meal plan, which promotes 1-2 lb weight loss per week and includes a negative energy balance of [phone] kcal/d;Understanding recommendations for meals to include 15-35% energy as protein, 25-35% energy from fat, 35-60% energy from carbohydrates, less than 200mg  of dietary cholesterol, 20-35 gm of total fiber daily;Understanding of distribution of calorie intake throughout the day with the consumption of 4-5 meals/snacks    Tobacco Cessation Yes    Number of packs per day 0    Intervention Assist the participant in steps to quit. Provide individualized education and counseling about committing to Tobacco Cessation, relapse prevention, and pharmacological support that can be provided by physician.;Education officer, environmental, assist with locating and accessing local/national Quit Smoking programs, and support quit date choice.   Pt quit smoking 06/27/23   Expected Outcomes Long Term: Complete abstinence from all tobacco products for at least 12 months from quit date.    Hypertension Yes    Intervention Provide education on lifestyle modifcations including regular physical activity/exercise, weight management, moderate sodium restriction and increased consumption of fresh fruit, vegetables, and low fat dairy, alcohol moderation, and smoking cessation.;Monitor prescription use compliance.    Expected Outcomes Short Term: Continued assessment and intervention until BP is < 140/57mm HG in hypertensive participants. < 130/65mm HG in hypertensive participants with diabetes, heart failure or chronic kidney disease.;Long Term: Maintenance of blood pressure at goal levels.    Lipids Yes    Intervention Provide education and support for participant on nutrition & aerobic/resistive exercise along with prescribed medications to achieve LDL 70mg , HDL >40mg .    Expected Outcomes Short Term: Participant states understanding of desired cholesterol values and is compliant with medications prescribed.  Participant is following exercise prescription and nutrition guidelines.;Long Term: Cholesterol controlled with medications as prescribed, with individualized exercise RX and with personalized nutrition plan. Value goals: LDL < 70mg , HDL > 40 mg.              Personal Goals Discharge:  Goals and Risk Factor Review     Row Name 09/27/23 1342 10/21/23 1254 11/15/23 1628         Core Components/Risk Factors/Patient Goals Review   Personal Goals Review Weight Management/Obesity;Hypertension;Lipids;Tobacco Cessation Weight Management/Obesity;Hypertension;Lipids;Tobacco Cessation Weight Management/Obesity;Hypertension;Lipids;Tobacco Cessation     Review Abbott Abbot is doing well with exercise. Vital signs have been stable. Abbott Abbot has lost 2.3 kg since starting cardiac rehab. Abbott Abbot is doing well with exercise. Vital signs have been stable. Abbott Abbot has lost 4.1 kg since starting cardiac rehab. Abbott Abbot says he is using a lot of nicotene gum, Will continue to encourage smoking cessation Abbott Abbot is doing well with exercise. Vital signs have been stable. Abbott Abbot has lost 3.0  kg since starting cardiac rehab. Abbott Abbot will complete cardiac rehab on 11/18/23.     Expected Outcomes Abbott Abbot will continue to participate in cardiac rehab for exercise, nutrition and lifestyle modifications. Abbott Abbot will continue to participate in cardiac rehab for exercise, nutrition and lifestyle modifications. Abbott Abbot will continue to participate in cardiac rehab for exercise, nutrition and lifestyle modifications.              Exercise Goals and Review:  Exercise Goals     Row Name 08/23/23 1038             Exercise Goals   Increase Physical Activity Yes  Intervention Provide advice, education, support and counseling about physical activity/exercise needs.;Develop an individualized exercise prescription for aerobic and resistive training based on initial evaluation findings, risk stratification, comorbidities and participant's  personal goals.       Expected Outcomes Short Term: Attend rehab on a regular basis to increase amount of physical activity.;Long Term: Exercising regularly at least 3-5 days a week.;Long Term: Add in home exercise to make exercise part of routine and to increase amount of physical activity.       Increase Strength and Stamina Yes       Intervention Provide advice, education, support and counseling about physical activity/exercise needs.;Develop an individualized exercise prescription for aerobic and resistive training based on initial evaluation findings, risk stratification, comorbidities and participant's personal goals.       Expected Outcomes Short Term: Increase workloads from initial exercise prescription for resistance, speed, and METs.;Short Term: Perform resistance training exercises routinely during rehab and add in resistance training at home;Long Term: Improve cardiorespiratory fitness, muscular endurance and strength as measured by increased METs and functional capacity ( )       Able to understand and use rate of perceived exertion (RPE) scale Yes       Intervention Provide education and explanation on how to use RPE scale       Expected Outcomes Short Term: Able to use RPE daily in rehab to express subjective intensity level;Long Term:  Able to use RPE to guide intensity level when exercising independently       Knowledge and understanding of Target Heart Rate Range (THRR) Yes       Intervention Provide education and explanation of THRR including how the numbers were predicted and where they are located for reference       Expected Outcomes Short Term: Able to state/look up THRR;Long Term: Able to use THRR to govern intensity when exercising independently;Short Term: Able to use daily as guideline for intensity in rehab       Understanding of Exercise Prescription Yes       Intervention Provide education, explanation, and written materials on patient's individual exercise prescription        Expected Outcomes Short Term: Able to explain program exercise prescription;Long Term: Able to explain home exercise prescription to exercise independently                Exercise Goals Re-Evaluation:  Exercise Goals Re-Evaluation     Row Name 08/29/23 1630 09/12/23 1627 10/14/23 1628 11/16/23 1647       Exercise Goal Re-Evaluation   Exercise Goals Review Increase Physical Activity;Understanding of Exercise Prescription;Increase Strength and Stamina;Knowledge and understanding of Target Heart Rate Range (THRR);Able to understand and use rate of perceived exertion (RPE) scale Increase Physical Activity;Understanding of Exercise Prescription;Increase Strength and Stamina;Knowledge and understanding of Target Heart Rate Range (THRR);Able to understand and use rate of perceived exertion (RPE) scale Increase Physical Activity;Understanding of Exercise Prescription;Increase Strength and Stamina;Knowledge and understanding of Target Heart Rate Range (THRR);Able to understand and use rate of perceived exertion (RPE) scale Increase Physical Activity;Understanding of Exercise Prescription;Increase Strength and Stamina;Knowledge and understanding of Target Heart Rate Range (THRR);Able to understand and use rate of perceived exertion (RPE) scale    Comments Pt first day in the [facility] program. Pt tolerated exercise well with an average MET level of 3.3. He is off to a good start and is learning his THRR, RPE and ExRx Reviewed MET's, goals and home ExRx. Pt tolerated exercise well with an average MET level  of 4.35. He is already progressing MET's and WL's and feels good with his exercie routine. He feels good about his goals and has been working on weight loss since the summer and is doing well, he states he's having a hard time with heart healthy eating, but is making changes. Reviewed MET's and goals. Pt tolerated exercise well with an average MET level of 4.85. He is doing well, some issues with  quad muscle tighness and occasional pain, but still feels good with exercise routine. He's been working on more heart healthy choices and is working with the RD to make some changes. He also states he had better control of his SOB Reviewed MET's and goals. Pt tolerated exercise well with an average MET level of 6.32. He did very well in the program and increased strength, stamina and WL's. He increased on his post by 252ft for a total of 204ft. He will continue to exercise on his own by going to [employer], stretches and golf 30-60 mins, 5-7 days    Expected Outcomes Will continue to monitor pt and progress workloads as tolerated without sign or symptom Will continue to monitor pt and progress workloads as tolerated without sign or symptom Will continue to monitor pt and progress workloads as tolerated without sign or symptom Will continue to monitor pt and progress workloads as tolerated without sign or symptom             Nutrition & Weight - Outcomes:  Pre Biometrics - 08/23/23 0822       Pre Biometrics   Waist Circumference 42.5 inches    Hip Circumference 42.5 inches    Waist to Hip Ratio 1 %    Triceps Skinfold 13 mm    % Body Fat 27.9 %    Grip Strength 44 kg    Flexibility 12 in    Single Leg Stand 30 seconds             Post Biometrics - 11/14/23 1653        Post  Biometrics   Height 6' (1.829 m)    Weight 92.8 kg    Waist Circumference 39.25 inches    Hip Circumference 40 inches    Waist to Hip Ratio 0.98 %    BMI (Calculated) 27.74    Triceps Skinfold 13 mm    % Body Fat 26 %    Grip Strength 45 kg    Flexibility 14 in    Single Leg Stand 30 seconds             Nutrition:  Nutrition Therapy & Goals - 11/16/23 1034       Nutrition Therapy   Diet Heart Healthy Diet    Drug/Food Interactions Statins/Certain Fruits      Personal Nutrition Goals   Nutrition Goal Patient to identify strategies for reducing cardiovascular risk by attending the  [facility] education and nutrition series weekly.   goal met.   Personal Goal #2 Patient to improve diet quality by using the plate method as a guide for meal planning to include lean protein/plant protein, fruits, vegetables, whole grains, nonfat dairy as part of a well-balanced diet.   goal in progress.   Personal Goal #3 Patient to identify strategies for weight loss of 0.5-2.0# per week.   goal in progress.   Comments Goals in progress. Abbott Abbot has medical history of STEMI. He has attended the [employer] education and nutrition series regularly. Lipids/ LDL have improved but are not at  goal; he continues crestor and diet modification. He is down 8.8# since starting with our program. He has quit smoking. He has drastically reduced eating out/fast food and is cooking at home regularly. He has increased dietary fiber, decreased saturated fat, and decreased sodium. Patient will continue to benefit from adherence to nutrition, exercise, and lifestyle modification recommendations.      Intervention Plan   Intervention Prescribe, educate and counsel regarding individualized specific dietary modifications aiming towards targeted core components such as weight, hypertension, lipid management, diabetes, heart failure and other comorbidities.;Nutrition handout(s) given to patient.    Expected Outcomes Short Term Goal: Understand basic principles of dietary content, such as calories, fat, sodium, cholesterol and nutrients.;Long Term Goal: Adherence to prescribed nutrition plan.             Nutrition Discharge:  Nutrition Assessments - 11/16/23 1645       Rate Your Plate Scores   Post Score 81             Education Questionnaire Score:  Knowledge Questionnaire Score - 11/16/23 1647       Knowledge Questionnaire Score   Post Score 24/28             Goals reviewed with patient; copy given to patient.Pt graduates from  Intensive/Traditional cardiac rehab program on 10/27/23  with completion of   63 exercise and education sessions. Pt maintained good attendance and progressed nicely during their participation in rehab as evidenced by increased MET level. Vinny increased his distance on his post exercise walk test by 294 feet. Vinny lost 4.1 kg while enrolled in the program. Abbott Abbot continue to chew a lot of nicotine  gum and says he is not smoking cigarettes anymore.  Medication list reconciled. Repeat  PHQ score- 0 .  Pt has made significant lifestyle changes and should be commended for their success. Vinny  achieved their goals during cardiac rehab.   Pt plans to continue exercise at [employer] 3 days a week, resistance training, stretching and playing golf. We are proud of Vinny's progress!Monte Antonio RN BSN

## 2023-11-18 ENCOUNTER — Ambulatory Visit (HOSPITAL_COMMUNITY)

## 2023-11-18 ENCOUNTER — Inpatient Hospital Stay (HOSPITAL_COMMUNITY): Admission: RE | Admit: 2023-11-18 | Source: Ambulatory Visit

## 2023-11-18 ENCOUNTER — Telehealth (HOSPITAL_COMMUNITY): Payer: Self-pay

## 2023-11-18 NOTE — Telephone Encounter (Signed)
 Patient called out for last class due to work schedule

## 2023-11-29 DIAGNOSIS — I252 Old myocardial infarction: Secondary | ICD-10-CM | POA: Diagnosis not present

## 2023-11-29 DIAGNOSIS — Z125 Encounter for screening for malignant neoplasm of prostate: Secondary | ICD-10-CM | POA: Diagnosis not present

## 2023-11-29 DIAGNOSIS — E119 Type 2 diabetes mellitus without complications: Secondary | ICD-10-CM | POA: Diagnosis not present

## 2023-11-29 DIAGNOSIS — I2510 Atherosclerotic heart disease of native coronary artery without angina pectoris: Secondary | ICD-10-CM | POA: Diagnosis not present

## 2023-11-29 DIAGNOSIS — E785 Hyperlipidemia, unspecified: Secondary | ICD-10-CM | POA: Diagnosis not present
